# Patient Record
Sex: Female | Born: 1980
Health system: Southern US, Community
[De-identification: ages and names within clinical notes are randomized; demographics above are authoritative.]

## PROBLEM LIST (undated history)

## (undated) DIAGNOSIS — F319 Bipolar disorder, unspecified: Secondary | ICD-10-CM

## (undated) DIAGNOSIS — R519 Headache, unspecified: Secondary | ICD-10-CM

## (undated) DIAGNOSIS — G244 Idiopathic orofacial dystonia: Secondary | ICD-10-CM

## (undated) DIAGNOSIS — O24419 Gestational diabetes mellitus in pregnancy, unspecified control: Secondary | ICD-10-CM

## (undated) DIAGNOSIS — F419 Anxiety disorder, unspecified: Secondary | ICD-10-CM

## (undated) DIAGNOSIS — I10 Essential (primary) hypertension: Secondary | ICD-10-CM

## (undated) DIAGNOSIS — F32A Depression, unspecified: Secondary | ICD-10-CM

## (undated) DIAGNOSIS — F329 Major depressive disorder, single episode, unspecified: Secondary | ICD-10-CM

## (undated) HISTORY — DX: Anxiety disorder, unspecified: F41.9

## (undated) HISTORY — PX: BREAST SURGERY: SHX581

## (undated) HISTORY — DX: Depression, unspecified: F32.A

## (undated) HISTORY — DX: Bipolar disorder, unspecified: F31.9

## (undated) HISTORY — DX: Gestational diabetes mellitus in pregnancy, unspecified control: O24.419

## (undated) HISTORY — DX: Idiopathic orofacial dystonia: G24.4

## (undated) HISTORY — DX: Major depressive disorder, single episode, unspecified: F32.9

## (undated) HISTORY — PX: REDUCTION MAMMAPLASTY: SUR839

---

## 2013-04-09 DIAGNOSIS — E7849 Other hyperlipidemia: Secondary | ICD-10-CM | POA: Diagnosis present

## 2013-04-09 DIAGNOSIS — I10 Essential (primary) hypertension: Secondary | ICD-10-CM | POA: Diagnosis present

## 2014-12-09 DIAGNOSIS — D573 Sickle-cell trait: Secondary | ICD-10-CM | POA: Insufficient documentation

## 2015-03-02 ENCOUNTER — Other Ambulatory Visit (HOSPITAL_COMMUNITY): Payer: Self-pay | Admitting: Obstetrics and Gynecology

## 2015-03-02 DIAGNOSIS — R772 Abnormality of alphafetoprotein: Secondary | ICD-10-CM

## 2015-03-02 DIAGNOSIS — O09522 Supervision of elderly multigravida, second trimester: Secondary | ICD-10-CM

## 2015-03-02 DIAGNOSIS — Z3A23 23 weeks gestation of pregnancy: Secondary | ICD-10-CM

## 2015-03-02 DIAGNOSIS — Z3689 Encounter for other specified antenatal screening: Secondary | ICD-10-CM

## 2015-03-09 ENCOUNTER — Encounter (HOSPITAL_COMMUNITY): Payer: Self-pay | Admitting: Family Medicine

## 2015-03-16 ENCOUNTER — Ambulatory Visit (HOSPITAL_COMMUNITY): Admission: RE | Admit: 2015-03-16 | Payer: Medicaid Other | Source: Ambulatory Visit

## 2015-03-16 ENCOUNTER — Ambulatory Visit (HOSPITAL_COMMUNITY): Payer: Medicaid Other | Attending: Obstetrics and Gynecology

## 2015-03-22 ENCOUNTER — Encounter: Payer: Medicaid Other | Attending: Family Medicine

## 2015-05-20 DIAGNOSIS — O280 Abnormal hematological finding on antenatal screening of mother: Secondary | ICD-10-CM | POA: Insufficient documentation

## 2015-07-21 ENCOUNTER — Inpatient Hospital Stay (HOSPITAL_COMMUNITY): Admission: RE | Admit: 2015-07-21 | Payer: Self-pay | Source: Ambulatory Visit | Admitting: Obstetrics and Gynecology

## 2017-02-10 ENCOUNTER — Ambulatory Visit (INDEPENDENT_AMBULATORY_CARE_PROVIDER_SITE_OTHER): Payer: 59 | Admitting: Behavioral Health

## 2017-02-10 DIAGNOSIS — F259 Schizoaffective disorder, unspecified: Secondary | ICD-10-CM

## 2017-02-13 ENCOUNTER — Ambulatory Visit: Payer: Self-pay | Admitting: Behavioral Health
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
## 2017-02-20 ENCOUNTER — Ambulatory Visit (INDEPENDENT_AMBULATORY_CARE_PROVIDER_SITE_OTHER): Payer: 59 | Admitting: Licensed Clinical Social Worker

## 2017-02-20 DIAGNOSIS — F259 Schizoaffective disorder, unspecified: Secondary | ICD-10-CM

## 2017-03-06 ENCOUNTER — Ambulatory Visit: Payer: 59 | Admitting: Licensed Clinical Social Worker

## 2017-03-06 NOTE — Progress Notes (Signed)
   THERAPIST PROGRESS NOTE  Session Time: 60min  Participation Level: Active  Behavioral Response: CasualAlertAnxious  Type of Therapy: Individual Therapy  Treatment Goals addressed: Coping  Interventions: CBT, Supportive and Reframing  Summary: Kaylee Hill is a 36 y.o. female who presents with continued symptoms of her diagnosis.   LCSW discussed what psychotherapy is and is not and the importance of the therapeutic relationship to include open and honest communication between client and therapist and building trust.  Reviewed advantages and disadvantages of the therapeutic process and limitations to the therapeutic relationship including LCSW's role in maintaining the safety of the client, others and those in client's care.  Assisted Patient with developing her treatment plan.  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  She believes that her co workers are talking about her performance.   Suicidal/Homicidal: No  Therapist Response:  Clinician applied active listening skills to assist Patient develop her treatment plan.  Plan: Return again in 2 weeks.  Diagnosis: Axis I: Schizoaffective Disorder    Axis II: No diagnosis    Marinda Elkicole M Peacock, LCSW 02/21/2017

## 2017-03-24 ENCOUNTER — Ambulatory Visit: Payer: 59 | Admitting: Licensed Clinical Social Worker

## 2017-04-27 ENCOUNTER — Encounter: Payer: Self-pay | Admitting: Emergency Medicine

## 2017-04-27 ENCOUNTER — Emergency Department
Admission: EM | Admit: 2017-04-27 | Discharge: 2017-04-28 | Disposition: A | Discharge status: Other Acute Inpatient Hospital | Payer: 59 | Attending: Emergency Medicine | Admitting: Emergency Medicine

## 2017-04-27 DIAGNOSIS — F319 Bipolar disorder, unspecified: Secondary | ICD-10-CM

## 2017-04-27 DIAGNOSIS — R45851 Suicidal ideations: Secondary | ICD-10-CM | POA: Diagnosis not present

## 2017-04-27 DIAGNOSIS — F209 Schizophrenia, unspecified: Secondary | ICD-10-CM | POA: Insufficient documentation

## 2017-04-27 DIAGNOSIS — I10 Essential (primary) hypertension: Secondary | ICD-10-CM | POA: Diagnosis not present

## 2017-04-27 DIAGNOSIS — F329 Major depressive disorder, single episode, unspecified: Secondary | ICD-10-CM | POA: Diagnosis present

## 2017-04-27 HISTORY — DX: Essential (primary) hypertension: I10

## 2017-04-27 LAB — COMPREHENSIVE METABOLIC PANEL
ALBUMIN: 3.9 g/dL (ref 3.5–5.0)
ALT: 12 U/L — ABNORMAL LOW (ref 14–54)
ANION GAP: 7 (ref 5–15)
AST: 20 U/L (ref 15–41)
Alkaline Phosphatase: 85 U/L (ref 38–126)
BILIRUBIN TOTAL: 0.4 mg/dL (ref 0.3–1.2)
BUN: 8 mg/dL (ref 6–20)
CO2: 25 mmol/L (ref 22–32)
CREATININE: 0.5 mg/dL (ref 0.44–1.00)
Calcium: 9.5 mg/dL (ref 8.9–10.3)
Chloride: 107 mmol/L (ref 101–111)
GFR calc Af Amer: >60 mL/min (ref >60)
Glucose, Bld: 106 mg/dL — ABNORMAL HIGH (ref 65–99)
Potassium: 3.8 mmol/L (ref 3.5–5.1)
Sodium: 139 mmol/L (ref 135–145)
Total Protein: 7.6 g/dL (ref 6.5–8.1)

## 2017-04-27 LAB — CBC
HEMATOCRIT: 34.8 % — AB (ref 35.0–47.0)
HEMOGLOBIN: 11.5 g/dL — AB (ref 12.0–16.0)
MCH: 22.8 pg — ABNORMAL LOW (ref 26.0–34.0)
MCHC: 33 g/dL (ref 32.0–36.0)
MCV: 69 fL — ABNORMAL LOW (ref 80.0–100.0)
Platelets: 272 10*3/uL (ref 150–440)
RBC: 5.04 MIL/uL (ref 3.80–5.20)
RDW: 17.7 % — AB (ref 11.5–14.5)
WBC: 7.3 10*3/uL (ref 3.6–11.0)

## 2017-04-27 LAB — ACETAMINOPHEN LEVEL

## 2017-04-27 LAB — URINE DRUG SCREEN, QUALITATIVE (ARMC ONLY)
AMPHETAMINES, UR SCREEN: NOT DETECTED
BARBITURATES, UR SCREEN: NOT DETECTED
BENZODIAZEPINE, UR SCRN: NOT DETECTED
COCAINE METABOLITE, UR ~~LOC~~: NOT DETECTED
Cannabinoid 50 Ng, Ur ~~LOC~~: NOT DETECTED
MDMA (Ecstasy)Ur Screen: NOT DETECTED
METHADONE SCREEN, URINE: NOT DETECTED
Opiate, Ur Screen: NOT DETECTED
Phencyclidine (PCP) Ur S: NOT DETECTED
TRICYCLIC, UR SCREEN: NOT DETECTED

## 2017-04-27 LAB — SALICYLATE LEVEL: Salicylate Lvl: <7 mg/dL (ref 2.8–30.0)

## 2017-04-27 LAB — POCT PREGNANCY, URINE: Preg Test, Ur: NEGATIVE

## 2017-04-27 LAB — ETHANOL

## 2017-04-27 NOTE — ED Triage Notes (Signed)
Pt states history of bipolar with psychosis. Pt states she is not taking any medication and has difficulty obtaining follow up with psychiatry. Pt states she woke up feeling like she wanted to cut herself. Pt's husband is concerned pt will harm self and is concerned "she had a suicide attempt". Pt denies current suicidal ideation, but then states she felt like harming herself today.

## 2017-04-27 NOTE — ED Notes (Signed)
Pt belongings include shirt, skirt, a pair of shoes and a brown hair clip. Pt belongings are secured in the BHU lockers by this tech.

## 2017-04-27 NOTE — ED Provider Notes (Signed)
[facility] Emergency Department Provider Note   ____________________________________________   I have reviewed the triage vital signs and the nursing notes.   HISTORY  Chief Complaint Psychiatric Evaluation   History limited by: Not Limited   HPI Kaylee Hill is a 36 y.o. female who presents to the emergency department today because of concern for thoughts of self harm. The patient states that she has a history of sleep paralysis. This morning when she woke up she felt like she was seeing hades putting a collar on her. She felt pain with this vision. After she was able to move the patient went to the kitchen and grabbed a knife. Her intent was to end the pain. The husband had to grab the knife from her. By the time of my exam the patient denies any suicidal thoughts or current thoughts of self harm.   Past Medical History:  Diagnosis Date  . Hypertension     There are no active problems to display for this patient.   History reviewed. No pertinent surgical history.  Prior to Admission medications   Not on File    Allergies Patient has no known allergies.  History reviewed. No pertinent family history.  Social History Social History  Substance Use Topics  . Smoking status: Never Smoker  . Smokeless tobacco: Never Used  . Alcohol use No    Review of Systems Constitutional: No fever/chills Eyes: No visual changes. ENT: No sore throat. Cardiovascular: Denies chest pain. Respiratory: Denies shortness of breath. Gastrointestinal: No abdominal pain.  No nausea, no vomiting.  No diarrhea.   Genitourinary: Negative for dysuria. Musculoskeletal: Negative for back pain. Skin: Negative for rash. Neurological: Negative for headaches, focal weakness or numbness.  ____________________________________________   PHYSICAL EXAM:  VITAL SIGNS: ED Triage Vitals  Enc Vitals Group     BP 04/27/17 2022 (!) 164/111     Pulse Rate 04/27/17 2022 (!)  101     Resp 04/27/17 2022 18     Temp 04/27/17 2022 98.9 F (37.2 C)     Temp src --      SpO2 04/27/17 2022 100 %     Weight 04/27/17 2023 250 lb (113.4 kg)     Height 04/27/17 2023 5\' 1"  (1.549 m)     Head Circumference --      Peak Flow --      Pain Score 04/27/17 2022 0   Constitutional: Alert and oriented. Well appearing and in no distress. Eyes: Conjunctivae are normal.  ENT   Head: Normocephalic and atraumatic.   Nose: No congestion/rhinnorhea.   Mouth/Throat: Mucous membranes are moist.   Neck: No stridor. Hematological/Lymphatic/Immunilogical: No cervical lymphadenopathy. Cardiovascular: Normal rate, regular rhythm.  No murmurs, rubs, or gallops.  Respiratory: Normal respiratory effort without tachypnea nor retractions. Breath sounds are clear and equal bilaterally. No wheezes/rales/rhonchi. Gastrointestinal: Soft and non tender. No rebound. No guarding.  Genitourinary: Deferred Musculoskeletal: Normal range of motion in all extremities. No lower extremity edema. Neurologic:  Normal speech and language. No gross focal neurologic deficits are appreciated.  Skin:  Skin is warm, dry and intact. No rash noted. Psychiatric: Calm. Poor eye contact. Denies SI/HI.  ____________________________________________    LABS (pertinent positives/negatives)  Labs Reviewed  COMPREHENSIVE METABOLIC PANEL - Abnormal; Notable for the following:       Result Value   Glucose, Bld 106 (*)    ALT 12 (*)    All other components within normal limits  ACETAMINOPHEN LEVEL - Abnormal; Notable  for the following:    Acetaminophen (Tylenol), Serum <10 (*)    All other components within normal limits  CBC - Abnormal; Notable for the following:    Hemoglobin 11.5 (*)    HCT 34.8 (*)    MCV 69.0 (*)    MCH 22.8 (*)    RDW 17.7 (*)    All other components within normal limits  ETHANOL  SALICYLATE LEVEL  URINE DRUG SCREEN, QUALITATIVE ([facility] ONLY)  POC URINE PREG, ED  POCT  PREGNANCY, URINE     ____________________________________________   EKG  None  ____________________________________________    RADIOLOGY  None  ____________________________________________   PROCEDURES  Procedures  ____________________________________________   INITIAL IMPRESSION / ASSESSMENT AND PLAN / ED COURSE  Pertinent labs & imaging results that were available during my care of the patient were reviewed by me and considered in my medical decision making (see chart for details).  Patient presented to the emergency department today because of concerns for thoughts of self-harm. By the time I examination patient is calm and denies any thoughts of self-harm. Will have patient be evaluated by psychaitry.   ____________________________________________   FINAL CLINICAL IMPRESSION(S) / ED DIAGNOSES  Abnormal behavior  Note: This dictation was prepared with Dragon dictation. Any transcriptional errors that result from this process are unintentional     Phineas Semen, MD 04/27/17 2344

## 2017-04-28 ENCOUNTER — Inpatient Hospital Stay
Admission: AD | Admit: 2017-04-28 | Discharge: 2017-05-02 | DRG: 885 | Disposition: A | Discharge status: Home / Self Care | Payer: 59 | Source: Intra-hospital | Attending: Psychiatry | Admitting: Psychiatry

## 2017-04-28 ENCOUNTER — Encounter: Payer: Self-pay | Admitting: *Deleted

## 2017-04-28 DIAGNOSIS — I10 Essential (primary) hypertension: Secondary | ICD-10-CM | POA: Diagnosis present

## 2017-04-28 DIAGNOSIS — R45851 Suicidal ideations: Secondary | ICD-10-CM | POA: Diagnosis present

## 2017-04-28 DIAGNOSIS — Z7984 Long term (current) use of oral hypoglycemic drugs: Secondary | ICD-10-CM

## 2017-04-28 DIAGNOSIS — F319 Bipolar disorder, unspecified: Principal | ICD-10-CM | POA: Diagnosis present

## 2017-04-28 DIAGNOSIS — E7849 Other hyperlipidemia: Secondary | ICD-10-CM | POA: Diagnosis present

## 2017-04-28 DIAGNOSIS — R7303 Prediabetes: Secondary | ICD-10-CM | POA: Diagnosis present

## 2017-04-28 DIAGNOSIS — Z79899 Other long term (current) drug therapy: Secondary | ICD-10-CM | POA: Diagnosis not present

## 2017-04-28 DIAGNOSIS — E784 Other hyperlipidemia: Secondary | ICD-10-CM | POA: Diagnosis present

## 2017-04-28 DIAGNOSIS — E119 Type 2 diabetes mellitus without complications: Secondary | ICD-10-CM

## 2017-04-28 DIAGNOSIS — Z818 Family history of other mental and behavioral disorders: Secondary | ICD-10-CM | POA: Diagnosis not present

## 2017-04-28 DIAGNOSIS — F209 Schizophrenia, unspecified: Secondary | ICD-10-CM | POA: Diagnosis not present

## 2017-04-28 MED ORDER — ARIPIPRAZOLE 10 MG PO TABS
10.0000 mg | ORAL_TABLET | Freq: Every day | ORAL | Status: DC
  Administered 2017-04-28: 10 mg via ORAL
  Filled 2017-04-28: qty 1

## 2017-04-28 MED ORDER — CITALOPRAM HYDROBROMIDE 20 MG PO TABS
20.0000 mg | ORAL_TABLET | Freq: Every day | ORAL | Status: DC
  Administered 2017-04-28: 20 mg via ORAL
  Filled 2017-04-28: qty 1

## 2017-04-28 MED ORDER — ALUM & MAG HYDROXIDE-SIMETH 200-200-20 MG/5ML PO SUSP: 30.0000 mL | ORAL | Status: DC | PRN

## 2017-04-28 MED ORDER — LORAZEPAM 1 MG PO TABS
1.0000 mg | ORAL_TABLET | Freq: Once | ORAL | Status: AC
  Administered 2017-04-28: 1 mg via ORAL
  Filled 2017-04-28: qty 1

## 2017-04-28 MED ORDER — ARIPIPRAZOLE 10 MG PO TABS
10.0000 mg | ORAL_TABLET | Freq: Every day | ORAL | Status: DC
  Administered 2017-04-29: 10 mg via ORAL
  Filled 2017-04-28: qty 1

## 2017-04-28 MED ORDER — CITALOPRAM HYDROBROMIDE 20 MG PO TABS
20.0000 mg | ORAL_TABLET | Freq: Every day | ORAL | Status: DC
  Administered 2017-04-29: 20 mg via ORAL
  Filled 2017-04-28: qty 1

## 2017-04-28 MED ORDER — METFORMIN HCL 500 MG PO TABS: 500.0000 mg | ORAL_TABLET | Freq: Every day | ORAL | Status: DC

## 2017-04-28 MED ORDER — METFORMIN HCL 500 MG PO TABS
500.0000 mg | ORAL_TABLET | Freq: Every day | ORAL | Status: DC
  Administered 2017-04-29 – 2017-04-30 (×2): 500 mg via ORAL
  Filled 2017-04-28 (×2): qty 1

## 2017-04-28 MED ORDER — MAGNESIUM HYDROXIDE 400 MG/5ML PO SUSP: 30.0000 mL | Freq: Every day | ORAL | Status: DC | PRN

## 2017-04-28 MED ORDER — ACETAMINOPHEN 325 MG PO TABS: 650.0000 mg | ORAL_TABLET | Freq: Four times a day (QID) | ORAL | Status: DC | PRN

## 2017-04-28 NOTE — [facility] Group Notes (Signed)
[facility] Group Notes:  (Nursing/MHT/Case Management/Adjunct)  Date:  04/28/2017  Time:  9:16 PM  Type of Therapy:  Group Therapy  Participation Level:  Active  Participation Quality:  Appropriate  Affect:  Appropriate  Cognitive:  Alert  Insight:  Good  Engagement in Group:  Engaged  Modes of Intervention:  Support  Summary of Progress/Problems:  Kaylee Hill 04/28/2017, 9:16 PM

## 2017-04-28 NOTE — ED Provider Notes (Signed)
-----------------------------------------   1:54 AM on 04/28/2017 -----------------------------------------   Blood pressure (!) 149/90, pulse 90, temperature 98.9 F (37.2 C), resp. rate 16, height 5\' 1"  (1.549 m), weight 113.4 kg (250 lb), SpO2 100 %.  Patient was evaluated by [facility] psychiatrist Dr. Hermelinda Medicus who recommends inpatient psychiatry admission. Patient will maintain under voluntary status.  Disposition is pending Psychiatry/Behavioral Medicine team recommendations.     Irean Hong, MD 04/28/17 0700

## 2017-04-28 NOTE — BH Assessment (Signed)
Pt pending BMU bed assignment. 

## 2017-04-28 NOTE — ED Notes (Signed)
Pt reports to nursing stating felt like she was going to have a panic attack or break something. Md notified, new order received.
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
## 2017-04-28 NOTE — ED Notes (Signed)
Patient laying in bed with eyes closed. Respirations even and unlabored.

## 2017-04-28 NOTE — Tx Team (Signed)
Initial Treatment Plan 04/28/2017 4:25 PM Kaylee Hill RAQ:[id number]    PATIENT STRESSORS: Marital or family conflict Medication change or noncompliance Occupational concerns   PATIENT STRENGTHS: Average or above average intelligence Communication skills Financial means Supportive family/friends Work skills   PATIENT IDENTIFIED PROBLEMS: Depression  Anxiety  "I feel overwhelmed"  "I'm stressed at work"  History of psychosis  Suicidal risk           DISCHARGE CRITERIA:  Motivation to continue treatment in a less acute level of care Need for constant or close observation no longer present Reduction of life-threatening or endangering symptoms to within safe limits Verbal commitment to aftercare and medication compliance  PRELIMINARY DISCHARGE PLAN: Outpatient therapy Return to previous living arrangement Return to previous work or school arrangements  PATIENT/FAMILY INVOLVEMENT: This treatment plan has been presented to and reviewed with the patient, Kaylee Hill.  The patient and family have been given the opportunity to ask questions and make suggestions.  Cranford Mon, RN 04/28/2017, 4:25 PM

## 2017-04-28 NOTE — Progress Notes (Signed)
Admission note:  Patient is a 36 yo female admitted with suicidal ideation.  Patient states she was taking a nap on the couch and her husband was watching TV on the other side.  She states she woke up in what she described as "sleep paralysis."  Patient states that her husband could not tell that "I was having trouble breathing and moving.  He just kept sitting there."  Patient states she got upset over this and went to the kitchen and retrieved a knife "to cut my wrists."  Patient has a 68 month old daughter at home and works as a Associate Professor at [employer].  She states her job is very stressful and her mood has been unstable.  She denies any drug use and "hardly ever drinks."  Her UDS was negative.  Patient also began to have some dangerous thoughts about acting out at work.  She states she used to take celexa for anxiety and depression "but I haven't had it in a couple of days."  Patient's medical hx includes borderline diabetes (takes metformin) and HTN (no BP medications).  Patient lives in [place] with her husband and daughter.  She states her husband is supportive.  She has had a couple of psyche admissions prior to this one in [place].  She also states that she has had "auditory hallucinations in the past telling me to kill myself."  She states this current time she "heard one voice saying take the pain away."  She states she suffers from "panic attacks."  She had an attack in the ED prior to admission and was given 1 mg ativan.  She came to nurse's station a few minutes ago and states "I don't know what they gave me, but I'm nauseated and dizzy."  Patient was given ginger ale and advised to lay down until dinner.

## 2017-04-28 NOTE — ED Notes (Signed)
Pt. To BHU from ED ambulatory without difficulty, to room  BHU 8. Report from April RN. Pt. Is alert and oriented, warm and dry in no distress. Pt. Denies SI, HI, and AVH. Pt. Calm and cooperative. Pt. Made aware of security cameras and Q15 minute rounds. Pt. Encouraged to let Nursing staff know of any concerns or needs.

## 2017-04-28 NOTE — Consult Note (Signed)
[facility] Consult   Reason for Consult:  Consult for 36 year old woman with a history of mental health problems who came voluntarily to the hospital because of mood instability Referring Physician:  Corky Downs Patient Identification: Kaylee Hill MRN:  [id number] Principal Diagnosis: Bipolar disorder with psychotic features [facility]) Diagnosis:   Patient Active Problem List   Diagnosis Date Noted  . Bipolar disorder with psychotic features ([place]) [F31.9] 04/28/2017    Total Time spent with patient: 1 hour  Subjective:   Kaylee Hill is a 36 y.o. female patient admitted with "last night I almost killed myself".  HPI:  Patient interviewed chart reviewed. 36 year old woman reports that last night she was taking a nap when she had a nightmare. She woke up from it and still did not feel right. Felt like her mind was not thinking clearly. She got up and went to the kitchen and got a steak knife and went to the bathroom to cut her wrists. Fortunately her husband interrupted her along the way. Patient reports that recently her mood has been "a roller coaster". Been bad and even worse for the last month. Mood feels depressed and anxious most of the time. Night sleep is generally okay although she has been having more sleepiness during the day and will frequently suffer sleep paralysis. She has been having some suicidal thoughts. Patient feels overwhelmed by stress including taking care of her young daughter and even more so from her work. She says she has had paranoid thoughts about work thinking people there were against her or talking about her. She has begun to have some dangerous thoughts about acting out at work. She admits that she occasionally has auditory hallucinations. She is not currently on any psychiatric medicine and has only seen a therapist once recently. Denies that she's been drinking or using drugs.  Medical history: Says that she is supposed to take a modest dose of metformin  for "prediabetes" otherwise seems to be in good medical health.  Social history: Patient is married lives with her husband and a daughter who is about 87 months old. 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  Substance abuse history: Patient says she drinks only occasionally. Thinks that in college and may have been a problem but denies it being a problem currently and denies other drug abuse.  Past Psychiatric History: Patient has had at least 2 previous psychiatric hospitalizations in [place]. Has been diagnosed variously as depression and now bipolar disorder with paranoia. Possible schizoaffective disorder. Paranoia and hallucinations have been documented in the past. She has had serious suicidal thoughts in the past. No history of violence to others. Most recent medicines were Celexa and Abilify and possibly bupropion but recently she has only been taking the Celexa and hasn't been on that very regularly.  Risk to Self: Suicidal Ideation: No Suicidal Intent: No Is patient at risk for suicide?: No Suicidal Plan?: No What has been your use of drugs/alcohol within the last 12 months?: none How many times?: 5 Other Self Harm Risks: none Triggers for Past Attempts: Unpredictable Intentional Self Injurious Behavior: None Risk to Others: Homicidal Ideation: No Thoughts of Harm to Others: No Current Homicidal Intent: No Current Homicidal Plan: No Access to Homicidal Means: No Identified Victim: NA History of harm to others?: No Assessment of Violence: None Noted Violent Behavior Description: none Does patient have access to weapons?: No Criminal Charges Pending?: No Does patient have a court date: No Prior Inpatient Therapy:  Prior Inpatient Therapy: Yes Prior Therapy Dates: 2012 Prior Therapy Facilty/Provider(s): hospital in [place], [place] Reason for Treatment: SI Prior Outpatient Therapy: Prior Outpatient Therapy: Yes Prior Therapy Dates: July  2018 Prior Therapy Facilty/Provider(s): [place] Reason for Treatment: hallucinations Does patient have an ACCT team?: No Does patient have Intensive In-House Services?  : No Does patient have [facility] services? : No Does patient have P4CC services?: No  Past Medical History:  Past Medical History:  Diagnosis Date  . Hypertension    History reviewed. No pertinent surgical history. Family History: History reviewed. No pertinent family history. Family Psychiatric  History: She had a brother who attempted suicide unsuccessfully. Her mother also is described as having depression problems Social History:  History  Alcohol Use No     History  Drug Use No    Social History   Social History  . Marital status: Married    Spouse name: N/A  . Number of children: N/A  . Years of education: N/A   Social History Main Topics  . Smoking status: Never Smoker  . Smokeless tobacco: Never Used  . Alcohol use No  . Drug use: No  . Sexual activity: Not Asked   Other Topics Concern  . None   Social History Narrative  . None   Additional Social History:    Allergies:  No Known Allergies  Labs:  Results for orders placed or performed during the hospital encounter of 04/27/17 (from the past 48 hour(s))  Comprehensive metabolic panel     Status: Abnormal   Collection Time: 04/27/17  8:38 PM  Result Value Ref Range   Sodium 139 135 - 145 mmol/L   Potassium 3.8 3.5 - 5.1 mmol/L   Chloride 107 101 - 111 mmol/L   CO2 25 22 - 32 mmol/L   Glucose, Bld 106 (H) 65 - 99 mg/dL   BUN 8 6 - 20 mg/dL   Creatinine, Ser 0.50 0.44 - 1.00 mg/dL   Calcium 9.5 8.9 - 10.3 mg/dL   Total Protein 7.6 6.5 - 8.1 g/dL   Albumin 3.9 3.5 - 5.0 g/dL   AST 20 15 - 41 U/L   ALT 12 (L) 14 - 54 U/L   Alkaline Phosphatase 85 38 - 126 U/L   Total Bilirubin 0.4 0.3 - 1.2 mg/dL   GFR calc non Af Amer >60 >60 mL/min   GFR calc Af Amer >60 >60 mL/min    Comment: (NOTE) The eGFR has  been calculated using the CKD EPI equation. This calculation has not been validated in all clinical situations. eGFR's persistently <60 mL/min signify possible Chronic Kidney Disease.    Anion gap 7 5 - 15  Ethanol     Status: None   Collection Time: 04/27/17  8:38 PM  Result Value Ref Range   Alcohol, Ethyl (B) <5 <5 mg/dL    Comment:        LOWEST DETECTABLE LIMIT FOR SERUM ALCOHOL IS 5 mg/dL FOR MEDICAL PURPOSES ONLY   Salicylate level     Status: None   Collection Time: 04/27/17  8:38 PM  Result Value Ref Range   Salicylate Lvl <4.5 2.8 - 30.0 mg/dL  Acetaminophen level     Status: Abnormal   Collection Time: 04/27/17  8:38 PM  Result Value Ref Range   Acetaminophen (Tylenol), Serum <10 (L) 10 - 30 ug/mL    Comment:        THERAPEUTIC CONCENTRATIONS VARY SIGNIFICANTLY. A RANGE OF 10-30 ug/mL MAY  BE AN EFFECTIVE CONCENTRATION FOR MANY PATIENTS. HOWEVER, SOME ARE BEST TREATED AT CONCENTRATIONS OUTSIDE THIS RANGE. ACETAMINOPHEN CONCENTRATIONS >150 ug/mL AT 4 HOURS AFTER INGESTION AND >50 ug/mL AT 12 HOURS AFTER INGESTION ARE OFTEN ASSOCIATED WITH TOXIC REACTIONS.   cbc     Status: Abnormal   Collection Time: 04/27/17  8:38 PM  Result Value Ref Range   WBC 7.3 3.6 - 11.0 K/uL   RBC 5.04 3.80 - 5.20 MIL/uL   Hemoglobin 11.5 (L) 12.0 - 16.0 g/dL   HCT 34.8 (L) 35.0 - 47.0 %   MCV 69.0 (L) 80.0 - 100.0 fL   MCH 22.8 (L) 26.0 - 34.0 pg   MCHC 33.0 32.0 - 36.0 g/dL   RDW 17.7 (H) 11.5 - 14.5 %   Platelets 272 150 - 440 K/uL  Urine Drug Screen, Qualitative     Status: None   Collection Time: 04/27/17  8:38 PM  Result Value Ref Range   Tricyclic, Ur Screen NONE DETECTED NONE DETECTED   Amphetamines, Ur Screen NONE DETECTED NONE DETECTED   MDMA (Ecstasy)Ur Screen NONE DETECTED NONE DETECTED   Cocaine Metabolite,Ur [place] NONE DETECTED NONE DETECTED   Opiate, Ur Screen NONE DETECTED NONE DETECTED   Phencyclidine (PCP) Ur S NONE DETECTED NONE DETECTED   Cannabinoid 50 Ng,  Ur [place] NONE DETECTED NONE DETECTED   Barbiturates, Ur Screen NONE DETECTED NONE DETECTED   Benzodiazepine, Ur Scrn NONE DETECTED NONE DETECTED   Methadone Scn, Ur NONE DETECTED NONE DETECTED    Comment: (NOTE) 161  Tricyclics, urine               Cutoff 1000 ng/mL 200  Amphetamines, urine             Cutoff 1000 ng/mL 300  MDMA (Ecstasy), urine           Cutoff 500 ng/mL 400  Cocaine Metabolite, urine       Cutoff 300 ng/mL 500  Opiate, urine                   Cutoff 300 ng/mL 600  Phencyclidine (PCP), urine      Cutoff 25 ng/mL 700  Cannabinoid, urine              Cutoff 50 ng/mL 800  Barbiturates, urine             Cutoff 200 ng/mL 900  Benzodiazepine, urine           Cutoff 200 ng/mL 1000 Methadone, urine                Cutoff 300 ng/mL 1100 1200 The urine drug screen provides only a preliminary, unconfirmed 1300 analytical test result and should not be used for non-medical 1400 purposes. Clinical consideration and professional judgment should 1500 be applied to any positive drug screen result due to possible 1600 interfering substances. A more specific alternate chemical method 1700 must be used in order to obtain a confirmed analytical result.  1800 Gas chromato graphy / mass spectrometry (GC/MS) is the preferred 1900 confirmatory method.   Pregnancy, urine POC     Status: None   Collection Time: 04/27/17  8:51 PM  Result Value Ref Range   Preg Test, Ur NEGATIVE NEGATIVE    Comment:        THE SENSITIVITY OF THIS METHODOLOGY IS >24 mIU/mL     Current Facility-Administered Medications  Medication Dose Route Frequency Provider Last Rate Last Dose  . ARIPiprazole (ABILIFY) tablet 10  mg  10 mg Oral Daily Tanaka Gillen T, MD      . citalopram (CELEXA) tablet 20 mg  20 mg Oral Daily Trinia Georgi T, MD      . Derrill Memo ON 04/29/2017] metFORMIN (GLUCOPHAGE) tablet 500 mg  500 mg Oral Q breakfast Syanna Remmert, Madie Ren[place], MD       Current Outpatient Prescriptions  Medication Sig Dispense  Refill  . citalopram (CELEXA) 40 MG tablet Take 20 mg by mouth daily.       Musculoskeletal: Strength & Muscle Tone: within normal limits Gait & Station: normal Patient leans: N/A  Psychiatric Specialty Exam: Physical Exam  Nursing note and vitals reviewed. Constitutional: She appears well-developed and well-nourished.  HENT:  Head: Normocephalic and atraumatic.  Eyes: Pupils are equal, round, and reactive to light. Conjunctivae are normal.  Neck: Normal range of motion.  Cardiovascular: Regular rhythm and normal heart sounds.   Respiratory: Effort normal. No respiratory distress.  GI: Soft.  Musculoskeletal: Normal range of motion.  Neurological: She is alert.  Skin: Skin is warm and dry.  Psychiatric: Judgment normal. Her affect is blunt. Her speech is delayed. She is slowed and withdrawn. Thought content is paranoid. Cognition and memory are normal. She exhibits a depressed mood. She expresses suicidal ideation. She expresses suicidal plans.    ROS  Blood pressure 134/82, pulse 66, temperature 98.6 F (37 C), temperature source Oral, resp. rate 18, height _0  (1.549 m), weight 113.4 kg (250 lb), SpO2 100 %.Body mass index is 47.24 kg/m.  General Appearance: Casual  Eye Contact:  Minimal  Speech:  Slow  Volume:  Decreased  Mood:  Anxious and Depressed  Affect:  Congruent  Thought Process:  Goal Directed  Orientation:  Full (Time, Place, and Person)  Thought Content:  Paranoid Ideation and Rumination  Suicidal Thoughts:  Yes.  with intent/plan  Homicidal Thoughts:  No  Memory:  Immediate;   Good Recent;   Fair Remote;   Fair  Judgement:  Fair  Insight:  Good  Psychomotor Activity:  Decreased  Concentration:  Concentration: Fair  Recall:  [employer] of Knowledge:  Fair  Language:  Fair  Akathisia:  No  Handed:  Right  AIMS (if indicated):     Assets:  Communication Skills Desire for Improvement Financial Resources/Insurance Housing Physical  Health Resilience  ADL's:  Intact  Cognition:  WNL  Sleep:        Treatment Plan Summary: Daily contact with patient to assess and evaluate symptoms and progress in treatment, Medication management and Plan See note below note below  Disposition: Daily contact with patient to assess and evaluate symptoms and progress in treatment, Medication management and Plan This 36 year old woman who has symptoms of depression with intermittent psychosis and recent suicidal ideation. Has had 2 previous hospitalizations with documented psychotic problems but is not currently on appropriate medicine. Patient was not functioning well at work or at home. I recommend inpatient hospitalization for stabilization and the patient agrees. Restart Celexa and Abilify. 12-lead EKG will be done. 15 minute checks in place. Labs. Case reviewed with TTS and emergency room doctor  Alethia Berthold, MD 04/28/2017 12:23 PM

## 2017-04-28 NOTE — ED Notes (Signed)

## 2017-04-28 NOTE — ED Notes (Signed)
Report to margaret, rn.  

## 2017-04-28 NOTE — ED Notes (Signed)
Pt presents with sad, flat affect. Denies SI, HI, AVH. In room, did eat breakfast. Minimal interaction with staff or peers. Isolative to self, guarded. Meal and fluid given to patient. Will continue to assess and monitor with q 15 min checks.

## 2017-04-28 NOTE — ED Notes (Signed)
SOC in progress.  

## 2017-04-28 NOTE — ED Notes (Signed)
TTS assessing patient. 

## 2017-04-28 NOTE — ED Notes (Addendum)
Patient laying in bed with eyes closed. Respirations even and unlabored.

## 2017-04-28 NOTE — ED Notes (Signed)
TTS assessment complete. 

## 2017-04-29 DIAGNOSIS — F319 Bipolar disorder, unspecified: Principal | ICD-10-CM

## 2017-04-29 LAB — LIPID PANEL
CHOL/HDL RATIO: 5.9 ratio
Cholesterol: 182 mg/dL (ref 0–200)
HDL: 31 mg/dL — ABNORMAL LOW (ref >40)
LDL CALC: 112 mg/dL — AB (ref 0–99)
Triglycerides: 195 mg/dL — ABNORMAL HIGH (ref <150)
VLDL: 39 mg/dL (ref 0–40)

## 2017-04-29 LAB — TSH: TSH: 0.899 u[IU]/mL (ref 0.350–4.500)

## 2017-04-29 MED ORDER — ONDANSETRON 4 MG PO TBDP
4.0000 mg | ORAL_TABLET | Freq: Three times a day (TID) | ORAL | Status: DC | PRN
  Administered 2017-04-30: 4 mg via ORAL
  Filled 2017-04-29: qty 1

## 2017-04-29 MED ORDER — ARIPIPRAZOLE 10 MG PO TABS: 10.0000 mg | ORAL_TABLET | Freq: Every day | ORAL | Status: DC

## 2017-04-29 MED ORDER — CITALOPRAM HYDROBROMIDE 20 MG PO TABS
10.0000 mg | ORAL_TABLET | Freq: Every day | ORAL | Status: DC
  Administered 2017-04-30 – 2017-05-01 (×2): 10 mg via ORAL
  Filled 2017-04-29 (×2): qty 1

## 2017-04-29 MED ORDER — ONDANSETRON 4 MG PO TBDP: 8.0000 mg | ORAL_TABLET | Freq: Once | ORAL | Status: DC

## 2017-04-29 NOTE — Progress Notes (Signed)
Patient ID: Kaylee Hill, female   DOB: 1981-04-12, 36 y.o.   MRN: [id number] Pleasant on approach, neat and unremarkable in street clothes, denied pain, denied SI/HI/SIB/AVH; described her conditions as postpartum depression with suicidal ideation, "I am good right now and I don't feel like killing myself; they just gave [place] a rate at [employer] and then cut back hours, so it becomes stressful .Marland Kitchen.." Visibile in the day room, interacting well with peers, attended the wrap-up group.

## 2017-04-29 NOTE — Plan of Care (Signed)
Problem: Activity: Goal: Sleeping patterns will improve Outcome: Progressing Patient slept for Estimated Hours of 6.15; Precautionary checks every 15 minutes for safety maintained, room free of safety hazards, patient sustains no injury or falls during this shift.    

## 2017-04-29 NOTE — [facility] Suicide Risk Assessment (Signed)
[facility] Admission Suicide Risk Assessment   Nursing information obtained from:    Demographic factors:    Current Mental Status:    Loss Factors:    Historical Factors:    Risk Reduction Factors:     Total Time spent with patient: 1 hour Principal Problem: Bipolar disorder with psychotic features [facility]) Diagnosis:   Patient Active Problem List   Diagnosis Date Noted  . Bipolar disorder with psychotic features (HCC) [F31.9] 04/28/2017  . Benign essential hypertension [I10] 04/09/2013  . Familial multiple lipoprotein-type hyperlipidemia [E78.4] 04/09/2013   Subjective Data:   Continued Clinical Symptoms:  Alcohol Use Disorder Identification Test Final Score (AUDIT): 1 The "Alcohol Use Disorders Identification Test", Guidelines for Use in Primary Care, Second Edition.  World Science writer [facility]). Score between 0-7:  no or low risk or alcohol related problems. Score between 8-15:  moderate risk of alcohol related problems. Score between 16-19:  high risk of alcohol related problems. Score 20 or above:  warrants further diagnostic evaluation for alcohol dependence and treatment.   CLINICAL FACTORS:   Bipolar Disorder:   Depressive phase Previous Psychiatric Diagnoses and Treatments     Psychiatric Specialty Exam: Physical Exam  Constitutional: She is oriented to person, place, and time. She appears well-developed and well-nourished.  HENT:  Head: Normocephalic and atraumatic.  Eyes: Conjunctivae and EOM are normal.  Neck: Normal range of motion.  Respiratory: Effort normal.  Musculoskeletal: Normal range of motion.  Neurological: She is alert and oriented to person, place, and time.    Review of Systems  Constitutional: Negative.   HENT: Negative.   Eyes: Negative.   Respiratory: Negative.   Cardiovascular: Negative.   Gastrointestinal: Negative.   Genitourinary: Negative.   Musculoskeletal: Negative.   Skin: Negative.   Neurological: Negative.    Endo/Heme/Allergies: Negative.   Psychiatric/Behavioral: Positive for depression. Negative for hallucinations, memory loss, substance abuse and suicidal ideas. The patient is not nervous/anxious and does not have insomnia.     Blood pressure 134/90, pulse 92, temperature 98.8 F (37.1 C), temperature source Oral, resp. rate 18, height 5\' 1"  (1.549 m), weight 113.4 kg (250 lb), SpO2 100 %.Body mass index is 47.24 kg/m.                                                    Sleep:  Number of Hours: 6.15      COGNITIVE FEATURES THAT CONTRIBUTE TO RISK:  None    SUICIDE RISK:   Moderate:  Frequent suicidal ideation with limited intensity, and duration, some specificity in terms of plans, no associated intent, good self-control, limited dysphoria/symptomatology, some risk factors present, and identifiable protective factors, including available and accessible social support.  PLAN OF CARE: admit  I certify that inpatient services furnished can reasonably be expected to improve the patient's condition.   Jimmy Footman, MD 04/29/2017, 2:17 PM

## 2017-04-29 NOTE — [facility] Group Notes (Signed)
[facility] LCSW Group Therapy Note  Date/Time: 04/29/2017, 9:30 AM  Type of Therapy/Topic:  Group Therapy:  Feelings about Diagnosis  Participation Level:  Active   Mood: Appropriate    Description of Group:    This group will allow patients to explore their thoughts and feelings about diagnoses they have received. Patients will be guided to explore their level of understanding and acceptance of these diagnoses. Facilitator will encourage patients to process their thoughts and feelings about the reactions of others to their diagnosis, and will guide patients in identifying ways to discuss their diagnosis with significant others in their lives. This group will be process-oriented, with patients participating in exploration of their own experiences as well as giving and receiving support and challenge from other group members.   Therapeutic Goals: 1. Patient will demonstrate understanding of diagnosis as evidence by identifying two or more symptoms of the disorder:  2. Patient will be able to express two feelings regarding the diagnosis 3. Patient will demonstrate ability to communicate their needs through discussion and/or role plays  Summary of Patient Progress: Patient identified feelings around her diagnosis and what her diagnosis means. She verbalized understanding and accepted her diagnosis. Patient stated that she is appreciative of the help she has received overtime and is hopeful that as treatment progresses, recovery will continue to be manageable.      Therapeutic Modalities:   Cognitive Behavioral Therapy Brief Therapy Feelings Identification   Hampton Abbot, MSW, LCSW-A 04/29/2017, 3:12PM

## 2017-04-29 NOTE — Progress Notes (Signed)
Recreation Therapy Notes  Date: 08.28.18 Time: 3:00 pm Location: Craft Room  Group Topic: Self-expression  Goal Area(s) Addresses:  Patient will be able to identify a color that represents each emotion. Patient will verbalize benefit of using art as a means of self-expression. Patient will verbalize one emotion experienced while participating in activity.  Behavioral Response: Attentive, Interactive  Intervention: The Colors Within Me  Activity: Patients were given a blank face worksheet. Patients were instructed to pick a color for each emotion they were feeling and to show on the worksheet how much of that emotion they were feeling.  Education: LRT educated patients on other forms of self-expression.  Education Outcome: Acknowledges education/In group clarification offered   Clinical Observations/Feedback: Patient picked a color for each emotion she was feeling and showed on the worksheet how much of that emotion she was feeling. Patient contributed to group discussion by stating what emotions she was feeling, how her emotion affect her treatment in the hospital, that it was helpful to see her emotions on paper and why, and what steps she can take to change her emotions to more positive ones.  Jacquelynn Cree, LRT/CTRS 04/29/2017 3:41 PM
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
## 2017-04-29 NOTE — Plan of Care (Signed)
Problem: Activity: Goal: Interest or engagement in activities will improve Outcome: Progressing Patient attended group activities.

## 2017-04-29 NOTE — [facility] Suicide Risk Assessment (Signed)
[facility] INPATIENT:  Family/Significant Other Suicide Prevention Education  Suicide Prevention Education:  Education Completed; husband, Jeanann Ansah ph#: [phone] has been identified by the patient as the family member/significant other with whom the patient will be residing, and identified as the person(s) who will aid the patient in the event of a mental health crisis (suicidal ideations/suicide attempt).  With written consent from the patient, the family member/significant other has been provided the following suicide prevention education, prior to the and/or following the discharge of the patient.  The suicide prevention education provided includes the following:  Suicide risk factors  Suicide prevention and interventions  National Suicide Hotline telephone number  [facility] assessment telephone number  [facility] Emergency Assistance 911  [facility] and/or Residential Mobile Crisis Unit telephone number  Request made of family/significant other to:  Remove weapons (e.g., guns, rifles, knives), all items previously/currently identified as safety concern.    Remove drugs/medications (over-the-counter, prescriptions, illicit drugs), all items previously/currently identified as a safety concern.  The family member/significant other verbalizes understanding of the suicide prevention education information provided.  The family member/significant other agrees to remove the items of safety concern listed above.  Lynden Oxford, MSW, LCSW-A 04/29/2017, 3:12 PM

## 2017-04-29 NOTE — Plan of Care (Signed)
Problem: Activity: Goal: Interest or engagement in leisure activities will improve Outcome: Progressing Verbalized that she feels better. Goal: Imbalance in normal sleep/wake cycle will improve Outcome: Progressing Attended group activities.  Problem: Coping: Goal: Ability to cope will improve Outcome: Progressing Appropriate with staff & peers.  Problem: Safety: Goal: Ability to disclose and discuss suicidal ideas will improve Outcome: Progressing Safety maintained.

## 2017-04-29 NOTE — Progress Notes (Signed)
Recreation Therapy Notes  INPATIENT RECREATION THERAPY ASSESSMENT  Patient Details Name: Kaylee Hill MRN: [id number] DOB: 22-Aug-1981 Today's Date: 04/29/2017  Patient Stressors: Work, Other (Comment) (Pt reported work is incredible stressful; finances; worries about her daughter)  Coping Skills:   Isolate, Exercise, Art/Dance, Talking, Music, Sports, Other (Comment) (Breathing)  Personal Challenges: Anger, Communication, Concentration, Problem-Solving, Self-Esteem/Confidence, Stress Management, Time Management, Work Nutritional therapist (2+):  Individual - Reading, Individual - Other (Comment) (Play with daughter)  Awareness of Community Resources:  No  Community Resources:     Current Use:    If no, Barriers?:    Patient Strengths:  Mom, can admit  when she needs help  Patient Identified Areas of Improvement:  Controlling her anger, stress management, time management  Current Recreation Participation:  Coloring app on her phone, playing on phone  Patient Goal for Hospitalization:  To get back on track  [place] of Residence:  [place] of Residence:  [place]   Current SI (including self-harm):  No  Current HI:  No  Consent to Intern Participation: N/A   Jacquelynn Cree, LRT/CTRS 04/29/2017, 4:11 PM

## 2017-04-29 NOTE — Progress Notes (Signed)
Patient attended afternoon group led by Chaplain. Patient participated and was polite. Patient asked questions about topic being discussed (Miscommunication and Misunderstanding).    04/29/17 1300  Clinical Encounter Type  Visited With Patient;Health care provider  Visit Type Spiritual support  Referral From Patient;Chaplain   

## 2017-04-29 NOTE — H&P (Signed)
Psychiatric Admission Assessment Adult  Patient Identification: Kaylee Hill MRN:  [id number] Date of Evaluation:  04/29/2017 Chief Complaint:  Bipolar Disorder Principal Diagnosis: Bipolar disorder with psychotic features [facility]) Diagnosis:   Patient Active Problem List   Diagnosis Date Noted  . Bipolar disorder with psychotic features (HCC) [F31.9] 04/28/2017  . Benign essential hypertension [I10] 04/09/2013  . Familial multiple lipoprotein-type hyperlipidemia [E78.4] 04/09/2013   History of Present Illness:   Patient is a 36 year old married African-American female with prior history of bipolar disorder. Patient presented voluntarily in the company of her husband to our emergency department on August 26 due to depression and suicidal ideation.  Patient has been without psychiatric treatment for about 2 years. She has a daughter who is 71 months old and since she got pregnant she has not follow-up with psychiatry.    Patient states that during those 2 years she has been paranoid. She feels that people at work are talking about her behind her back, she constantly feels that her job is in jeopardy. She frequently feels that she is not good enough and that somebody is out to get her.   For the last month she has been more depressed. She started hearing voices about 2 days prior to admission. The voices were instructing her to harm herself. The voices sound like her own voice but distorted. Patient says she locked herself in the bathroom with a knife but her husband stopped her.  Patient says that in the past people at work have told her that she speaks too fast, she says that recently prior to admission she was staying up to 2-3 AM in the morning making guacamole, did that for several nights.  Patient denies the use of any drugs or alcohol.  She has been hospitalized before and diagnosed with bipolar disorder. She recalls being treated effectively with a combination of Abilify, Celexa and  Wellbutrin.     Associated Signs/Symptoms: Depression Symptoms:  depressed mood, (Hypo) Manic Symptoms:  denies Anxiety Symptoms:  Excessive Worry, Psychotic Symptoms:  Hallucinations: Auditory PTSD Symptoms: NA Total Time spent with patient: 1 hour  Past Psychiatric History: Patient was hospitalized in [place] for the first time in 2006. At that time she was diagnosed with major depressive disorder with psychotic features. She was hearing voices that were telling her to kill herself. She was then hospitalized a few years later again in [place] at that time she was diagnosed for bipolar disorder with psychotic features.  Patient has had man near suicide attempts before. The first time whereas in high school when she tried to overdose on medications she holds her brother but never took them, she also has held razor blades to cut but was not able to do it.  Is the patient at risk to self? Yes.    Has the patient been a risk to self in the past 6 months? No.  Has the patient been a risk to self within the distant past? No.  Is the patient a risk to others? No.  Has the patient been a risk to others in the past 6 months? No.  Has the patient been a risk to others within the distant past? No.    Alcohol Screening: 1. How often do you have a drink containing alcohol?: Monthly or less 2. How many drinks containing alcohol do you have on a typical day when you are drinking?: 1 or 2 3. How often do you have six or more drinks on one occasion?: Never Preliminary  Score: 0 9. Have you or someone else been injured as a result of your drinking?: No 10. Has a relative or friend or a doctor or another health worker been concerned about your drinking or suggested you cut down?: No Alcohol Use Disorder Identification Test Final Score (AUDIT): 1 Brief Intervention: AUDIT score less than 7 or less-screening does not suggest unhealthy drinking-brief intervention not indicated  Past Medical History:   Past Medical History:  Diagnosis Date  . Hypertension    History reviewed. No pertinent surgical history.  Family History: History reviewed. No pertinent family history.  Family Psychiatric  History: Reports that her father was diagnosed with schizophrenia, she has a brother who abuses marijuana and alcohol. Her brother has attempted suicide. Her mother suffered from seasonal affective disorder.  Tobacco Screening: Have you used any form of tobacco in the last 30 days? (Cigarettes, Smokeless Tobacco, Cigars, and/or Pipes): No   Social History: Patient lives with her husband here in [place]. They have known each other for 13 years have been married for 5. They have a 31-month-old girl together. 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 Denies any legal history History  Alcohol Use No     History  Drug Use No    Additional Social History: Are you sexually active?: Yes What is your sexual orientation?: Heterosexual Has your sexual activity been affected by drugs, alcohol, medication, or emotional stress?: no Does patient have children?: Yes How many children?: 1 How is patient's relationship with their children?: good secure attachment      Allergies:  No Known Allergies   Lab Results:  Results for orders placed or performed during the hospital encounter of 04/28/17 (from the past 48 hour(s))  Lipid panel     Status: Abnormal   Collection Time: 04/29/17  6:33 AM  Result Value Ref Range   Cholesterol 182 0 - 200 mg/dL   Triglycerides 161 (H) <150 mg/dL   HDL 31 (L) >09 mg/dL   Total CHOL/HDL Ratio 5.9 RATIO   VLDL 39 0 - 40 mg/dL   LDL Cholesterol 604 (H) 0 - 99 mg/dL    Comment:        Total Cholesterol/HDL:CHD Risk Coronary Heart Disease Risk Table                     Men   Women  1/2 Average Risk   3.4   3.3  Average Risk       5.0   4.4  2 X Average Risk   9.6   7.1  3 X Average Risk  23.4   11.0        Use the calculated Patient Ratio above and the CHD  Risk Table to determine the patient's CHD Risk.        ATP III CLASSIFICATION (LDL):  <100     mg/dL   Optimal  540-981  mg/dL   Near or Above                    Optimal  130-159  mg/dL   Borderline  191-478  mg/dL   High  >295     mg/dL   Very High   TSH     Status: None   Collection Time: 04/29/17  6:33 AM  Result Value Ref Range   TSH 0.899 0.350 - 4.500 uIU/mL    Comment: Performed by a 3rd Generation assay with a functional sensitivity of <=0.01  uIU/mL.    Blood Alcohol level:  Lab Results  Component Value Date   ETH <5 04/27/2017    Metabolic Disorder Labs:  No results found for: HGBA1C, MPG No results found for: PROLACTIN Lab Results  Component Value Date   CHOL 182 04/29/2017   TRIG 195 (H) 04/29/2017   HDL 31 (L) 04/29/2017   CHOLHDL 5.9 04/29/2017   VLDL 39 04/29/2017   LDLCALC 112 (H) 04/29/2017    Current Medications: Current Facility-Administered Medications  Medication Dose Route Frequency Provider Last Rate Last Dose  . acetaminophen (TYLENOL) tablet 650 mg  650 mg Oral Q6H PRN Clapacs, John T, MD      . alum & mag hydroxide-simeth (MAALOX/MYLANTA) 200-200-20 MG/5ML suspension 30 mL  30 mL Oral Q4H PRN Clapacs, Jackquline Denmark, MD      . Melene Muller ON 04/30/2017] ARIPiprazole (ABILIFY) tablet 10 mg  10 mg Oral QHS Jimmy Footman, MD      . Melene Muller ON 04/30/2017] citalopram (CELEXA) tablet 10 mg  10 mg Oral QHS Jimmy Footman, MD      . magnesium hydroxide (MILK OF MAGNESIA) suspension 30 mL  30 mL Oral Daily PRN Clapacs, John T, MD      . metFORMIN (GLUCOPHAGE) tablet 500 mg  500 mg Oral Q breakfast Clapacs, Jackquline Denmark, MD   500 mg at 04/29/17 0837  . ondansetron (ZOFRAN-ODT) disintegrating tablet 4 mg  4 mg Oral Q8H PRN Jimmy Footman, MD      . ondansetron (ZOFRAN-ODT) disintegrating tablet 8 mg  8 mg Oral Once Jimmy Footman, MD       PTA Medications: Prescriptions Prior to Admission  Medication Sig Dispense Refill Last  Dose  . citalopram (CELEXA) 40 MG tablet Take 20 mg by mouth daily.        Musculoskeletal: Strength & Muscle Tone: within normal limits Gait & Station: normal Patient leans: N/A  Psychiatric Specialty Exam: Physical Exam  Constitutional: She is oriented to person, place, and time. She appears well-developed and well-nourished.  HENT:  Head: Normocephalic and atraumatic.  Eyes: Conjunctivae and EOM are normal.  Neck: Normal range of motion.  Respiratory: Effort normal.  Musculoskeletal: Normal range of motion.  Neurological: She is alert and oriented to person, place, and time.    Review of Systems  Constitutional: Negative.   HENT: Negative.   Eyes: Negative.   Respiratory: Negative.   Cardiovascular: Negative.   Gastrointestinal: Negative.   Genitourinary: Negative.   Musculoskeletal: Negative.   Skin: Negative.   Neurological: Negative.   Endo/Heme/Allergies: Negative.   Psychiatric/Behavioral: Positive for depression. Negative for hallucinations, memory loss, substance abuse and suicidal ideas. The patient is not nervous/anxious and does not have insomnia.     Blood pressure 134/90, pulse 92, temperature 98.8 F (37.1 C), temperature source Oral, resp. rate 18, height 5\' 1"  (1.549 m), weight 113.4 kg (250 lb), SpO2 100 %.Body mass index is 47.24 kg/m.  General Appearance: Well Groomed  Eye Contact:  Good  Speech:  Clear and Coherent  Volume:  Normal  Mood:  Dysphoric  Affect:  Appropriate and Congruent  Thought Process:  Linear and Descriptions of Associations: Intact  Orientation:  Full (Time, Place, and Person)  Thought Content:  Hallucinations: None  Suicidal Thoughts:  No  Homicidal Thoughts:  No  Memory:  Immediate;   Good Recent;   Good Remote;   Good  Judgement:  Fair  Insight:  Fair  Psychomotor Activity:  Normal  Concentration:  Concentration: Fair and Attention  Span: Fair  Recall:  [employer] of Knowledge:  Good  Language:  Good  Akathisia:  No   Handed:    AIMS (if indicated):     Assets:  Communication Skills Social Support  ADL's:  Intact  Cognition:  WNL  Sleep:  Number of Hours: 6.15    Treatment Plan Summary:  Patient is a 36 year old woman who suffers from mood symptoms and also psychosis. Potential diagnoses are bipolar disorder or schizoaffective disorder bipolar type.  Bipolar disorder: Patient has been restarted on citalopram and Abilify. She complains of sedation when she takes his medications in the morning. I will switch him to bedtime. I will also decrease the dose of citalopram to 10 mg as patient is having nausea.  Nausea and vomiting: Patient is started vomiting at the end of the assessment. I will order Zofran 4 mg when necessary  Borderline diabetes patient will be continued on metformin 500 mg a day  Labs I will order a lipid panel, hemoglobin A1c and TSH  EKG wnl qtc   Physician Treatment Plan for Primary Diagnosis: Bipolar disorder with psychotic features (HCC) Long Term Goal(s): Improvement in symptoms so as ready for discharge  Short Term Goals: Ability to identify changes in lifestyle to reduce recurrence of condition will improve, Ability to identify and develop effective coping behaviors will improve and Compliance with prescribed medications will improve  Physician Treatment Plan for Secondary Diagnosis: Principal Problem:   Bipolar disorder with psychotic features (HCC) Active Problems:   Benign essential hypertension   Familial multiple lipoprotein-type hyperlipidemia  Long Term Goal(s): Improvement in symptoms so as ready for discharge  Short Term Goals: Ability to identify triggers associated with substance abuse/mental health issues will improve  I certify that inpatient services furnished can reasonably be expected to improve the patient's condition.    Jimmy Footman, MD 8/28/20182:17 PM

## 2017-04-29 NOTE — Progress Notes (Signed)
Patient is pleasant and cooperative.Patient stated that she feels better today.Verbalized that her nausea subsided.compliant with medications.Attended groups.Denies suicidal or homicidal ideations and AV hallucinations.Appropriate with staff & peers.Appetite and energy level good.Support & encouragement given.

## 2017-04-29 NOTE — Progress Notes (Signed)
Recreation Therapy Notes  LRT was informed patient got sick this morning. LRT will attempt assessment tomorrow.  Jacquelynn Cree, LRT/CTRS 04/29/2017 10:54 AM

## 2017-04-29 NOTE — [facility] Group Notes (Signed)
[facility] Group Notes:  (Nursing/MHT/Case Management/Adjunct)  Date:  04/29/2017  Time:  3:01 PM  Type of Therapy:  Psychoeducational Skills  Participation Level:  Active  Participation Quality:  Appropriate, Attentive and Sharing  Affect:  Appropriate  Cognitive:  Alert and Appropriate  Insight:  Appropriate and Good  Engagement in Group:  Engaged  Modes of Intervention:  Discussion, Education and Support  Summary of Progress/Problems:  [name] Fritz Cauthon 04/29/2017, 3:01 PM

## 2017-04-30 LAB — HEMOGLOBIN A1C
Hgb A1c MFr Bld: 5.8 % — ABNORMAL HIGH (ref 4.8–5.6)
MEAN PLASMA GLUCOSE: 120 mg/dL

## 2017-04-30 MED ORDER — METFORMIN HCL ER 500 MG PO TB24
500.0000 mg | ORAL_TABLET | Freq: Every day | ORAL | Status: DC
  Administered 2017-05-01 – 2017-05-02 (×2): 500 mg via ORAL
  Filled 2017-04-30 (×2): qty 1

## 2017-04-30 MED ORDER — METOPROLOL SUCCINATE ER 25 MG PO TB24
12.5000 mg | ORAL_TABLET | Freq: Every day | ORAL | Status: DC
  Administered 2017-04-30 – 2017-05-02 (×3): 12.5 mg via ORAL
  Filled 2017-04-30 (×3): qty 1

## 2017-04-30 MED ORDER — ARIPIPRAZOLE 5 MG PO TABS
15.0000 mg | ORAL_TABLET | Freq: Every day | ORAL | Status: DC
  Administered 2017-04-30 – 2017-05-01 (×2): 15 mg via ORAL
  Filled 2017-04-30 (×2): qty 1

## 2017-04-30 NOTE — [facility] Group Notes (Signed)
[facility] Group Notes:  (Nursing/MHT/Case Management/Adjunct)  Date:  04/30/2017  Time:  5:43 PM  Type of Therapy:  Psychoeducational Skills  Participation Level:  Active  Participation Quality:  Appropriate  Affect:  Appropriate  Cognitive:  Appropriate  Insight:  Appropriate  Engagement in Group:  Engaged  Modes of Intervention:  Socialization  Summary of Progress/Problems:  Lynelle SmokeCara Travis [name] 04/30/2017, 5:43 PM

## 2017-04-30 NOTE — Tx Team (Signed)
Interdisciplinary Treatment and Diagnostic Plan Update  04/30/2017 Time of Session: 11:00 AM Kaylee Hill MRN: [id number]  Principal Diagnosis: Bipolar disorder with psychotic features [facility])  Secondary Diagnoses: Principal Problem:   Bipolar disorder with psychotic features (HCC) Active Problems:   Benign essential hypertension   Familial multiple lipoprotein-type hyperlipidemia   Current Medications:  Current Facility-Administered Medications  Medication Dose Route Frequency Provider Last Rate Last Dose  . acetaminophen (TYLENOL) tablet 650 mg  650 mg Oral Q6H PRN Clapacs, John T, MD      . alum & mag hydroxide-simeth (MAALOX/MYLANTA) 200-200-20 MG/5ML suspension 30 mL  30 mL Oral Q4H PRN Clapacs, John T, MD      . ARIPiprazole (ABILIFY) tablet 15 mg  15 mg Oral QHS Jimmy Footman, MD      . citalopram (CELEXA) tablet 10 mg  10 mg Oral QHS Jimmy Footman, MD      . magnesium hydroxide (MILK OF MAGNESIA) suspension 30 mL  30 mL Oral Daily PRN Clapacs, Jackquline Denmark, MD      . Melene Muller ON 05/01/2017] metFORMIN (GLUCOPHAGE-XR) 24 hr tablet 500 mg  500 mg Oral Q breakfast Jimmy Footman, MD      . metoprolol succinate (TOPROL-XL) 24 hr tablet 12.5 mg  12.5 mg Oral Daily Jimmy Footman, MD      . ondansetron (ZOFRAN-ODT) disintegrating tablet 4 mg  4 mg Oral Q8H PRN Jimmy Footman, MD   4 mg at 04/30/17 0835  . ondansetron (ZOFRAN-ODT) disintegrating tablet 8 mg  8 mg Oral Once Jimmy Footman, MD       PTA Medications: Prescriptions Prior to Admission  Medication Sig Dispense Refill Last Dose  . citalopram (CELEXA) 40 MG tablet Take 20 mg by mouth daily.        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   Physician Treatment Plan for Primary Diagnosis: Bipolar disorder with psychotic features (HCC) Long Term Goal(s): Improvement in symptoms so as ready for discharge Improvement in symptoms so as ready for discharge   Short Term Goals: Ability to identify changes in lifestyle to reduce recurrence of condition will improve Ability to identify and develop effective coping behaviors will improve Compliance with prescribed medications will improve Ability to identify triggers associated with substance abuse/mental health issues will improve  Medication Management: Evaluate patient's response, side effects, and tolerance of medication regimen.  Therapeutic Interventions: 1 to 1 sessions, Unit Group sessions and Medication administration.  Evaluation of Outcomes: Progressing  Physician Treatment Plan for Secondary Diagnosis: Principal Problem:   Bipolar disorder with psychotic features (HCC) Active Problems:   Benign essential hypertension   Familial multiple lipoprotein-type hyperlipidemia  Long Term Goal(s): Improvement in symptoms so as ready for discharge Improvement in symptoms so as ready for discharge   Short Term Goals: Ability to identify changes in lifestyle to reduce recurrence of condition will improve Ability to identify and develop effective coping behaviors will improve Compliance with prescribed medications will improve Ability to identify triggers associated with substance abuse/mental health issues will improve     Medication Management: Evaluate patient's response, side effects, and tolerance of medication regimen.  Therapeutic Interventions: 1 to 1 sessions, Unit Group sessions and Medication administration.  Evaluation of Outcomes: Progressing   RN Treatment Plan for Primary  Diagnosis: Bipolar disorder with psychotic features (HCC) Long  Term Goal(s): Knowledge of disease and therapeutic regimen to maintain health will improve  Short Term Goals: Ability to demonstrate self-control, Ability to verbalize feelings will improve and Ability to disclose and discuss suicidal ideas  Medication Management: RN will administer medications as ordered by provider, will assess and evaluate patient's response and provide education to patient for prescribed medication. RN will report any adverse and/or side effects to prescribing provider.  Therapeutic Interventions: 1 on 1 counseling sessions, Psychoeducation, Medication administration, Evaluate responses to treatment, Monitor vital signs and CBGs as ordered, Perform/monitor CIWA, COWS, AIMS and Fall Risk screenings as ordered, Perform wound care treatments as ordered.  Evaluation of Outcomes: Progressing   LCSW Treatment Plan for Primary Diagnosis: Bipolar disorder with psychotic features [facility]) Long Term Goal(s): Safe transition to appropriate next level of care at discharge, Engage patient in therapeutic group addressing interpersonal concerns.  Short Term Goals: Engage patient in aftercare planning with referrals and resources, Increase social support and Increase emotional regulation  Therapeutic Interventions: Assess for all discharge needs, 1 to 1 time with Social worker, Explore available resources and support systems, Assess for adequacy in community support network, Educate family and significant other(s) on suicide prevention, Complete Psychosocial Assessment, Interpersonal group therapy.  Evaluation of Outcomes: Progressing   Progress in Treatment: Attending groups: Yes. Participating in groups: Yes. Taking medication as prescribed: Yes. Toleration medication: Yes. Family/Significant other contact made: Yes, individual(s) contacted:  CSW spoke with patient's husband. Patient understands diagnosis: Yes. Discussing patient identified problems/goals with staff: Yes. Medical  problems stabilized or resolved: Yes. Denies suicidal/homicidal ideation: Yes. Issues/concerns per patient self-inventory: No.  New problem(s) identified: No, Describe:  None.  New Short Term/Long Term Goal(s): Patient stated that her goal is to feel better overall.  Discharge Plan or Barriers: Patient will discharge home and follow-up with follow-up with outpatient providers.  Reason for Continuation of Hospitalization: Delusions  Suicidal ideation  Estimated Length of Stay: 2-3 days   Attendees: Patient: Kaylee Hill  04/30/2017 11:17 AM  Physician: Dr. Radene Journey, MD  04/30/2017 11:17 AM  Nursing: Hulan Amato, RN  04/30/2017 11:17 AM  RN Care Manager: 04/30/2017 11:17 AM  Social Worker: Hampton Abbot, MSW, LCSW-A 04/30/2017 11:17 AM  Recreational Therapist: Princella Ion, LRT, CTRS  04/30/2017 11:17 AM  Other:  04/30/2017 11:17 AM  Other:  04/30/2017 11:17 AM  Other: 04/30/2017 11:17 AM    Scribe for Treatment Team: Lynden Oxford, LCSWA 04/30/2017 11:17 AM

## 2017-04-30 NOTE — Plan of Care (Signed)
Problem: [facility] Participation in Recreation Therapeutic Interventions Goal: STG-Patient will identify at least five coping skills for ** STG: Coping Skills - Within 4 treatment sessions, patient will verbalize at least 5 coping skills for anger in each of 2 treatment sessions to increase anger management skills.  Outcome: Progressing Treatment Session 1; Completed 1 out of 2: At approximately 2:05 pm, LRT met with patient in community room. Patient verbalized 5 coping skills for anger. Patient verbalized what triggered her to get angry, how her body responds to anger, and how she can remind herself to use her healthy coping skills. LRT provided suggestions as well.  Leonette Monarch, LRT/CTRS 08.29.18 3:54 pm Goal: STG-Other Recreation Therapy Goal (Specify) STG: Stress Management - Within 4 treatment sessions, patient will verbalize understanding of the stress management techniques in each of 2 treatment sessions to increase stress management skills.  Outcome: Progressing Treatment Session 1; Completed 1 out of 2: At approximately 2:05 pm, LRT met with patient in community room. LRT educated and provided patient with handouts on stress management techniques. Patient verbalized understanding. LRT encouraged patient to read over and practice the stress management techniques.  Leonette Monarch, LRT/CTRS 08.29.18 3:56 pm

## 2017-04-30 NOTE — Progress Notes (Signed)
Recreation Therapy Notes  Date: 08.29.18 Time: 1:00 pm Location: Craft Room  Group Topic: Self-esteem  Goal Area(s) Addresses:  Patient will identify positive traits about self. Patient will verbalize benefit of having a healthy self-esteem.  Behavioral Response: Attentive, Interactive  Intervention: I Am   Activity: Patients were given worksheet with the letter I on it and were instructed to write as many positive traits about themselves inside the letter.  Education: LRT educated patients on ways to increase their self-esteem.  Education Outcome: Acknowledges education/In group clarification offered  Clinical Observations/Feedback: Patient wrote positive traits about self. Patient contributed to group discussion by stating it was easy and difficult to think of positive traits, what makes it difficult to think of positive traits, how her self-esteem affects her, and how she can improve her self-esteem.  Jacquelynn CreeGreene,Jaiyah Beining M, LRT/CTRS 04/30/2017 1:49 PM

## 2017-04-30 NOTE — Plan of Care (Signed)
Problem: Education: Goal: Mental status will improve Outcome: Progressing Patient reports feeling better, is ready to go home, but understands she needs more time and therapy

## 2017-04-30 NOTE — Progress Notes (Addendum)
Patient alert and oriented x 4, no distress noted, affect is brighter, thoughts are organized and coherent, no bizarre behavior noted, denies SI/HI/AVH, rated depression a 4/10. Patient appears less anxious, interacting appropriately with peers and staff. Patient is visible on the milieu, attended evening group. Will continue to closely monitor

## 2017-04-30 NOTE — [facility] Group Notes (Signed)
[facility] Group Notes:  (Nursing/MHT/Case Management/Adjunct)  Date:  04/30/2017  Time:  9:52 PM  Type of Therapy:  Group Therapy  Participation Level:  Active  Participation Quality:  Appropriate  Affect:  Appropriate  Cognitive:  Alert  Insight:  Good  Engagement in Group:  Engaged  Modes of Intervention:  Support  Summary of Progress/Problems:  Kaylee Hill 04/30/2017, 9:52 PM

## 2017-04-30 NOTE — [facility] Group Notes (Signed)
[facility] Group Notes:  (Nursing/MHT/Case Management/Adjunct)  Date:  04/30/2017  Time:  4:48 AM  Type of Therapy:  Group Therapy  Participation Level:  Active  Participation Quality:  Appropriate  Affect:  Appropriate  Cognitive:  Appropriate  Insight:  Appropriate  Engagement in Group:  Engaged  Modes of Intervention:  Discussion  Summary of Progress/Problems:  Burt EkJanice Marie Eligah Anello 04/30/2017, 4:48 AM

## 2017-04-30 NOTE — Progress Notes (Signed)
[facility] MD Progress Note  04/30/2017 9:14 AM Kaylee Hill  MRN:  [id number] Subjective:  Patient is a 36 year old married African-American female with prior history of bipolar disorder. Patient presented voluntarily in the company of her husband to our emergency department on August 26 due to depression and suicidal ideation.  Patient has been without psychiatric treatment for about 2 years. She has a daughter who is 34 months old and since she got pregnant she has not follow-up with psychiatry.    Patient states that during those 2 years she has been paranoid. She feels that people at work are talking about her behind her back, she constantly feels that her job is in jeopardy. She frequently feels that she is not good enough and that somebody is out to get her.   For the last month she has been more depressed. She started hearing voices about 2 days prior to admission. The voices were instructing her to harm herself. The voices sound like her own voice but distorted. Patient says she locked herself in the bathroom with a knife but her husband stopped her.  Patient says that in the past people at work have told her that she speaks too fast, she says that recently prior to admission she was staying up to 2-3 AM in the morning making guacamole, did that for several nights.  8/29 patient feels more able to focus on things. She denies having suicidal thoughts, homicidal thoughts or hallucinations. Continues to have episodes of nausea, had to take Zofran this morning. Yesterday she was vomiting.  Denies any other side effects or physical complaints   Per nursing: Patient alert and oriented x 4, no distress noted, affect is brighter, thoughts are organized and coherent, no bizarre behavior noted, denies SI/HI/AVH. Patient appears less anxious, interacting appropriately with peers and staff. Patient is visible on the milieu, attended evening group. Will continue to closely monitor  Principal Problem:  Bipolar disorder with psychotic features [facility]) Diagnosis:   Patient Active Problem List   Diagnosis Date Noted  . Bipolar disorder with psychotic features (HCC) [F31.9] 04/28/2017  . Benign essential hypertension [I10] 04/09/2013  . Familial multiple lipoprotein-type hyperlipidemia [E78.4] 04/09/2013   Total Time spent with patient: 30 minutes  Past Psychiatric History:  Patient was hospitalized in [place] for the first time in 2006. At that time she was diagnosed with major depressive disorder with psychotic features. She was hearing voices that were telling her to kill herself. She was then hospitalized a few years later again in [place] at that time she was diagnosed for bipolar disorder with psychotic features.  Patient has had man near suicide attempts before. The first time whereas in high school when she tried to overdose on medications she holds her brother but never took them, she also has held razor blades to cut but was not able to do it.  Past Medical History:  Past Medical History:  Diagnosis Date  . Hypertension    History reviewed. No pertinent surgical history.  Family History: History reviewed. No pertinent family history.   Family Psychiatric  History: Reports that her father was diagnosed with schizophrenia, she has a brother who abuses marijuana and alcohol. Her brother has attempted suicide. Her mother suffered from seasonal affective disorder.  Social History: Patient lives with her husband here in [place]. They have known each other for 13 years have been married for 5. They have a 40-month-old girl together. 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 Denies  any legal history History  Alcohol Use No     History  Drug Use No    Social History   Social History  . Marital status: Married    Spouse name: N/A  . Number of children: N/A  . Years of education: N/A   Social History Main Topics  . Smoking status: Never Smoker  . Smokeless tobacco:  Never Used  . Alcohol use No  . Drug use: No  . Sexual activity: Not Asked   Other Topics Concern  . None   Social History Narrative  . None     Current Medications: Current Facility-Administered Medications  Medication Dose Route Frequency Provider Last Rate Last Dose  . acetaminophen (TYLENOL) tablet 650 mg  650 mg Oral Q6H PRN Clapacs, John T, MD      . alum & mag hydroxide-simeth (MAALOX/MYLANTA) 200-200-20 MG/5ML suspension 30 mL  30 mL Oral Q4H PRN Clapacs, John T, MD      . ARIPiprazole (ABILIFY) tablet 10 mg  10 mg Oral QHS Jimmy Footman, MD      . citalopram (CELEXA) tablet 10 mg  10 mg Oral QHS Jimmy Footman, MD      . magnesium hydroxide (MILK OF MAGNESIA) suspension 30 mL  30 mL Oral Daily PRN Clapacs, John T, MD      . metFORMIN (GLUCOPHAGE) tablet 500 mg  500 mg Oral Q breakfast Clapacs, Jackquline Denmark, MD   500 mg at 04/30/17 0753  . ondansetron (ZOFRAN-ODT) disintegrating tablet 4 mg  4 mg Oral Q8H PRN Jimmy Footman, MD   4 mg at 04/30/17 0835  . ondansetron (ZOFRAN-ODT) disintegrating tablet 8 mg  8 mg Oral Once Jimmy Footman, MD        Lab Results:  Results for orders placed or performed during the hospital encounter of 04/28/17 (from the past 48 hour(s))  Hemoglobin A1c     Status: Abnormal   Collection Time: 04/29/17  6:33 AM  Result Value Ref Range   Hgb A1c MFr Bld 5.8 (H) 4.8 - 5.6 %    Comment: (NOTE)         Prediabetes: 5.7 - 6.4         Diabetes: >6.4         Glycemic control for adults with diabetes: <7.0 CORRECTED ON 08/29 AT 0130: PREVIOUSLY REPORTED AS 6.1    Mean Plasma Glucose 120 mg/dL    Comment: (NOTE) Performed At: [facility] [address] [place], [place] [id number] Mila Homer MD WU:[id number] CORRECTED ON 08/29 AT 0130: PREVIOUSLY REPORTED AS 128.37   Lipid panel     Status: Abnormal   Collection Time: 04/29/17  6:33 AM  Result Value Ref Range   Cholesterol 182 0 - 200  mg/dL   Triglycerides 956 (H) <150 mg/dL   HDL 31 (L) >21 mg/dL   Total CHOL/HDL Ratio 5.9 RATIO   VLDL 39 0 - 40 mg/dL   LDL Cholesterol 308 (H) 0 - 99 mg/dL    Comment:        Total Cholesterol/HDL:CHD Risk Coronary Heart Disease Risk Table                     Men   Women  1/2 Average Risk   3.4   3.3  Average Risk       5.0   4.4  2 X Average Risk   9.6   7.1  3 X Average Risk  23.4   11.0  Use the calculated Patient Ratio above and the CHD Risk Table to determine the patient's CHD Risk.        ATP III CLASSIFICATION (LDL):  <100     mg/dL   Optimal  [id number]  mg/dL   Near or Above                    Optimal  130-159  mg/dL   Borderline  [id number]  mg/dL   High  >811>190     mg/dL   Very High   TSH     Status: None   Collection Time: 04/29/17  6:33 AM  Result Value Ref Range   TSH 0.899 0.350 - 4.500 uIU/mL    Comment: Performed by a 3rd Generation assay with a functional sensitivity of <=0.01 uIU/mL.    Blood Alcohol level:  Lab Results  Component Value Date   ETH <5 04/27/2017    Metabolic Disorder Labs: Lab Results  Component Value Date   HGBA1C 5.8 (H) 04/29/2017   MPG 120 04/29/2017   No results found for: PROLACTIN Lab Results  Component Value Date   CHOL 182 04/29/2017   TRIG 195 (H) 04/29/2017   HDL 31 (L) 04/29/2017   CHOLHDL 5.9 04/29/2017   VLDL 39 04/29/2017   LDLCALC 112 (H) 04/29/2017    Physical Findings: AIMS: Facial and Oral Movements Muscles of Facial Expression: None, normal Lips and Perioral Area: None, normal Jaw: None, normal Tongue: None, normal,Extremity Movements Upper (arms, wrists, hands, fingers): None, normal Lower (legs, knees, ankles, toes): None, normal, Trunk Movements Neck, shoulders, hips: None, normal, Overall Severity Severity of abnormal movements (highest score from questions above): None, normal Incapacitation due to abnormal movements: None, normal Patient's awareness of abnormal movements (rate only  patient's report): No Awareness, Dental Status Current problems with teeth and/or dentures?: No Does patient usually wear dentures?: No  CIWA:    COWS:     Musculoskeletal: Strength & Muscle Tone: within normal limits Gait & Station: normal Patient leans: N/A  Psychiatric Specialty Exam: Physical Exam  Constitutional: She is oriented to person, place, and time. She appears well-developed and well-nourished.  HENT:  Head: Normocephalic and atraumatic.  Eyes: Conjunctivae and EOM are normal.  Neck: Normal range of motion.  Respiratory: Effort normal.  Musculoskeletal: Normal range of motion.  Neurological: She is alert and oriented to person, place, and time.    Review of Systems  Constitutional: Negative.   HENT: Negative.   Eyes: Negative.   Respiratory: Negative.   Cardiovascular: Negative.   Gastrointestinal: Negative.   Genitourinary: Negative.   Musculoskeletal: Negative.   Skin: Negative.   Neurological: Negative.   Endo/Heme/Allergies: Negative.   Psychiatric/Behavioral: Positive for depression. Negative for hallucinations, memory loss, substance abuse and suicidal ideas. The patient is not nervous/anxious and does not have insomnia.     Blood pressure (!) 134/100, pulse 97, temperature 99 F (37.2 C), temperature source Oral, resp. rate 18, height 5\' 1"  (1.549 m), weight 113.4 kg (250 lb), SpO2 100 %.Body mass index is 47.24 kg/m.  General Appearance: Well Groomed  Eye Contact:  Good  Speech:  Clear and Coherent  Volume:  Normal  Mood:  Dysphoric  Affect:  Appropriate and Congruent  Thought Process:  Linear and Descriptions of Associations: Intact  Orientation:  Full (Time, Place, and Person)  Thought Content:  Hallucinations: None  Suicidal Thoughts:  No  Homicidal Thoughts:  No  Memory:  Immediate;   Good Recent;   Good  Remote;   Good  Judgement:  Fair  Insight:  Fair  Psychomotor Activity:  Normal  Concentration:  Concentration: Good and Attention  Span: Good  Recall:  Good  Fund of Knowledge:  Good  Language:  Good  Akathisia:  No  Handed:    AIMS (if indicated):     Assets:  Communication Skills Physical Health  ADL's:  Intact  Cognition:  WNL  Sleep:  Number of Hours: 7.5     Treatment Plan Summary: Daily contact with patient to assess and evaluate symptoms and progress in treatment and Medication management   Patient is a 36 year old woman who suffers from mood symptoms and also psychosis. Potential diagnoses are bipolar disorder or schizoaffective disorder bipolar type.  Bipolar disorder: Continue Celexa 10 mg by mouth daily at bedtime, Abilify will be increased to 15 mg by mouth daily at bedtime. Patient prefers to take both of these medications at night as she finds them to be very sedating.  Nausea and vomiting: Continue with orders for Zofran when necessary  Borderline diabetes: patient will be continued on metformin 500 mg a day--- will change to metformin XR due to nausea  Hypertension: Blood pressure has been elevated. I will restart her on metoprolol 12.5 mg by mouth daily  Labs: TSH wnl, HbA1c 5.8.  Lipid panel mildly increased TG and LDL   EKG wnl qtc  Jimmy Footman, MD 04/30/2017, 9:14 AM

## 2017-04-30 NOTE — [facility] Group Notes (Signed)
[facility] LCSW Group Therapy   04/30/2017  9:30 am   Type of Therapy: Group Therapy   Participation Level: Active   Participation Quality: Attentive, Sharing and Supportive   Affect: Appropriate   Cognitive: Alert and Oriented   Insight: Developing/Improving and Engaged   Engagement in Therapy: Developing/Improving and Engaged   Modes of Intervention: Clarification, Confrontation, Discussion, Education, Exploration, Limit-setting, Orientation, Problem-solving, Rapport Building, Dance movement psychotherapist, Socialization and Support   Summary of Progress/Problems: The topic for group today was emotional regulation. This group focused on both positive and negative emotion identification and allowed  group members to process ways to identify feelings, regulate negative emotions, and find healthy ways to manage internal/external emotions. Group members were asked to reflect on a time when their reaction to an emotion led to a negative outcome and explored how alternative responses using emotion regulation would have benefited them. Group members were also asked to discuss a time when emotion regulation was utilized when a negative emotion was experienced. Patient defined emotion regulation and ways to regulate negative emotions. Patient identified negative emotions such as feelings sadness. She identified coping mechanisms and ways to regulate negative emotions.   Hampton Abbot, MSW, LCSWA 04/30/2017, 10:35AM

## 2017-04-30 NOTE — Progress Notes (Signed)
Patient pleasant and cooperative. Med and group compliant. Appropriate with staff and peers. Pt reports doing much better, but realizes she needs to stay longer for treatment. Patient denies SI, HI, AVH.  Encouragement and support offered. Medications given as prescribed. Safety checks maintained. Pt receptive and remains safe on unit with q 15 min checks.

## 2017-05-01 DIAGNOSIS — E119 Type 2 diabetes mellitus without complications: Secondary | ICD-10-CM

## 2017-05-01 MED ORDER — METOPROLOL SUCCINATE ER 25 MG PO TB24: 12.5000 mg | ORAL_TABLET | Freq: Every day | ORAL | 1 refills | Status: DC

## 2017-05-01 MED ORDER — CITALOPRAM HYDROBROMIDE 10 MG PO TABS: 10.0000 mg | ORAL_TABLET | Freq: Every day | ORAL | 1 refills | Status: DC

## 2017-05-01 MED ORDER — ARIPIPRAZOLE 15 MG PO TABS: 15.0000 mg | ORAL_TABLET | Freq: Every day | ORAL | 1 refills | Status: DC

## 2017-05-01 MED ORDER — METFORMIN HCL ER 500 MG PO TB24: 500.0000 mg | ORAL_TABLET | Freq: Every day | ORAL | 1 refills | Status: DC

## 2017-05-01 NOTE — Progress Notes (Signed)
Recreation Therapy Notes  Date: 08.30.18 Time: 1:00 pm Location: Craft Room  Group Topic: Leisure Education  Goal Area(s) Addresses:  Patient will identify activities for each letter of the alphabet. Patient will verbalize ability to integrate positive leisure into life post d/c. Patient will verbalize ability to use leisure as a Associate Professorcoping skill.  Behavioral Response: Attentive, Interactive  Intervention: Leisure Education  Activity: Patients were given a Leisure Information systems managerAlphabet worksheet and were instructed to write healthy leisure activities for each letter of the alphabet.  Education: LRT educated patients on what they need to participate in leisure.   Education Outcome: Acknowledges education/In group clarification offered  Clinical Observations/Feedback: Patient wrote healthy leisure activities. Patient contributed to group discussion by stating healthy leisure activities, how she can integrate leisure into her schedule, and what makes leisure a good coping skill.  Jacquelynn CreeGreene,Raymonde Hamblin M, LRT/CTRS 05/01/2017 1:46 PM

## 2017-05-01 NOTE — Plan of Care (Signed)
Problem: Safety: Goal: Ability to remain free from injury will improve Outcome: Progressing Pt remains safe while in hospital injury free.    

## 2017-05-01 NOTE — Plan of Care (Signed)
Problem: Coping: Goal: Ability to verbalize feelings will improve Outcome: Progressing Patient verbalized feelings to staff.    

## 2017-05-01 NOTE — Progress Notes (Signed)
[facility] MD Progress Note  05/01/2017 9:14 AM Kaylee Hill  MRN:  [id number] Subjective:  Patient is a 36 year old married African-American female with prior history of bipolar disorder. Patient presented voluntarily in the company of her husband to our emergency department on August 26 due to depression and suicidal ideation.  Patient has been without psychiatric treatment for about 2 years. She has a daughter who is 22 months old and since she got pregnant she has not follow-up with psychiatry.    Patient states that during those 2 years she has been paranoid. She feels that people at work are talking about her behind her back, she constantly feels that her job is in jeopardy. She frequently feels that she is not good enough and that somebody is out to get her.   For the last month she has been more depressed. She started hearing voices about 2 days prior to admission. The voices were instructing her to harm herself. The voices sound like her own voice but distorted. Patient says she locked herself in the bathroom with a knife but her husband stopped her.  Patient says that in the past people at work have told her that she speaks too fast, she says that recently prior to admission she was staying up to 2-3 AM in the morning making guacamole, did that for several nights.  8/29 patient feels more able to focus on things. She denies having suicidal thoughts, homicidal thoughts or hallucinations. Continues to have episodes of nausea, had to take Zofran this morning. Yesterday she was vomiting.  Denies any other side effects or physical complaints  8/30 patient reports feeling a little depressed today, she received news that she was rejected by a job that she recently applied to. Other than that she reports feeling well. No longer having suicidal thoughts. Denies hallucinations. There is no evidence of mania or hypomania. She has been is sleeping well and has been tolerating medications well. She denies  any side effects.   Per nursing: Patient pleasant and cooperative. Med and group compliant. Appropriate with staff and peers. Pt reports doing much better, but realizes she needs to stay longer for treatment. Patient denies SI, HI, AVH.  Encouragement and support offered. Medications given as prescribed. Safety checks maintained. Pt receptive and remains safe on unit with q 15 min checks.  Principal Problem: Bipolar disorder with psychotic features [facility]) Diagnosis:   Patient Active Problem List   Diagnosis Date Noted  . Bipolar disorder with psychotic features (HCC) [F31.9] 04/28/2017  . Benign essential hypertension [I10] 04/09/2013  . Familial multiple lipoprotein-type hyperlipidemia [E78.4] 04/09/2013   Total Time spent with patient: 30 minutes  Past Psychiatric History:  Patient was hospitalized in [place] for the first time in 2006. At that time she was diagnosed with major depressive disorder with psychotic features. She was hearing voices that were telling her to kill herself. She was then hospitalized a few years later again in [place] at that time she was diagnosed for bipolar disorder with psychotic features.  Patient has had man near suicide attempts before. The first time whereas in high school when she tried to overdose on medications she holds her brother but never took them, she also has held razor blades to cut but was not able to do it.  Past Medical History:  Past Medical History:  Diagnosis Date  . Hypertension    History reviewed. No pertinent surgical history.  Family History: History reviewed. No pertinent family history.   Family Psychiatric  History: Reports that her father was diagnosed with schizophrenia, she has a brother who abuses marijuana and alcohol. Her brother has attempted suicide. Her mother suffered from seasonal affective disorder.  Social History: Patient lives with her husband here in [place]. They have known each other for 13 years have  been married for 5. They have a 41-month-old girl together. 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 Denies any legal history History  Alcohol Use No     History  Drug Use No    Social History   Social History  . Marital status: Married    Spouse name: N/A  . Number of children: N/A  . Years of education: N/A   Social History Main Topics  . Smoking status: Never Smoker  . Smokeless tobacco: Never Used  . Alcohol use No  . Drug use: No  . Sexual activity: Not Asked   Other Topics Concern  . None   Social History Narrative  . None     Current Medications: Current Facility-Administered Medications  Medication Dose Route Frequency Provider Last Rate Last Dose  . acetaminophen (TYLENOL) tablet 650 mg  650 mg Oral Q6H PRN Clapacs, John T, MD      . alum & mag hydroxide-simeth (MAALOX/MYLANTA) 200-200-20 MG/5ML suspension 30 mL  30 mL Oral Q4H PRN Clapacs, John T, MD      . ARIPiprazole (ABILIFY) tablet 15 mg  15 mg Oral QHS Jimmy Footman, MD   15 mg at 04/30/17 2142  . citalopram (CELEXA) tablet 10 mg  10 mg Oral QHS Jimmy Footman, MD   10 mg at 04/30/17 2142  . magnesium hydroxide (MILK OF MAGNESIA) suspension 30 mL  30 mL Oral Daily PRN Clapacs, John T, MD      . metFORMIN (GLUCOPHAGE-XR) 24 hr tablet 500 mg  500 mg Oral Q breakfast Jimmy Footman, MD   500 mg at 05/01/17 0825  . metoprolol succinate (TOPROL-XL) 24 hr tablet 12.5 mg  12.5 mg Oral Daily Jimmy Footman, MD   12.5 mg at 05/01/17 0825  . ondansetron (ZOFRAN-ODT) disintegrating tablet 4 mg  4 mg Oral Q8H PRN Jimmy Footman, MD   4 mg at 04/30/17 0835  . ondansetron (ZOFRAN-ODT) disintegrating tablet 8 mg  8 mg Oral Once Jimmy Footman, MD        Lab Results:  No results found for this or any previous visit (from the past 48 hour(s)).  Blood Alcohol level:  Lab Results  Component Value Date   ETH <5 04/27/2017    Metabolic  Disorder Labs: Lab Results  Component Value Date   HGBA1C 5.8 (H) 04/29/2017   MPG 120 04/29/2017   No results found for: PROLACTIN Lab Results  Component Value Date   CHOL 182 04/29/2017   TRIG 195 (H) 04/29/2017   HDL 31 (L) 04/29/2017   CHOLHDL 5.9 04/29/2017   VLDL 39 04/29/2017   LDLCALC 112 (H) 04/29/2017    Physical Findings: AIMS: Facial and Oral Movements Muscles of Facial Expression: None, normal Lips and Perioral Area: None, normal Jaw: None, normal Tongue: None, normal,Extremity Movements Upper (arms, wrists, hands, fingers): None, normal Lower (legs, knees, ankles, toes): None, normal, Trunk Movements Neck, shoulders, hips: None, normal, Overall Severity Severity of abnormal movements (highest score from questions above): None, normal Incapacitation due to abnormal movements: None, normal Patient's awareness of abnormal movements (rate only patient's report): No Awareness, Dental Status Current problems with teeth and/or dentures?: No Does patient usually wear  dentures?: No  CIWA:    COWS:     Musculoskeletal: Strength & Muscle Tone: within normal limits Gait & Station: normal Patient leans: N/A  Psychiatric Specialty Exam: Physical Exam  Constitutional: She is oriented to person, place, and time. She appears well-developed and well-nourished.  HENT:  Head: Normocephalic and atraumatic.  Eyes: Conjunctivae and EOM are normal.  Neck: Normal range of motion.  Respiratory: Effort normal.  Musculoskeletal: Normal range of motion.  Neurological: She is alert and oriented to person, place, and time.    Review of Systems  Constitutional: Negative.   HENT: Negative.   Eyes: Negative.   Respiratory: Negative.   Cardiovascular: Negative.   Gastrointestinal: Negative.   Genitourinary: Negative.   Musculoskeletal: Negative.   Skin: Negative.   Neurological: Negative.   Endo/Heme/Allergies: Negative.   Psychiatric/Behavioral: Positive for depression.  Negative for hallucinations, memory loss, substance abuse and suicidal ideas. The patient is not nervous/anxious and does not have insomnia.     Blood pressure 129/87, pulse 76, temperature 98.6 F (37 C), temperature source Oral, resp. rate 18, height 5\' 1"  (1.549 m), weight 113.4 kg (250 lb), SpO2 100 %.Body mass index is 47.24 kg/m.  General Appearance: Well Groomed  Eye Contact:  Good  Speech:  Clear and Coherent  Volume:  Normal  Mood:  Dysphoric  Affect:  Appropriate and Congruent  Thought Process:  Linear and Descriptions of Associations: Intact  Orientation:  Full (Time, Place, and Person)  Thought Content:  Hallucinations: None  Suicidal Thoughts:  No  Homicidal Thoughts:  No  Memory:  Immediate;   Good Recent;   Good Remote;   Good  Judgement:  Fair  Insight:  Fair  Psychomotor Activity:  Normal  Concentration:  Concentration: Good and Attention Span: Good  Recall:  Good  Fund of Knowledge:  Good  Language:  Good  Akathisia:  No  Handed:    AIMS (if indicated):     Assets:  Communication Skills Physical Health  ADL's:  Intact  Cognition:  WNL  Sleep:  Number of Hours: 6.5     Treatment Plan Summary: Daily contact with patient to assess and evaluate symptoms and progress in treatment and Medication management   Patient is a 36 year old woman who suffers from mood symptoms and also psychosis. Potential diagnoses are bipolar disorder or schizoaffective disorder bipolar type.  Bipolar disorder: Continue Celexa 10 mg by mouth daily at bedtime and Abilify  15 mg by mouth daily at bedtime. Patient prefers to take both of these medications at night as she finds them to be very sedating.  Nausea and vomiting: resolved  Borderline diabetes: patient will be continued on metformin 500 mg a day---  Changed to metformin XR due to nausea  Hypertension: Blood pressure has been elevated. Pt has been started on  metoprolol 12.5 mg by mouth daily--VS stable  Labs: TSH wnl,  HbA1c 5.8.  Lipid panel mildly increased TG and LDL   EKG wnl qtc  Possible d/c tomorrow  Prescriptions will be sent electronically to her pharmacy.    Jimmy FootmanHernandez-Gonzalez,  Renesmae Donahey, MD 05/01/2017, 9:14 AM

## 2017-05-01 NOTE — [facility] Group Notes (Signed)
[facility] LCSW Group Therapy   05/01/2017 9:30 am   Type of Therapy: Group Therapy   Participation Level: Active   Participation Quality: Attentive, Sharing and Supportive   Affect: Appropriate   Cognitive: Alert and Oriented   Insight: Developing/Improving and Engaged   Engagement in Therapy: Developing/Improving and Engaged   Modes of Intervention: Clarification, Confrontation, Discussion, Education, Exploration, Limit-setting, Orientation, Problem-solving, Rapport Building, Dance movement psychotherapisteality Testing, Socialization and Support   Summary of Progress/Problems: The topic for group was balance in life. Today's group focused on defining balance in one's own words, identifying things that can knock one off balance, and exploring healthy ways to maintain balance in life. Group members were asked to provide an example of a time when they felt off balance, describe how they handled that situation, and process healthier ways to regain balance in the future. Group members were asked to share the most important tool for maintaining balance that they learned [facility] and how they plan to apply this method after discharge.  Patient defined balance and what current balance looks like in her life. Patient identified two out-of-balance areas in her life. She identified coping mechanisms that will help her achieve balance.   Hampton AbbotKadijah Emalina Dubreuil, MSW, LCSW-A 05/01/2017, 10:21AM

## 2017-05-01 NOTE — Progress Notes (Signed)
Pt reports feeling depressed today she commits to safety on unit. Pt denies HI and a/v hallucinations. Will continue to monitor for safety.She is medication compliant and visible on unit.

## 2017-05-01 NOTE — Plan of Care (Signed)
Problem: [facility] Participation in Recreation Therapeutic Interventions Goal: STG-Patient will identify at least five coping skills for ** STG: Coping Skills - Within 4 treatment sessions, patient will verbalize at least 5 coping skills for anger in each of 2 treatment sessions to increase anger management skills.  Outcome: Completed/Met Date Met: 05/01/17 Treatment Session 2; Completed 2 out of 2: At approximately 11:20 am, LRT met with patient in craft room. Patient verbalized 5 coping skills for anger. LRT encouraged patient to use her coping skills when she felt herself getting angry to help calm herself down.   M , LRT/CTRS 08.30.18 1:53 pm Goal: STG-Other Recreation Therapy Goal (Specify) STG: Stress Management - Within 4 treatment sessions, patient will verbalize understanding of the stress management techniques in each of 2 treatment sessions to increase stress management skills.  Outcome: Completed/Met Date Met: 05/01/17 Treatment Session 2; Completed 2 out of 2: At approximately 11:20 am, LRT met with patient in craft room. Patient reported she read over and practiced the stress management techniques. Patient verbalized understanding and reported the techniques were helpful. LRT encouraged patient to continue practicing the stress management techniques.   M , LRT/CTRS 08.30.18 1:55 pm   

## 2017-05-02 NOTE — Plan of Care (Signed)
Problem: Safety: Goal: Ability to disclose and discuss suicidal ideas will improve Outcome: Progressing Pt denies SI and HI commits to safety.

## 2017-05-02 NOTE — Discharge Summary (Signed)
Physician Discharge Summary Note  Patient:  Kaylee Hill is an 36 y.o., female MRN:  [id number] DOB:  24-Jun-1981 Patient phone:  [phone] (home)  Patient address:   [address] [place] [address],  Total Time spent with patient: 30 minutes  Date of Admission:  04/28/2017 Date of Discharge: 05/02/17  Reason for Admission:  Psychosis and SI  Principal Problem: Bipolar disorder with psychotic features [facility]) Discharge Diagnoses: Patient Active Problem List   Diagnosis Date Noted  . Borderline diabetes [R73.03] 05/01/2017  . Bipolar disorder with psychotic features (HCC) [F31.9] 04/28/2017  . Benign essential hypertension [I10] 04/09/2013  . Familial multiple lipoprotein-type hyperlipidemia [E78.4] 04/09/2013    History of Present Illness:   Patient is a 36 year old married African-American female with prior history of bipolar disorder. Patient presented voluntarily in the company of her husband to our emergency department on August 26 due to depression and suicidal ideation.  Patient has been without psychiatric treatment for about 2 years. She has a daughter who is 37 months old and since she got pregnant she has not follow-up with psychiatry.    Patient states that during those 2 years she has been paranoid. She feels that people at work are talking about her behind her back, she constantly feels that her job is in jeopardy. She frequently feels that she is not good enough and that somebody is out to get her.   For the last month she has been more depressed. She started hearing voices about 2 days prior to admission. The voices were instructing her to harm herself. The voices sound like her own voice but distorted. Patient says she locked herself in the bathroom with a knife but her husband stopped her.  Patient says that in the past people at work have told her that she speaks too fast, she says that recently prior to admission she was staying up to 2-3 AM in the  morning making guacamole, did that for several nights.  Patient denies the use of any drugs or alcohol.  She has been hospitalized before and diagnosed with bipolar disorder. She recalls being treated effectively with a combination of Abilify, Celexa and Wellbutrin.     Associated Signs/Symptoms: Depression Symptoms:  depressed mood, (Hypo) Manic Symptoms:  denies Anxiety Symptoms:  Excessive Worry, Psychotic Symptoms:  Hallucinations: Auditory PTSD Symptoms: NA Total Time spent with patient: 1 hour  Past Psychiatric History: Patient was hospitalized in [place] for the first time in 2006. At that time she was diagnosed with major depressive disorder with psychotic features. She was hearing voices that were telling her to kill herself. She was then hospitalized a few years later again in [place] at that time she was diagnosed for bipolar disorder with psychotic features.  Patient has had man near suicide attempts before. The first time whereas in high school when she tried to overdose on medications she holds her brother but never took them, she also has held razor blades to cut but was not able to do it.   Past Medical History:  Past Medical History:  Diagnosis Date  . Hypertension    History reviewed. No pertinent surgical history.  Family History: History reviewed. No pertinent family history.  Family Psychiatric  History: Reports that her father was diagnosed with schizophrenia, she has a brother who abuses marijuana and alcohol. Her brother has attempted suicide. Her mother suffered from seasonal affective disorder.  Social History: Patient lives with her husband here in [place]. They have known each  other for 13 years have been married for 5. They have a 79-month-old girl together. 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 Denies any legal history History  Alcohol Use No     History  Drug Use No    Social History   Social History  . Marital  status: Married    Spouse name: N/A  . Number of children: N/A  . Years of education: N/A   Social History Main Topics  . Smoking status: Never Smoker  . Smokeless tobacco: Never Used  . Alcohol use No  . Drug use: No  . Sexual activity: Not Asked   Other Topics Concern  . None   Social History Narrative  . None    Hospital Course:    Patient is a 36 year old woman who suffers from mood symptoms and also psychosis. Potential diagnoses are bipolar disorder or schizoaffective disorder bipolar type.  Bipolar disorder: Continue Celexa 10 mg by mouth daily at bedtime and Abilify  15 mg by mouth daily at bedtime. Patient prefers to take both of these medications at night as she finds them to be very sedating.  Nausea and vomiting: resolved  Borderline diabetes: patient will be continued on metformin 500 mg a day---  Changed to metformin XR due to nausea  Hypertension: Blood pressure has been elevated. Pt has been started on  metoprolol 12.5 mg by mouth daily--VS stable  Labs: TSH wnl, HbA1c 5.8.  Lipid panel mildly increased TG and LDL   EKG wnl qtc   Prescriptions have been sent electronically to her pharmacy.  This hospitalization was uneventful. The patient reports doing much better, no longer having depression, hopelessness, helplessness, suicidality or hallucinations. She is tolerating medications well. She denies any side effects or physical complaints. She denies homicidality problems with mood, sleep, appetite, energy or concentration.  Patient has been attending groups and has been participating appropriately. She has been pleasant, calm and cooperative, compliant with all of her medications.  There is no evidence of psychosis, mania or hypomania.  During examination the patient was calm, pleasant. Her mood is euthymic, her affect was bright and reactive. There is no evidence of delusional thinking or paranoia, her thought process is linear and goal-directed.  There is no evidence of mania or hypomania.  Patient has denied having any access to guns.  Physical Findings: AIMS: Facial and Oral Movements Muscles of Facial Expression: None, normal Lips and Perioral Area: None, normal Jaw: None, normal Tongue: None, normal,Extremity Movements Upper (arms, wrists, hands, fingers): None, normal Lower (legs, knees, ankles, toes): None, normal, Trunk Movements Neck, shoulders, hips: None, normal, Overall Severity Severity of abnormal movements (highest score from questions above): None, normal Incapacitation due to abnormal movements: None, normal Patient's awareness of abnormal movements (rate only patient's report): No Awareness, Dental Status Current problems with teeth and/or dentures?: No Does patient usually wear dentures?: No  CIWA:    COWS:     Musculoskeletal: Strength & Muscle Tone: within normal limits Gait & Station: normal Patient leans: N/A  Psychiatric Specialty Exam: Physical Exam  Nursing note and vitals reviewed. Constitutional: She is oriented to person, place, and time. She appears well-developed and well-nourished.  HENT:  Head: Normocephalic and atraumatic.  Eyes: Conjunctivae and EOM are normal.  Neck: Normal range of motion.  Respiratory: Effort normal.  Musculoskeletal: Normal range of motion.  Neurological: She is alert and oriented to person, place, and time.  Psychiatric: She has a normal mood and affect. Her  speech is normal and behavior is normal. Thought content normal. Cognition and memory are normal. She expresses impulsivity.    Review of Systems  Constitutional: Negative.   HENT: Negative.   Eyes: Negative.   Respiratory: Negative.   Cardiovascular: Negative.   Gastrointestinal: Negative.   Genitourinary: Negative.   Musculoskeletal: Negative.   Skin: Negative.   Neurological: Negative.   Endo/Heme/Allergies: Negative.   Psychiatric/Behavioral: Negative for depression, hallucinations, memory loss,  substance abuse and suicidal ideas. The patient is not nervous/anxious and does not have insomnia.   All other systems reviewed and are negative.   Blood pressure 135/88, pulse 79, temperature 99 F (37.2 C), temperature source Oral, resp. rate 18, height 5\' 1"  (1.549 m), weight 113.4 kg (250 lb), SpO2 100 %.Body mass index is 47.24 kg/m.  General Appearance: Well Groomed  Eye Contact:  Good  Speech:  Clear and Coherent  Volume:  Normal  Mood:  Euthymic  Affect:  Appropriate and Congruent  Thought Process:  Linear and Descriptions of Associations: Intact  Orientation:  Full (Time, Place, and Person)  Thought Content:  Hallucinations: None  Suicidal Thoughts:  No  Homicidal Thoughts:  No  Memory:  Immediate;   Good Recent;   Good Remote;   Good  Judgement:  Good  Insight:  Good  Psychomotor Activity:  Normal  Concentration:  Concentration: Good and Attention Span: Good  Recall:  Good  Fund of Knowledge:  Good  Language:  Good  Akathisia:  No  Handed:    AIMS (if indicated):     Assets:  Communication Skills  ADL's:  Intact  Cognition:  WNL  Sleep:  Number of Hours: 7.5     Have you used any form of tobacco in the last 30 days? (Cigarettes, Smokeless Tobacco, Cigars, and/or Pipes): No  Has this patient used any form of tobacco in the last 30 days? (Cigarettes, Smokeless Tobacco, Cigars, and/or Pipes) Yes, No  Blood Alcohol level:  Lab Results  Component Value Date   ETH <5 04/27/2017    Metabolic Disorder Labs:  Lab Results  Component Value Date   HGBA1C 5.8 (H) 04/29/2017   MPG 120 04/29/2017   No results found for: PROLACTIN Lab Results  Component Value Date   CHOL 182 04/29/2017   TRIG 195 (H) 04/29/2017   HDL 31 (L) 04/29/2017   CHOLHDL 5.9 04/29/2017   VLDL 39 04/29/2017   LDLCALC 112 (H) 04/29/2017   Results for Marcelyn BruinsSMITH, Bana (MRN [id number]) as of 05/01/2017 14:01  Ref. Range 04/27/2017 20:38 04/27/2017 20:51 04/28/2017 17:46 04/29/2017 06:33   COMPREHENSIVE METABOLIC PANEL Unknown Rpt (A)     Sodium Latest Ref Range: 135 - 145 mmol/L 139     Potassium Latest Ref Range: 3.5 - 5.1 mmol/L 3.8     Chloride Latest Ref Range: 101 - 111 mmol/L 107     CO2 Latest Ref Range: 22 - 32 mmol/L 25     Glucose Latest Ref Range: 65 - 99 mg/dL 409106 (H)     Mean Plasma Glucose Latest Units: mg/dL    811120  BUN Latest Ref Range: 6 - 20 mg/dL 8     Creatinine Latest Ref Range: 0.44 - 1.00 mg/dL 9.140.50     Calcium Latest Ref Range: 8.9 - 10.3 mg/dL 9.5     Anion gap Latest Ref Range: 5 - 15  7     Alkaline Phosphatase Latest Ref Range: 38 - 126 U/L 85     Albumin Latest Ref  Range: 3.5 - 5.0 g/dL 3.9     AST Latest Ref Range: 15 - 41 U/L 20     ALT Latest Ref Range: 14 - 54 U/L 12 (L)     Total Protein Latest Ref Range: 6.5 - 8.1 g/dL 7.6     Total Bilirubin Latest Ref Range: 0.3 - 1.2 mg/dL 0.4     GFR, Est African American Latest Ref Range: >60 mL/min >60     GFR, Est Non African American Latest Ref Range: >60 mL/min >60     Total CHOL/HDL Ratio Latest Units: RATIO    5.9  Cholesterol Latest Ref Range: 0 - 200 mg/dL    409  HDL Cholesterol Latest Ref Range: >40 mg/dL    31 (L)  LDL (calc) Latest Ref Range: 0 - 99 mg/dL    811 (H)  Triglycerides Latest Ref Range: <150 mg/dL    914 (H)  VLDL Latest Ref Range: 0 - 40 mg/dL    39  WBC Latest Ref Range: 3.6 - 11.0 K/uL 7.3     RBC Latest Ref Range: 3.80 - 5.20 MIL/uL 5.04     Hemoglobin Latest Ref Range: 12.0 - 16.0 g/dL 78.2 (L)     HCT Latest Ref Range: 35.0 - 47.0 % 34.8 (L)     MCV Latest Ref Range: 80.0 - 100.0 fL 69.0 (L)     MCH Latest Ref Range: 26.0 - 34.0 pg 22.8 (L)     MCHC Latest Ref Range: 32.0 - 36.0 g/dL 95.6     RDW Latest Ref Range: 11.5 - 14.5 % 17.7 (H)     Platelets Latest Ref Range: 150 - 440 K/uL 272     Acetaminophen (Tylenol), S Latest Ref Range: 10 - 30 ug/mL <10 (L)     Salicylate Lvl Latest Ref Range: 2.8 - 30.0 mg/dL <2.1     Hemoglobin H0Q Latest Ref Range: 4.8 - 5.6  %    5.8 (H)  Preg Test, Ur Latest Ref Range: NEGATIVE   NEGATIVE    TSH Latest Ref Range: 0.350 - 4.500 uIU/mL    0.899  Alcohol, Ethyl (B) Latest Ref Range: <5 mg/dL <5     Amphetamines, Ur Screen Latest Ref Range: NONE DETECTED  NONE DETECTED     Barbiturates, Ur Screen Latest Ref Range: NONE DETECTED  NONE DETECTED     Benzodiazepine, Ur Scrn Latest Ref Range: NONE DETECTED  NONE DETECTED     Cocaine Metabolite,Ur [place] Latest Ref Range: NONE DETECTED  NONE DETECTED     Methadone Scn, Ur Latest Ref Range: NONE DETECTED  NONE DETECTED     MDMA (Ecstasy)Ur Screen Latest Ref Range: NONE DETECTED  NONE DETECTED     Cannabinoid 50 Ng, Ur [place] Latest Ref Range: NONE DETECTED  NONE DETECTED     Opiate, Ur Screen Latest Ref Range: NONE DETECTED  NONE DETECTED     Phencyclidine (PCP) Ur S Latest Ref Range: NONE DETECTED  NONE DETECTED     Tricyclic, Ur Screen Latest Ref Range: NONE DETECTED  NONE DETECTED     EKG 12-LEAD Unknown   Rpt    See Psychiatric Specialty Exam and Suicide Risk Assessment completed by Attending Physician prior to discharge.  Discharge destination:  Home  Is patient on multiple antipsychotic therapies at discharge:  No   Has Patient had three or more failed trials of antipsychotic monotherapy by history:  No  Recommended Plan for Multiple Antipsychotic Therapies: NA   Allergies as  of 05/02/2017   No Known Allergies     Medication List    TAKE these medications     Indication  ARIPiprazole 15 MG tablet Commonly known as:  ABILIFY Take 1 tablet (15 mg total) by mouth at bedtime.  Indication:  bipolar   citalopram 10 MG tablet Commonly known as:  CELEXA Take 1 tablet (10 mg total) by mouth at bedtime. What changed:  medication strength  how much to take  when to take this  Indication:  Bipolar   metFORMIN 500 MG 24 hr tablet Commonly known as:  GLUCOPHAGE-XR Take 1 tablet (500 mg total) by mouth daily with breakfast.  Indication:  Antipsychotic  Therapy-Induced Weight Gain   metoprolol succinate 25 MG 24 hr tablet Commonly known as:  TOPROL-XL Take 0.5 tablets (12.5 mg total) by mouth daily.  Indication:  HTN      Follow-up Information    [facility]. Go on 05/08/2017.   Specialty:  Behavioral Health Why:   Please follow-up with therapist, Joni Reining on Thursday September 6th at 10AM at [facility]. Bring discharge paperwork to this appointment. Contact information: [address] [facility] [place] [address] [phone]       [facility] [place]. Go on 06/10/2017.   Why:  Please follow-up with Dr. Percell Belt on Oct. 9th @ 10:45 for medication management. Please bring discharge paperwork, photo ID, and insurance card. If you have to reschedule or cancel, please contact office beforehand. Contact information: [address] [place] [address] [phone]          >30 minutes. >50 % of the time was spent in coordination of care.  Signed: Kristine Linea, MD 05/02/2017, 9:41 AM

## 2017-05-02 NOTE — [facility] Group Notes (Signed)
[facility] LCSW Group Therapy Note  Date/Time: 05/02/17, 0930  Type of Therapy and Topic:  Group Therapy:  Feelings around Relapse and Recovery  Participation Level:  Active   Mood:pleasant  Description of Group:    Patients in this group will discuss emotions they experience before and after a relapse. They will process how experiencing these feelings, or avoidance of experiencing them, relates to having a relapse. Facilitator will guide patients to explore emotions they have related to recovery. Patients will be encouraged to process which emotions are more powerful. They will be guided to discuss the emotional reaction significant others in their lives may have to patients' relapse or recovery. Patients will be assisted in exploring ways to respond to the emotions of others without this contributing to a relapse.  Therapeutic Goals: 1. Patient will identify two or more emotions that lead to relapse for them:  2. Patient will identify two emotions that result when they relapse:  3. Patient will identify two emotions related to recovery:  4. Patient will demonstrate ability to communicate their needs through discussion and/or role plays.   Summary of Patient Progress: Pt shared that fear is an emotion that is difficult to manage for her.  She participated in the group discussion regarding consequences of using substances in order to cope with difficult emotions.  Good participation.     Therapeutic Modalities:   Cognitive Behavioral Therapy Solution-Focused Therapy Assertiveness Training Relapse Prevention Therapy  Daleen SquibbGreg Dwanna Goshert, LCSW

## 2017-05-02 NOTE — Tx Team (Signed)
Interdisciplinary Treatment and Diagnostic Plan Update  05/02/2017 Time of Session: 11:00 AM Kaylee Hill MRN: [id number]  Principal Diagnosis: Bipolar disorder with psychotic features [facility])  Secondary Diagnoses: Principal Problem:   Bipolar disorder with psychotic features ([place]) Active Problems:   Benign essential hypertension   Familial multiple lipoprotein-type hyperlipidemia   Borderline diabetes   Current Medications:  Current Facility-Administered Medications  Medication Dose Route Frequency Provider Last Rate Last Dose  . acetaminophen (TYLENOL) tablet 650 mg  650 mg Oral Q6H PRN Clapacs, John T, MD      . alum & mag hydroxide-simeth (MAALOX/MYLANTA) 200-200-20 MG/5ML suspension 30 mL  30 mL Oral Q4H PRN Clapacs, John T, MD      . ARIPiprazole (ABILIFY) tablet 15 mg  15 mg Oral QHS Hildred Priest, MD   15 mg at 05/01/17 2136  . citalopram (CELEXA) tablet 10 mg  10 mg Oral QHS Hildred Priest, MD   10 mg at 05/01/17 2137  . magnesium hydroxide (MILK OF MAGNESIA) suspension 30 mL  30 mL Oral Daily PRN Clapacs, John T, MD      . metFORMIN (GLUCOPHAGE-XR) 24 hr tablet 500 mg  500 mg Oral Q breakfast Hildred Priest, MD   500 mg at 05/02/17 0834  . metoprolol succinate (TOPROL-XL) 24 hr tablet 12.5 mg  12.5 mg Oral Daily Hildred Priest, MD   12.5 mg at 05/02/17 0834  . ondansetron (ZOFRAN-ODT) disintegrating tablet 4 mg  4 mg Oral Q8H PRN Hildred Priest, MD   4 mg at 04/30/17 0835  . ondansetron (ZOFRAN-ODT) disintegrating tablet 8 mg  8 mg Oral Once Hildred Priest, MD       PTA Medications: Prescriptions Prior to Admission  Medication Sig Dispense Refill Last Dose  . citalopram (CELEXA) 40 MG tablet Take 20 mg by mouth daily.        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   Physician Treatment Plan for Primary Diagnosis: Bipolar disorder with psychotic features ([place]) Long Term Goal(s): Improvement in symptoms so as ready for discharge Improvement in symptoms so as ready for discharge   Short Term Goals: Ability to identify changes in lifestyle to reduce recurrence of condition will improve Ability to identify and develop effective coping behaviors will improve Compliance with prescribed medications will improve Ability to identify triggers associated with substance abuse/mental health issues will improve  Medication Management: Evaluate patient's response, side effects, and tolerance of medication regimen.  Therapeutic Interventions: 1 to 1 sessions, Unit Group sessions and Medication administration.  Evaluation of Outcomes: Adequate for Discharge  Physician Treatment Plan for Secondary Diagnosis: Principal Problem:   Bipolar disorder with psychotic features ([place]) Active Problems:   Benign essential hypertension   Familial multiple lipoprotein-type hyperlipidemia   Borderline diabetes  Long Term Goal(s): Improvement in symptoms so as ready for discharge Improvement in symptoms so as ready for discharge   Short Term Goals: Ability to identify changes in lifestyle to reduce recurrence of condition will improve Ability to identify and develop effective coping behaviors will improve Compliance with prescribed medications will improve Ability to identify triggers associated with substance abuse/mental health issues will improve     Medication Management: Evaluate patient's response, side effects, and tolerance of medication regimen.  Therapeutic Interventions: 1 to 1  sessions, Unit Group sessions and Medication administration.  Evaluation  of Outcomes: Adequate for Discharge   RN Treatment Plan for Primary Diagnosis: Bipolar disorder with psychotic features ([place]) Long Term Goal(s): Knowledge of disease and therapeutic regimen to maintain health will improve  Short Term Goals: Ability to demonstrate self-control, Ability to verbalize feelings will improve and Ability to disclose and discuss suicidal ideas  Medication Management: RN will administer medications as ordered by provider, will assess and evaluate patient's response and provide education to patient for prescribed medication. RN will report any adverse and/or side effects to prescribing provider.  Therapeutic Interventions: 1 on 1 counseling sessions, Psychoeducation, Medication administration, Evaluate responses to treatment, Monitor vital signs and CBGs as ordered, Perform/monitor CIWA, COWS, AIMS and Fall Risk screenings as ordered, Perform wound care treatments as ordered.  Evaluation of Outcomes: Adequate for Discharge   LCSW Treatment Plan for Primary Diagnosis: Bipolar disorder with psychotic features [facility]) Long Term Goal(s): Safe transition to appropriate next level of care at discharge, Engage patient in therapeutic group addressing interpersonal concerns.  Short Term Goals: Engage patient in aftercare planning with referrals and resources, Increase social support and Increase emotional regulation  Therapeutic Interventions: Assess for all discharge needs, 1 to 1 time with Social worker, Explore available resources and support systems, Assess for adequacy in community support network, Educate family and significant other(s) on suicide prevention, Complete Psychosocial Assessment, Interpersonal group therapy.  Evaluation of Outcomes: Adequate for Discharge   Progress in Treatment: Attending groups: Yes. Participating in groups: Yes. Taking medication as prescribed: Yes. Toleration medication: Yes. Family/Significant other contact made: Yes, individual(s)  contacted:  CSW spoke with patient's husband. Patient understands diagnosis: Yes. Discussing patient identified problems/goals with staff: Yes. Medical problems stabilized or resolved: Yes. Denies suicidal/homicidal ideation: Yes. Issues/concerns per patient self-inventory: No.  New problem(s) identified: No, Describe:  None.  New Short Term/Long Term Goal(s): Patient stated that her goal is to feel better overall.  Discharge Plan or Barriers: Patient will discharge home and follow-up with follow-up with outpatient providers.  Reason for Continuation of Hospitalization: discharge today  Estimated Length of Stay: discharge today.  Attendees: Patient: 05/02/2017   Physician: Dr. Bary Leriche, MD 05/02/2017   Nursing: Cleda Mccreedy, RN 05/02/2017   RN Care Manager: 05/02/2017   Social Worker: Lurline Idol, LCSW 05/02/2017   Recreational Therapist: Everitt Amber, LRT/CTRS  05/02/2017   Other:  05/02/2017   Other:  05/02/2017   Other: 05/02/2017        Scribe for Treatment Team: Joanne Chars, [place] 05/02/2017 2:44 PM

## 2017-05-02 NOTE — Progress Notes (Signed)
  [facility]  Will you be returning to the same living situation after discharge:  Yes,  with significant other At discharge, do you have transportation home?: Yes,  family Do you have the ability to pay for your medications: Yes,  [employer]  Release of information consent forms completed and in the chart;  Patient's signature needed at discharge.  Patient to Follow up at: Follow-up Information    [facility]. Go on 05/08/2017.   Specialty:  Behavioral Health Why:   Please follow-up with therapist, Joni Reiningicole on Thursday September 6th at 10AM at [facility]. Bring discharge paperwork to this appointment. Contact information: [address] [facility] [place] [id number] [phone]       [facility]. Go on 06/10/2017.   Why:  Please follow-up with Dr. Percell BeltMillet on Oct. 9th @ 10:45 for medication management. Please bring discharge paperwork, photo ID, and insurance card. If you have to reschedule or cancel, please contact office beforehand. Contact information: [address] [place] [address] [phone]           Next level of care provider has access to [facility] Link:yes  Safety Planning and Suicide Prevention discussed: Yes,  with husband  Have you used any form of tobacco in the last 30 days? (Cigarettes, Smokeless Tobacco, Cigars, and/or Pipes): No  Has patient been referred to the Quitline?: N/A patient is not a smoker  Patient has been referred for addiction treatment: Yes  Lorri FrederickWierda, Aslin Farinas Jon, LCSW 05/02/2017, 2:02 PM

## 2017-05-02 NOTE — Progress Notes (Signed)
Patient alert and oriented x 4, no distress noted, affect is brighter, thoughts are organized and coherent, no bizarre behavior noted, denies SI/HI/AVH, rated depression a 2/10. Patient appears less anxious, interacting appropriately with peers and staff. Patient is visible on the milieu, attended evening group. Will continue to closely monitor

## 2017-05-02 NOTE — Progress Notes (Signed)
Pt denies SI, HI, a/v hallucinations. Pt commits to safety for discharge. Follow up appointments, discharge medications, and treatment reviewed. Pt verbalized understanding of discharge instructions. All belongings returned to pt upon discharge. Pt picked up by husband walked out of main hospital safely.

## 2017-05-02 NOTE — BHH Suicide Risk Assessment (Signed)
[facility]   Principal Problem: Bipolar disorder with psychotic features [facility](HCC) Discharge Diagnoses:  Patient Active Problem List   Diagnosis Date Noted  . Borderline diabetes [R73.03] 05/01/2017  . Bipolar disorder with psychotic features (HCC) [F31.9] 04/28/2017  . Benign essential hypertension [I10] 04/09/2013  . Familial multiple lipoprotein-type hyperlipidemia [E78.4] 04/09/2013      Psychiatric Specialty Exam: ROS  Blood pressure 135/88, pulse 79, temperature 99 F (37.2 C), temperature source Oral, resp. rate 18, height 5\' 1"  (1.549 m), weight 113.4 kg (250 lb), SpO2 100 %.Body mass index is 47.24 kg/m.                                                       Mental Status Per Nursing Assessment::   On Admission:     Demographic Factors:  AAF, married, employeed  Loss Factors: none  Historical Factors: Family history of mental illness or substance abuse and Impulsivity  Risk Reduction Factors:   Responsible for children under 38 years of age, Sense of responsibility to family, Religious beliefs about death, Employed, Living with another person, especially a relative, Positive social support, Positive therapeutic relationship and Positive coping skills or problem solving skills  Continued Clinical Symptoms:  Previous Psychiatric Diagnoses and Treatments  Cognitive Features That Contribute To Risk:  None    Suicide Risk:  Minimal: No identifiable suicidal ideation.  Patients presenting with no risk factors but with morbid ruminations; may be classified as minimal risk based on the severity of the depressive symptoms  Follow-up Information    [facility]. Go on 05/08/2017.   Specialty:  Behavioral Health Why:   Please follow-up with therapist, Joni Reiningicole on Thursday September 6th at 10AM at [facility]. Bring discharge paperwork to this appointment. Contact  information: [address] [facility] [place] [id number] [phone]       [facility]. Go on 06/10/2017.   Why:  Please follow-up with Dr. Percell BeltMillet on Oct. 9th @ 10:45 for medication management. Please bring discharge paperwork, photo ID, and insurance card. If you have to reschedule or cancel, please contact office beforehand. Contact information: [address] [place] [address] [phone]           Kristine LineaJolanta Malaak Stach, MD 05/02/2017, 9:41 AM

## 2017-05-02 NOTE — Progress Notes (Signed)
Recreation Therapy Notes  INPATIENT RECREATION TR PLAN  Patient Details Name: Kaylee Hill MRN: [id number] DOB: 1980-10-29 Today's Date: 05/02/2017  Rec Therapy Plan Is patient appropriate for Therapeutic Recreation?: Yes Treatment times per week: At least once a week TR Treatment/Interventions: 1:1 session, Group participation (Comment) (Appropriate participation in daily recreational therapy tx)  Discharge Criteria Pt will be discharged from therapy if:: Treatment goals are met, Discharged Treatment plan/goals/alternatives discussed and agreed upon by:: Patient/family  Discharge Summary Short term goals set: See Care Plan Short term goals met: Complete Progress toward goals comments: One-to-one attended Which groups?: Self-esteem, Leisure education, Other (Comment) (Self-expression) One-to-one attended: Anger management, stress management Reason goals not met: N/A Therapeutic equipment acquired: None Reason patient discharged from therapy: Discharge from hospital Pt/family agrees with progress & goals achieved: Yes Date patient discharged from therapy: 05/02/17   Leonette Monarch, LRT/CTRS 05/02/2017, 4:07 PM

## 2017-05-08 ENCOUNTER — Ambulatory Visit (INDEPENDENT_AMBULATORY_CARE_PROVIDER_SITE_OTHER): Payer: 59 | Admitting: Licensed Clinical Social Worker

## 2017-05-08 DIAGNOSIS — F259 Schizoaffective disorder, unspecified: Secondary | ICD-10-CM | POA: Diagnosis not present

## 2017-05-08 NOTE — Progress Notes (Signed)
   THERAPIST PROGRESS NOTE  Session Time: 60min  Participation Level: Active  Behavioral Response: CasualAlertAnxious  Type of Therapy: Individual Therapy  Treatment Goals addressed: Coping and Diagnosis: Schizophrenia  Interventions: Solution Focused, Supportive and Reframing  Summary: Kaylee Hill is a 36 y.o. female who presents with symptoms of her diagnosis.  Patient reports that she was recently discharged from the behavior health unit due to SI.  Patient reports that she has been overwhelmed the past few weeks.  SHe denies taking medication.  Reports that she gets frustrated while at work. Denies usage of coping skills. She reports that her husband is supportive.  Modeled breathing technique to assist with anxiety. Reports that she eats breakfast prior to work and allows her self time to transition into work. Reports that she will begin taking medication regularly.  Suicidal/Homicidal: No  Therapist Response: LCSW provided Patient with ongoing emotional support and encouragement.  Normalized her feelings.  Commended Patient on her progress and reinforced the importance of client staying focused on her own strengths and resources and resiliency. Processed various strategies for dealing with stressors.    Plan: Return again in 2 weeks.  Diagnosis: Axis I: Schizoaffective Disorder    Axis II: No diagnosis    Marinda Elkicole M Negar Sieler, LCSW 05/08/2017

## 2017-05-09 NOTE — Social Work (Signed)
Patient requested return to work letter documenting hospitalization; CSW prepared letter, left patient VM  Informing patient letter was ready and requesting any additional information needed.  Awaiting return call.    Santa GeneraAnne Aloys Hupfer, LCSW Lead Clinical Social Worker Phone:  [phone]

## 2017-05-22 ENCOUNTER — Ambulatory Visit: Payer: 59 | Admitting: Licensed Clinical Social Worker

## 2017-06-02 ENCOUNTER — Ambulatory Visit (INDEPENDENT_AMBULATORY_CARE_PROVIDER_SITE_OTHER): Payer: 59 | Admitting: Licensed Clinical Social Worker

## 2017-06-02 DIAGNOSIS — F259 Schizoaffective disorder, unspecified: Secondary | ICD-10-CM | POA: Diagnosis not present

## 2017-06-02 NOTE — Progress Notes (Signed)
   THERAPIST PROGRESS NOTE  Session Time:  Participation Level: Active  Behavioral Response: CasualAlertAnxious  Type of Therapy: Individual Therapy  Treatment Goals addressed: Coping and Diagnosis: Schizophrenia  Interventions: Solution Focused, Supportive and Reframing  Summary: Kaylee Hill is a 36 y.o. female who presents with symptoms of her diagnosis.  Patient reports a "good" mood.  She was able to list her current stressors such as: work and lack of self care.  Patient reports that she gets overwhelmed at work and is unable to calm herself.  Discussion of coping strategies.  Listed coping skills that she has tried.  Patient was able to shadow breathing techniques and stretches that are helpful with tension and relaxation.  Suicidal/Homicidal: No  Therapist Response: Assessed ptt current functioning per pt report.  Processed w/pt how stress impact having on mood.  Explored w/pt other barriers to wellness and encouraged pt to make changes for self  Plan: Return again in 2 weeks.  Diagnosis: Axis I: Schizoaffective Disorder    Axis II: No diagnosis    Marinda Elk, LCSW 06/02/2017

## 2017-06-23 ENCOUNTER — Ambulatory Visit (INDEPENDENT_AMBULATORY_CARE_PROVIDER_SITE_OTHER): Payer: 59 | Admitting: Licensed Clinical Social Worker

## 2017-06-23 DIAGNOSIS — F259 Schizoaffective disorder, unspecified: Secondary | ICD-10-CM

## 2017-06-24 NOTE — Progress Notes (Signed)
   THERAPIST PROGRESS NOTE  Session Time: 68mn  Participation Level: Active  Behavioral Response: CasualAlertAnxious  Type of Therapy: Individual Therapy  Treatment Goals addressed: Coping and Diagnosis: Schizophrenia  Interventions: Solution Focused, Supportive and Reframing  Summary: AJolana Runklesis a 35y.o. female who presents with symptoms of her diagnosis. Patient was present and open for the session. Patient reports that she is uncomfortable with her work situation.  Patient reported his mood as "okay."  Patient reported she would be happier if she received additional training at work.  She reports that she is one of the slowest "counters" at work and at times people ridicule her.  Patient reports that she is aware about MH and factors that affect the course.  Patient reports that she is afraid that her daughter will go to her dad for everything. Patient reported that she is continues to not have negative thoughts and learn parenting skills.  Patient was able to list coping skills that are useful to her.  Patient has made progress evident by her continued compliance with treatment recommendations.  Therapist Response: Therapist met with Patient in a therapy session to assess current mood and to build rapport.  Therapist engaged Patient in discussion about life and what is going well.  Therapist performed a brief mood check assessing happiness, sadness, excitement, anger, disgust, and fear. Therapist provided active listening for Patient as she explained details of her week including current stressors and worries.  Therapist facilitated a discussion on Coping with Stress. Therapist explained how stress is a common problem with everyone but learning how to cope with it can determine outcome.  Therapist assisted Patient with learning her signs of stress.  Therapist assigned homework assignment on Identifying Life Events, Daily Hassles and Strategies for Preventing Stress. Assessed progress  toward goal  Plan: Return again in 2 weeks.  Diagnosis: Axis I: Schizoaffective Disorder    Axis II: No diagnosis    Kaylee South LCSW 06/24/2017

## 2017-07-10 ENCOUNTER — Ambulatory Visit: Payer: 59 | Admitting: Licensed Clinical Social Worker

## 2017-07-29 ENCOUNTER — Ambulatory Visit: Payer: 59 | Admitting: Licensed Clinical Social Worker

## 2017-09-25 ENCOUNTER — Ambulatory Visit
Admission: EM | Admit: 2017-09-25 | Discharge: 2017-09-25 | Disposition: A | Discharge status: Home / Self Care | Payer: 59 | Attending: Family Medicine | Admitting: Family Medicine

## 2017-09-25 ENCOUNTER — Ambulatory Visit (INDEPENDENT_AMBULATORY_CARE_PROVIDER_SITE_OTHER): Payer: 59

## 2017-09-25 ENCOUNTER — Other Ambulatory Visit: Payer: Self-pay

## 2017-09-25 ENCOUNTER — Encounter: Payer: Self-pay | Admitting: Emergency Medicine

## 2017-09-25 DIAGNOSIS — M25561 Pain in right knee: Secondary | ICD-10-CM

## 2017-09-25 MED ORDER — MELOXICAM 15 MG PO TABS: 15.0000 mg | ORAL_TABLET | Freq: Every day | ORAL | 0 refills | Status: DC | PRN

## 2017-09-25 NOTE — ED Triage Notes (Signed)
Patient c/o right knee pain for the past 2 weeks.  Patient reports pain in her knee after recent car trip.

## 2017-09-25 NOTE — Discharge Instructions (Signed)
Mobic.  Rest, ice, elevation.  If persists, see Ortho or sports med.  Take care  Dr. Adriana Simasook

## 2017-09-27 NOTE — ED Provider Notes (Signed)
[facility]    CSN: [id number] Arrival date & time: 09/25/17  1924  History   Chief Complaint Chief Complaint  Patient presents with  . Knee Pain    right   HPI  37 year old female presents with right knee pain.  R knee pain  X 2 weeks.  No known fall/trauma/injury.   She did have a long car ride recently.  No calf swelling, SOB.  No reported swelling, although she states her husband felt it was swollen.  No warmth, redness.  Pain is currently 4/10 in severity.  Pain located anteromedially.  Worse after getting up.  Improves with knee brace.  No response to Naproxen.  Past Medical History:  Diagnosis Date  . Hypertension    Patient Active Problem List   Diagnosis Date Noted  . Borderline diabetes 05/01/2017  . Bipolar disorder with psychotic features (HCC) 04/28/2017  . Benign essential hypertension 04/09/2013  . Familial multiple lipoprotein-type hyperlipidemia 04/09/2013    Past Surgical History:  Procedure Laterality Date  . BREAST SURGERY      OB History    No data available       Home Medications    Prior to Admission medications   Medication Sig Start Date End Date Taking? Authorizing Provider  metFORMIN (GLUCOPHAGE-XR) 500 MG 24 hr tablet Take 1 tablet (500 mg total) by mouth daily with breakfast. 05/02/17  Yes Jimmy Footman, MD  metoprolol succinate (TOPROL-XL) 25 MG 24 hr tablet Take 0.5 tablets (12.5 mg total) by mouth daily. 05/02/17  Yes Jimmy Footman, MD  meloxicam (MOBIC) 15 MG tablet Take 1 tablet (15 mg total) by mouth daily as needed. 09/25/17   Tommie Sams, DO    Family History Family History  Problem Relation Age of Onset  . Diabetes Father     Social History Social History   Tobacco Use  . Smoking status: Never Smoker  . Smokeless tobacco: Never Used  Substance Use Topics  . Alcohol use: No  . Drug use: No     Allergies   Patient has no known allergies.   Review of  Systems Review of Systems  Constitutional: Negative.   Musculoskeletal:       Knee pain.  Skin: Negative.    Physical Exam Triage Vital Signs ED Triage Vitals  Enc Vitals Group     BP 09/25/17 1956 (!) 141/84     Pulse Rate 09/25/17 1956 70     Resp 09/25/17 1956 16     Temp 09/25/17 1956 98.4 F (36.9 C)     Temp Source 09/25/17 1956 Oral     SpO2 09/25/17 1956 100 %     Weight 09/25/17 1955 225 lb (102.1 kg)     Height --      Head Circumference --      Peak Flow --      Pain Score 09/25/17 1955 4     Pain Loc --      Pain Edu? --      Excl. in GC? --    Updated Vital Signs BP (!) 141/84 (BP Location: Left Arm)   Pulse 70   Temp 98.4 F (36.9 C) (Oral)   Resp 16   Wt 225 lb (102.1 kg)   LMP 08/21/2017   SpO2 100%   BMI 42.51 kg/m     Physical Exam  Constitutional: She is oriented to person, place, and time. She appears well-developed and well-nourished. No distress.  Cardiovascular: Normal rate and  regular rhythm.  No murmur heard. Pulmonary/Chest: Effort normal and breath sounds normal. She has no wheezes. She has no rales.  Musculoskeletal:  R knee - Medial joint line tenderness to palpation. Ligaments intact. No appreciable effusion. No warmth or erythema. Negative McMurray's.  Neurological: She is alert and oriented to person, place, and time.  Psychiatric: She has a normal mood and affect. Her behavior is normal.  Nursing note and vitals reviewed.  UC Treatments / Results  Labs (all labs ordered are listed, but only abnormal results are displayed) Labs Reviewed - No data to display  EKG  EKG Interpretation None       Radiology Dg Knee Complete 4 Views Right  Result Date: 09/25/2017 CLINICAL DATA:  Knee pain for 2 weeks EXAM: RIGHT KNEE - COMPLETE 4+ VIEW COMPARISON:  None. FINDINGS: No fracture or malalignment. Minimal patellofemoral spurring. Minimal degenerative change of the medial compartment. No significant effusion. IMPRESSION: Minimal  degenerative changes.  No acute osseous abnormality Electronically Signed   By: Jasmine PangKim  Fujinaga M.D.   On: 09/25/2017 20:39    Procedures Procedures (including critical care time)  Medications Ordered in UC Medications - No data to display   Initial Impression / Assessment and Plan / UC Course  I have reviewed the triage vital signs and the nursing notes.  Pertinent labs & imaging results that were available during my care of the patient were reviewed by me and considered in my medical decision making (see chart for details).     37 year old female presents with acute right knee pain. Xray with minimal degenerative changes. ? Patellofemoral syndrome. Treating with Mobic, RICE. If no improvement will need to see Ortho or SM.  Final Clinical Impressions(s) / UC Diagnoses   Final diagnoses:  Acute pain of right knee    ED Discharge Orders        Ordered    meloxicam (MOBIC) 15 MG tablet  Daily PRN     09/25/17 2043     Controlled Substance Prescriptions [place] Controlled Substance Registry consulted? Not Applicable   Tommie SamsCook, Harriet Sutphen G, DO 09/27/17 [phone]

## 2018-03-13 ENCOUNTER — Ambulatory Visit (HOSPITAL_COMMUNITY)
Admission: EM | Admit: 2018-03-13 | Discharge: 2018-03-13 | Disposition: A | Discharge status: Home / Self Care | Payer: No Typology Code available for payment source | Attending: Family Medicine | Admitting: Family Medicine

## 2018-03-13 ENCOUNTER — Encounter (HOSPITAL_COMMUNITY): Payer: Self-pay | Admitting: Emergency Medicine

## 2018-03-13 DIAGNOSIS — J029 Acute pharyngitis, unspecified: Secondary | ICD-10-CM | POA: Diagnosis present

## 2018-03-13 DIAGNOSIS — Z7984 Long term (current) use of oral hypoglycemic drugs: Secondary | ICD-10-CM | POA: Diagnosis not present

## 2018-03-13 DIAGNOSIS — R7303 Prediabetes: Secondary | ICD-10-CM | POA: Insufficient documentation

## 2018-03-13 DIAGNOSIS — I10 Essential (primary) hypertension: Secondary | ICD-10-CM | POA: Diagnosis not present

## 2018-03-13 DIAGNOSIS — J020 Streptococcal pharyngitis: Secondary | ICD-10-CM | POA: Diagnosis not present

## 2018-03-13 DIAGNOSIS — Z79899 Other long term (current) drug therapy: Secondary | ICD-10-CM | POA: Diagnosis not present

## 2018-03-13 DIAGNOSIS — F319 Bipolar disorder, unspecified: Secondary | ICD-10-CM | POA: Insufficient documentation

## 2018-03-13 DIAGNOSIS — J0100 Acute maxillary sinusitis, unspecified: Secondary | ICD-10-CM | POA: Diagnosis not present

## 2018-03-13 LAB — POCT RAPID STREP A: Streptococcus, Group A Screen (Direct): NEGATIVE

## 2018-03-13 MED ORDER — AMOXICILLIN 875 MG PO TABS: 875.0000 mg | ORAL_TABLET | Freq: Two times a day (BID) | ORAL | 0 refills | Status: DC

## 2018-03-13 NOTE — ED Triage Notes (Signed)
Pt c/o sore throat for a few days, c/o hot/cold chills.

## 2018-03-13 NOTE — Discharge Instructions (Signed)
Take the antibiotic for 7 days Push fluids Continue Flonase Use Tylenol or ibuprofen for pain  May use Chloraseptic spray or lozenges for throat pain Expect improvement over the next couple of days

## 2018-03-13 NOTE — ED Provider Notes (Signed)
[facility]    CSN: [id number] Arrival date & time: 03/13/18  1615     History   Chief Complaint Chief Complaint  Patient presents with  . Sore Throat    HPI Kaylee Hill is a 37 y.o. female.   HPI  Cough cold runny nose sore throat for a week.  Getting worse.  Since yesterday she has had chills and fever.  Severe sore throat.  She is had one episode of vomiting.  She thinks is from the postnasal drip.  She is having a lot of thick yellow drainage.  She has pressure and pain in her face.  Headache.  She feels very tired.  No known exposure to illness.  No exposure to strep.  No underlying asthma or lung disease.  Past Medical History:  Diagnosis Date  . Hypertension     Patient Active Problem List   Diagnosis Date Noted  . Borderline diabetes 05/01/2017  . Bipolar disorder with psychotic features (HCC) 04/28/2017  . Benign essential hypertension 04/09/2013  . Familial multiple lipoprotein-type hyperlipidemia 04/09/2013    Past Surgical History:  Procedure Laterality Date  . BREAST SURGERY      OB History   None      Home Medications    Prior to Admission medications   Medication Sig Start Date End Date Taking? Authorizing Provider  ARIPiprazole (ABILIFY PO) Take by mouth.   Yes [provider]  amoxicillin (AMOXIL) 875 MG tablet Take 1 tablet (875 mg total) by mouth 2 (two) times daily. 03/13/18   Eustace Moore, MD  meloxicam (MOBIC) 15 MG tablet Take 1 tablet (15 mg total) by mouth daily as needed. 09/25/17   Tommie Sams, DO  metFORMIN (GLUCOPHAGE-XR) 500 MG 24 hr tablet Take 1 tablet (500 mg total) by mouth daily with breakfast. 05/02/17   Jimmy Footman, MD  metoprolol succinate (TOPROL-XL) 25 MG 24 hr tablet Take 0.5 tablets (12.5 mg total) by mouth daily. 05/02/17   Jimmy Footman, MD    Family History Family History  Problem Relation Age of Onset  . Diabetes Father     Social History Social History     Tobacco Use  . Smoking status: Never Smoker  . Smokeless tobacco: Never Used  Substance Use Topics  . Alcohol use: No  . Drug use: No     Allergies   Patient has no known allergies.   Review of Systems Review of Systems  Constitutional: Positive for chills, fatigue and fever.  HENT: Positive for postnasal drip, rhinorrhea, sinus pressure and sore throat. Negative for ear pain.   Eyes: Negative for pain and visual disturbance.  Respiratory: Positive for cough. Negative for shortness of breath and wheezing.   Cardiovascular: Negative for chest pain and palpitations.  Gastrointestinal: Positive for nausea and vomiting. Negative for abdominal pain.  Genitourinary: Negative for dysuria and hematuria.  Musculoskeletal: Negative for arthralgias and back pain.  Skin: Negative for color change and rash.  Neurological: Positive for headaches. Negative for seizures and syncope.  Psychiatric/Behavioral: Negative for dysphoric mood. The patient is not nervous/anxious.   All other systems reviewed and are negative.    Physical Exam Triage Vital Signs ED Triage Vitals  Enc Vitals Group     BP 03/13/18 1636 (!) 143/98     Pulse Rate 03/13/18 1636 83     Resp 03/13/18 1636 16     Temp 03/13/18 1636 98.3 F (36.8 C)     Temp Source 03/13/18 1636 Oral  SpO2 03/13/18 1636 100 %     Weight --      Height --      Head Circumference --      Peak Flow --      Pain Score 03/13/18 1637 4     Pain Loc --      Pain Edu? --      Excl. in GC? --    No data found.  Updated Vital Signs BP (!) 143/98 (BP Location: Right Arm)   Pulse 83   Temp 98.3 F (36.8 C) (Oral)   Resp 16   SpO2 100%       Physical Exam  Constitutional: She appears well-developed and well-nourished. She appears ill. No distress.  HENT:  Head: Normocephalic and atraumatic.  Right Ear: Hearing and tympanic membrane normal.  Left Ear: Hearing, tympanic membrane and ear canal normal.  Mouth/Throat: Mucous  membranes are normal. Posterior oropharyngeal erythema present. No oropharyngeal exudate or posterior oropharyngeal edema. Tonsils are 2+ on the right. Tonsils are 2+ on the left. No tonsillar exudate.  Tonsils red and slightly swollen.  No exudate  Eyes: Pupils are equal, round, and reactive to light. Conjunctivae are normal.  Neck: Normal range of motion.  Cardiovascular: Normal rate, regular rhythm and normal heart sounds.  Pulmonary/Chest: Effort normal and breath sounds normal. No respiratory distress.  Abdominal: Soft. She exhibits no distension.  Musculoskeletal: Normal range of motion. She exhibits no edema.  Lymphadenopathy:    She has cervical adenopathy.  Neurological: She is alert.  Skin: Skin is warm and dry.  Psychiatric: She has a normal mood and affect. Her behavior is normal.     UC Treatments / Results  Labs (all labs ordered are listed, but only abnormal results are displayed) Labs Reviewed  CULTURE, GROUP A STREP [facility])  POCT RAPID STREP A    EKG None  Radiology No results found.  Procedures Procedures (including critical care time)  Medications Ordered in UC Medications - No data to display  Initial Impression / Assessment and Plan / UC Course  I have reviewed the triage vital signs and the nursing notes.  Pertinent labs & imaging results that were available during my care of the patient were reviewed by me and considered in my medical decision making (see chart for details).     Discussed that after 7 or 8 days with worsening symptoms and increased fever we will treat with an antibiotic.  Probably started out as a virus.  May still be a virus but at this point will cover with an antibiotic.  Strep is negative. Final Clinical Impressions(s) / UC Diagnoses   Final diagnoses:  Acute non-recurrent maxillary sinusitis  Strep throat     Discharge Instructions     Take the antibiotic for 7 days Push fluids Continue Flonase Use Tylenol or  ibuprofen for pain  May use Chloraseptic spray or lozenges for throat pain Expect improvement over the next couple of days   ED Prescriptions    Medication Sig Dispense Auth. Provider   amoxicillin (AMOXIL) 875 MG tablet Take 1 tablet (875 mg total) by mouth 2 (two) times daily. 14 tablet Eustace MooreNelson, Haskell Rihn Sue, MD     Controlled Substance Prescriptions [place] Controlled Substance Registry consulted? Not Applicable   Eustace MooreNelson, Anum Palecek Sue, MD 03/13/18 1729

## 2018-03-16 LAB — POCT RAPID STREP A: STREPTOCOCCUS, GROUP A SCREEN (DIRECT): NEGATIVE

## 2018-03-16 LAB — CULTURE, GROUP A STREP (THRC)

## 2018-05-18 ENCOUNTER — Encounter: Payer: Self-pay | Admitting: *Deleted

## 2018-05-20 ENCOUNTER — Other Ambulatory Visit: Payer: Self-pay

## 2018-05-20 ENCOUNTER — Encounter: Payer: Self-pay | Admitting: Family Medicine

## 2018-05-20 ENCOUNTER — Ambulatory Visit (INDEPENDENT_AMBULATORY_CARE_PROVIDER_SITE_OTHER): Payer: No Typology Code available for payment source | Admitting: Family Medicine

## 2018-05-20 VITALS — BP 140/82 | HR 86 | Temp 98.7°F | Resp 14 | Ht 62.5 in | Wt 220.0 lb

## 2018-05-20 DIAGNOSIS — E119 Type 2 diabetes mellitus without complications: Secondary | ICD-10-CM | POA: Diagnosis not present

## 2018-05-20 DIAGNOSIS — F319 Bipolar disorder, unspecified: Secondary | ICD-10-CM

## 2018-05-20 DIAGNOSIS — I10 Essential (primary) hypertension: Secondary | ICD-10-CM

## 2018-05-20 DIAGNOSIS — M25562 Pain in left knee: Secondary | ICD-10-CM

## 2018-05-20 DIAGNOSIS — D573 Sickle-cell trait: Secondary | ICD-10-CM

## 2018-05-20 DIAGNOSIS — E7849 Other hyperlipidemia: Secondary | ICD-10-CM

## 2018-05-20 DIAGNOSIS — E669 Obesity, unspecified: Secondary | ICD-10-CM

## 2018-05-20 DIAGNOSIS — Z7689 Persons encountering health services in other specified circumstances: Secondary | ICD-10-CM

## 2018-05-20 DIAGNOSIS — Z6839 Body mass index (BMI) 39.0-39.9, adult: Secondary | ICD-10-CM

## 2018-05-20 MED ORDER — METFORMIN HCL ER 500 MG PO TB24: 500.0000 mg | ORAL_TABLET | Freq: Every day | ORAL | 1 refills | Status: DC

## 2018-05-20 MED ORDER — ARIPIPRAZOLE 5 MG PO TABS: ORAL_TABLET | ORAL | 0 refills | Status: DC

## 2018-05-20 MED FILL — METFORMIN HCL ER 500 MG TAB: 500 | 30 days supply | Qty: 30 | Fill #0

## 2018-05-20 MED FILL — ARIPiprazole 5 MG TABS: 5 | 45 days supply | Qty: 45 | Fill #0
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
## 2018-05-21 LAB — CBC WITH DIFFERENTIAL/PLATELET
Basophils Absolute: 42 cells/uL (ref 0–200)
Basophils Relative: 0.5 %
Eosinophils Absolute: 183 cells/uL (ref 15–500)
Eosinophils Relative: 2.2 %
HCT: 34 % — ABNORMAL LOW (ref 35.0–45.0)
Hemoglobin: 10.8 g/dL — ABNORMAL LOW (ref 11.7–15.5)
Lymphs Abs: 3096 cells/uL (ref 850–3900)
MCH: 22.3 pg — ABNORMAL LOW (ref 27.0–33.0)
MCHC: 31.8 g/dL — ABNORMAL LOW (ref 32.0–36.0)
MCV: 70.1 fL — AB (ref 80.0–100.0)
MPV: 10.7 fL (ref 7.5–12.5)
Monocytes Relative: 8.5 %
NEUTROS PCT: 51.5 %
Neutro Abs: 4275 cells/uL (ref 1500–7800)
Platelets: 322 10*3/uL (ref 140–400)
RBC: 4.85 10*6/uL (ref 3.80–5.10)
RDW: 16.9 % — AB (ref 11.0–15.0)
TOTAL LYMPHOCYTE: 37.3 %
WBC: 8.3 10*3/uL (ref 3.8–10.8)
WBCMIX: 706 {cells}/uL (ref 200–950)

## 2018-05-21 LAB — COMPLETE METABOLIC PANEL WITH GFR
AG Ratio: 1.3 (calc) (ref 1.0–2.5)
ALT: 13 U/L (ref 6–29)
AST: 21 U/L (ref 10–30)
Albumin: 4.2 g/dL (ref 3.6–5.1)
Alkaline phosphatase (APISO): 78 U/L (ref 33–115)
BUN: 12 mg/dL (ref 7–25)
CALCIUM: 9.6 mg/dL (ref 8.6–10.2)
CO2: 24 mmol/L (ref 20–32)
CREATININE: 0.64 mg/dL (ref 0.50–1.10)
Chloride: 104 mmol/L (ref 98–110)
GFR, EST NON AFRICAN AMERICAN: 112 mL/min/{1.73_m2} (ref >=60)
GFR, Est African American: 130 mL/min/{1.73_m2} (ref >=60)
Globulin: 3.2 g/dL (calc) (ref 1.9–3.7)
Glucose, Bld: 84 mg/dL (ref 65–99)
POTASSIUM: 4.3 mmol/L (ref 3.5–5.3)
Sodium: 140 mmol/L (ref 135–146)
Total Bilirubin: 0.3 mg/dL (ref 0.2–1.2)
Total Protein: 7.4 g/dL (ref 6.1–8.1)

## 2018-05-21 LAB — HEMOGLOBIN A1C
EAG (MMOL/L): 7.3 (calc)
Hgb A1c MFr Bld: 6.2 % of total Hgb — ABNORMAL HIGH (ref <5.7)
MEAN PLASMA GLUCOSE: 131 (calc)

## 2018-05-29 ENCOUNTER — Encounter: Payer: Self-pay | Admitting: Family Medicine

## 2018-05-29 ENCOUNTER — Ambulatory Visit: Payer: No Typology Code available for payment source | Admitting: Family Medicine

## 2018-05-29 ENCOUNTER — Ambulatory Visit (INDEPENDENT_AMBULATORY_CARE_PROVIDER_SITE_OTHER): Payer: No Typology Code available for payment source | Admitting: Family Medicine

## 2018-05-29 VITALS — BP 138/98 | HR 76 | Temp 98.2°F | Resp 18 | Ht 63.0 in | Wt 220.6 lb

## 2018-05-29 DIAGNOSIS — M25562 Pain in left knee: Secondary | ICD-10-CM

## 2018-05-29 MED ORDER — TRAMADOL HCL 50 MG PO TABS: 50.0000 mg | ORAL_TABLET | Freq: Three times a day (TID) | ORAL | 0 refills | Status: AC | PRN

## 2018-05-29 MED ORDER — LAMOTRIGINE 100 MG PO TBDP: 200.0000 mg | ORAL_TABLET | Freq: Every day | ORAL | 0 refills | Status: DC

## 2018-05-29 NOTE — Patient Instructions (Addendum)
Still get X-ray Pain meds for severe pain Try some different braces - I would do a neoprene sleeve or ace wrap at night, and use your more sturdy one when up and moving around.  Can do naproxen or aleve - BID for pain, I would try and do this around the clock with tylenol to help minimize pain and inflammation   Elastic Bandage and RICE What does an elastic bandage do? Elastic bandages come in different shapes and sizes. They generally provide support to your injury and reduce swelling while you are healing, but they can perform different functions. Your health care provider will help you to decide what is best for your protection, recovery, or rehabilitation following an injury. What are some general tips for using an elastic bandage?  Use the bandage as directed by the maker of the bandage that you are using.  Do not wrap the bandage too tightly. This may cut off the circulation in the arm or leg in the area below the bandage. ? If part of your body beyond the bandage becomes blue, numb, cold, swollen, or is more painful, your bandage is most likely too tight. If this occurs, remove your bandage and reapply it more loosely.  See your health care provider if the bandage seems to be making your problems worse rather than better.  An elastic bandage should be removed and reapplied every 3-4 hours or as directed by your health care provider. What is RICE? The routine care of many injuries includes rest, ice, compression, and elevation (RICE therapy). Rest Rest is required to allow your body to heal. Generally, you can resume your routine activities when you are comfortable and have been given permission by your health care provider. Ice Icing your injury helps to keep the swelling down and it reduces pain. Do not apply ice directly to your skin.  Put ice in a plastic bag.  Place a towel between your skin and the bag.  Leave the ice on for 20 minutes, 2-3 times per day.  Do this for as  long as you are directed by your health care provider. Compression Compression helps to keep swelling down, gives support, and helps with discomfort. Compression may be done with an elastic bandage. Elevation Elevation helps to reduce swelling and it decreases pain. If possible, your injured area should be placed at or above the level of your heart or the center of your chest. When should I seek medical care? You should seek medical care if:  You have persistent pain and swelling.  Your symptoms are getting worse rather than improving.  These symptoms may indicate that further evaluation or further X-rays are needed. Sometimes, X-rays may not show a small broken bone (fracture) until a number of days later. Make a follow-up appointment with your health care provider. Ask when your X-ray results will be ready. Make sure that you get your X-ray results. When should I seek immediate medical care? You should seek immediate medical care if:  You have a sudden onset of severe pain at or below the area of your injury.  You develop redness or increased swelling around your injury.  You have tingling or numbness at or below the area of your injury that does not improve after you remove the elastic bandage.  This information is not intended to replace advice given to you by your health care provider. Make sure you discuss any questions you have with your health care provider. Document Released: 02/08/2002 Document Revised: 07/15/2016  Document Reviewed: 04/04/2014 Elsevier Interactive Patient Education  2018 [employer] Inc[employer]    Cryotherapy WHAT IS CRYOTHERAPY? Cryotherapy, or cold therapy, is a treatment that uses cold temperatures to treat an injury or medical condition. It includes using cold packs or ice packs to reduce pain and swelling. Only use cryotherapy if your doctor says it is okay. HOW DO I USE CRYOTHERAPY?  Place a towel between the cold source and your skin.  Apply the cold source  for no more than 20 minutes at a time.  Check your skin after 5 minutes to make sure there are no signs of a poor response to cold or skin damage. Check for: ? White spots on your skin. Your skin may look blotchy or mottled. ? Skin that looks blue or pale. ? Skin that feels waxy or hard.  Repeat these steps as many times each day as told by your doctor.  HOW CAN I MAKE A COLD PACK? When using a cold pack at home to reduce pain and swelling, you can use:  A silica gel cold pack that has been left in the freezer. You can buy this online or in stores.  A plastic bag of frozen vegetables.  A sealable plastic bag that has been filled with crushed ice.  Always wrap the pack in a dry or damp towel to avoid direct contact with your skin. WHEN SHOULD I CALL MY DOCTOR? Call your doctor if:  You start to have white spots on your skin. This may give your skin a blotchy or mottled look.  Your skin turns blue or pale.  Your skin becomes waxy or hard.  Your swelling gets worse.  This information is not intended to replace advice given to you by your health care provider. Make sure you discuss any questions you have with your health care provider. Document Released: 02/05/2008 Document Revised: 01/25/2016 Document Reviewed: 05/03/2015 Elsevier Interactive Patient Education  2017 [employer].

## 2018-05-29 NOTE — Progress Notes (Signed)
Patient ID: Kaylee Hill, female    DOB: 1981/04/06, 37 y.o.   MRN: [id number]  PCP: Danelle Berry, PA-C  Chief Complaint  Patient presents with  . left knee pain    Subjective:   Kaylee Hill is a 37 y.o. female, presents to clinic with CC of left knee pain. Patient presents with insidious onset of left knee pain without injury. Pain is become severe, feels sharp usually located right over the patella and just above it, recently has been waking her up at night with very severe and sharp pain.  Also exacerbated by bending the knee beyond 90 degrees, or any twisting motion.  Ports a family history of severe arthritis and early joint replacement surgery.  Her knee feels unstable and with some movements she can feel a kind of catching or popping.  No swelling or redness.  Knee Pain   The incident occurred more than 1 week ago. There was no injury mechanism. The pain is present in the left knee. The quality of the pain is described as aching, stabbing and shooting. The pain is severe. The pain has been worsening since onset. Associated symptoms include a loss of motion. Pertinent negatives include no inability to bear weight, loss of sensation, muscle weakness, numbness or tingling. She reports no foreign bodies present. The symptoms are aggravated by movement and weight bearing (twisting, bending). She has tried NSAIDs (brace) for the symptoms. The treatment provided mild relief.      Patient Active Problem List   Diagnosis Date Noted  . Class 2 obesity with body mass index (BMI) of 39.0 to 39.9 in adult 05/20/2018  . Type 2 diabetes mellitus (HCC) 05/01/2017  . Bipolar disorder with psychotic features (HCC) 04/28/2017  . Sickle cell trait (HCC) 12/09/2014  . Benign essential hypertension 04/09/2013  . Familial multiple lipoprotein-type hyperlipidemia 04/09/2013     Prior to Admission medications   Medication Sig Start Date End Date Taking? Authorizing Provider  ARIPiprazole  (ABILIFY) 5 MG tablet Take 1 tab (5 mg) PO daily x 2w, take 2 tabs (10 mg ) PO daily x 2 w 05/20/18  Yes Danelle Berry, PA-C  lamoTRIgine 200 MG TBDP  07/02/17  Yes [provider]  Melatonin 5 MG TABS Take by mouth.   Yes [provider]  metFORMIN (GLUCOPHAGE-XR) 500 MG 24 hr tablet Take 1 tablet (500 mg total) by mouth daily with breakfast. 05/20/18  Yes Danelle Berry, PA-C     No Known Allergies   Family History  Problem Relation Age of Onset  . Schizophrenia Mother   . Bipolar disorder Mother   . Diabetes Father   . Diabetes Maternal Grandmother   . Cancer Maternal Grandmother        breast, stomach     Social History   Socioeconomic History  . Marital status: Married    Spouse name: Not on file  . Number of children: 1  . Years of education: Not on file  . Highest education level: Some college, no degree  Occupational History  . Occupation: Chief Executive Officer: [employer]  Social Needs  . Financial resource strain: Not hard at all  . Food insecurity:    Worry: Never true    Inability: Never true  . Transportation needs:    Medical: No    Non-medical: No  Tobacco Use  . Smoking status: Former Games developer  . Smokeless tobacco: Never Used  Substance and Sexual Activity  . Alcohol use:  No    Comment: occasionally  . Drug use: No  . Sexual activity: Yes    Birth control/protection: Implant  Lifestyle  . Physical activity:    Days per week: 0 days    Minutes per session: 0 min  . Stress: To some extent  Relationships  . Social connections:    Talks on phone: More than three times a week    Gets together: Once a week    Attends religious service: Never    Active member of club or organization: No    Attends meetings of clubs or organizations: Never    Relationship status: Married  . Intimate partner violence:    Fear of current or ex partner: No    Emotionally abused: No    Physically abused: No    Forced sexual activity: No  Other  Topics Concern  . Not on file  Social History Narrative  . Not on file     Review of Systems  Constitutional: Negative.   HENT: Negative.   Eyes: Negative.   Respiratory: Negative.   Cardiovascular: Negative.   Gastrointestinal: Negative.   Endocrine: Negative.   Genitourinary: Negative.   Musculoskeletal: Negative.   Skin: Negative.   Allergic/Immunologic: Negative.   Neurological: Negative.  Negative for tingling and numbness.  Hematological: Negative.   Psychiatric/Behavioral: Negative.   All other systems reviewed and are negative.      Objective:    Vitals:   05/29/18 1603  BP: (!) 138/98  Pulse: 76  Resp: 18  Temp: 98.2 F (36.8 C)  TempSrc: Oral  SpO2: 99%  Weight: 220 lb 9.6 oz (100.1 kg)  Height: 5\' 3"  (1.6 m)      Physical Exam  Constitutional: She appears well-developed.  HENT:  Head: Normocephalic and atraumatic.  Nose: Nose normal.  Eyes: Conjunctivae are normal. Right eye exhibits no discharge. Left eye exhibits no discharge.  Neck: No tracheal deviation present.  Cardiovascular: Normal rate and regular rhythm.  Pulmonary/Chest: Effort normal. No stridor. No respiratory distress.  Musculoskeletal: Normal range of motion.       Left knee: She exhibits effusion (possible small effusion), abnormal meniscus and MCL laxity. She exhibits normal range of motion and no swelling. Tenderness found. Medial joint line, lateral joint line and MCL tenderness noted.  Range of motion of left knee limited cannot flex to 90 degrees without severe pain Meniscal testing positive especially with valgus stress Popliteal fossa tenderness to palpation No erythema or edema Mildly antalgic gait Normal range of motion of left hip and of left ankle, normal sensation and strength to bilateral lower extremities, good peripheral pulses throughout  Neurological: She is alert. She exhibits normal muscle tone. Coordination normal.  Skin: Skin is warm and dry. No rash noted.    Psychiatric: She has a normal mood and affect. Her behavior is normal.  Nursing note and vitals reviewed.         Assessment & Plan:       ICD-10-CM   1. Acute pain of left knee M25.562 DG Knee 4 Views W/Patella Left  Encouraged to do NSAIDs daily as tolerated by her stomach Encouraged to do rice therapy, compression at night with frequent icing Rest and use stabilizing brace during the day when she has to work Pain meds for severe pain at night which waking her from sleep Pain x-ray with sunrise view  Referred to ortho - will secure own appt with her insurance plan Have concerned for some meniscal injury, or  meniscus and MCL, also has pain most concentrated on the patella so unsure if there is any patellar pathology   Danelle Berry, PA-C 05/29/18 4:14 PM

## 2018-06-03 ENCOUNTER — Ambulatory Visit (HOSPITAL_COMMUNITY)
Admission: RE | Admit: 2018-06-03 | Discharge: 2018-06-03 | Disposition: A | Discharge status: Home / Self Care | Payer: No Typology Code available for payment source | Source: Ambulatory Visit | Attending: Family Medicine | Admitting: Family Medicine

## 2018-06-03 ENCOUNTER — Telehealth: Payer: Self-pay | Admitting: Family Medicine

## 2018-06-03 DIAGNOSIS — M25562 Pain in left knee: Secondary | ICD-10-CM | POA: Diagnosis present

## 2018-06-03 DIAGNOSIS — M1712 Unilateral primary osteoarthritis, left knee: Secondary | ICD-10-CM | POA: Diagnosis not present

## 2018-06-05 ENCOUNTER — Other Ambulatory Visit: Payer: No Typology Code available for payment source

## 2018-06-05 ENCOUNTER — Telehealth: Payer: Self-pay | Admitting: Family Medicine

## 2018-06-05 NOTE — Telephone Encounter (Signed)
Pt calling in for xray results

## 2018-06-05 NOTE — Telephone Encounter (Signed)
Please see result note 

## 2018-06-08 NOTE — Telephone Encounter (Signed)
Left message return call

## 2018-06-08 NOTE — Telephone Encounter (Signed)
Pt lvm stating she was returning Shannon's call on Friday.  CB# [phone]

## 2018-06-12 ENCOUNTER — Encounter: Payer: Self-pay | Admitting: *Deleted

## 2018-07-13 MED FILL — metFORMIN HCL ER 500 MG TB2: 500 | 30 days supply | Qty: 30 | Fill #1

## 2018-07-24 MED FILL — ARIPiprazole 15 MG TABS: 15 | 30 days supply | Qty: 30 | Fill #0

## 2018-08-07 ENCOUNTER — Encounter: Payer: Self-pay | Admitting: Family Medicine

## 2018-08-07 ENCOUNTER — Ambulatory Visit (INDEPENDENT_AMBULATORY_CARE_PROVIDER_SITE_OTHER): Payer: No Typology Code available for payment source | Admitting: Family Medicine

## 2018-08-07 ENCOUNTER — Other Ambulatory Visit: Payer: Self-pay

## 2018-08-07 VITALS — BP 136/94 | HR 88 | Temp 98.4°F | Resp 14 | Ht 63.0 in | Wt 222.0 lb

## 2018-08-07 DIAGNOSIS — Z319 Encounter for procreative management, unspecified: Secondary | ICD-10-CM | POA: Diagnosis not present

## 2018-08-07 DIAGNOSIS — D649 Anemia, unspecified: Secondary | ICD-10-CM | POA: Diagnosis not present

## 2018-08-07 DIAGNOSIS — D509 Iron deficiency anemia, unspecified: Secondary | ICD-10-CM | POA: Insufficient documentation

## 2018-08-07 DIAGNOSIS — Z3046 Encounter for surveillance of implantable subdermal contraceptive: Secondary | ICD-10-CM

## 2018-08-07 DIAGNOSIS — F319 Bipolar disorder, unspecified: Secondary | ICD-10-CM

## 2018-08-07 DIAGNOSIS — R030 Elevated blood-pressure reading, without diagnosis of hypertension: Secondary | ICD-10-CM | POA: Diagnosis not present

## 2018-08-07 MED ORDER — LISINOPRIL 10 MG PO TABS: 10.0000 mg | ORAL_TABLET | Freq: Every day | ORAL | 3 refills | Status: DC

## 2018-08-07 MED FILL — LISINOPRIL 10 MG TABS: 10 | 90 days supply | Qty: 90 | Fill #0

## 2018-08-07 NOTE — Patient Instructions (Signed)
130/80 - would start the lisinopril and follow up with me in 1 month  If under do not start and we can keep watching.  Return stool test

## 2018-08-07 NOTE — Progress Notes (Signed)
Patient ID: Kaylee Hill, female    DOB: 09/02/1981, 37 y.o.   MRN: [id number]  PCP: Danelle Berryapia, Durene Dodge, PA-C  Chief Complaint  Patient presents with  . Follow-up    is fasting- needs labs  . Pre-conception Counseling    Subjective:   Kaylee Hill is a 37 y.o. female, presents to clinic with CC of   Abilify 15 mg - psychiatrist Dr. 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    Knees - went to ortho - has bone spurs, trying to loose weight, pain has improved, she notices an increase in pain when weight goes up a few lbs.  nexplanon runs out Dec 15th - she needs to get established with OBGYN to take out and she and her husband are thinking about getting pregnant again, but she is concerned about her age.    BP has been elevated at our visits, she has no HA, CP, SOB, LE edema.  She does not monitor it at all.    Anemia with last labs - hx of anemia noted in chart.  She has hx of sickle cell trait, no other known hx related to anemia.  She does have a history of heavy periods but on Nexplanon she is having some light periods or intermittent spotting.  She denies any dark stool, melena, hematochezia.  She denies any syncopal episodes, lightheadedness, chest pain, rapid heart rate.   Patient Active Problem List   Diagnosis Date Noted  . Class 2 obesity with body mass index (BMI) of 39.0 to 39.9 in adult 05/20/2018  . Type 2 diabetes mellitus (HCC) 05/01/2017  . Bipolar disorder with psychotic features (HCC) 04/28/2017  . Sickle cell trait (HCC) 12/09/2014  . Benign essential hypertension 04/09/2013  . Familial multiple lipoprotein-type hyperlipidemia 04/09/2013    Current Meds  Medication Sig  . ARIPiprazole  (ABILIFY) 15 MG tablet   . lamoTRIgine 100 MG TBDP Take 2 tablets (200 mg total) by mouth at bedtime.  . lamoTRIgine 200 MG TBDP   . Melatonin 5 MG TABS Take by mouth.  . metFORMIN (GLUCOPHAGE-XR) 500 MG 24 hr tablet Take 1 tablet (500 mg total) by mouth daily with breakfast.     Review of Systems  Constitutional: Negative.   HENT: Negative.   Eyes: Negative.   Respiratory: Negative.   Cardiovascular: Negative.   Gastrointestinal: Negative.   Endocrine: Negative.   Genitourinary: Negative.   Musculoskeletal: Negative.   Skin: Negative.   Allergic/Immunologic: Negative.   Neurological: Negative.   Hematological: Negative.   Psychiatric/Behavioral: Negative.   All other systems reviewed and are negative.      Objective:    Vitals:   08/07/18 1609  BP: (!) 136/94  Pulse: 88  Resp: 14  Temp: 98.4 F (36.9 C)  TempSrc: Oral  SpO2: 98%  Weight: 222 lb (100.7 kg)  Height: 5\' 3"  (1.6 m)      Physical Exam  Constitutional: She appears well-developed and well-nourished. No distress.  HENT:  Head: Normocephalic and atraumatic.  Nose: Nose normal.  Mouth/Throat: Oropharynx is clear and moist.  Eyes: Conjunctivae are normal. Right eye exhibits no discharge. Left eye exhibits no discharge.  Neck: Normal range of motion. No tracheal  deviation present.  Cardiovascular: Normal rate, regular rhythm, normal heart sounds and intact distal pulses.  Pulmonary/Chest: Effort normal. No stridor. No respiratory distress.  Musculoskeletal: Normal range of motion.  Neurological: She is alert. She exhibits normal muscle tone. Coordination normal.  Skin: Skin is warm and dry. No rash noted. She is not diaphoretic.  Psychiatric: She has a normal mood and affect. Her behavior is normal.  Nursing note and vitals reviewed.         Assessment & Plan:       ICD-10-CM   1. Nexplanon removal Z30.46 Ambulatory referral to Obstetrics / Gynecology   OB/GYN for removal, condoms at exp date    2. Desire for pregnancy Z31.9 Ambulatory referral to Obstetrics / Gynecology   f/up OBGYN to discuss risk with age  85. Anemia, unspecified type D64.9 Fecal Globin By Immunochemistry    CBC (INCLUDES DIFF/PLT) WITH PATHOLOGIST REVIEW    Vitamin B12    Reticulocytes    Iron, TIBC and Ferritin Panel    Folate    VITAMIN D 25 Hydroxy (Vit-D Deficiency, Fractures)   microcytic anemia with last labs, hx of anemia in the past, work up initiated  4. Elevated BP without diagnosis of hypertension R03.0    monitor, if > 130/80 start lisinopril and f/up in 2-4 weeks - call in, drop off or send mychart msg with bp readings  5. Bipolar disorder with psychotic features (HCC) F31.9    now est with psychiatry - they are managing, improving with abilify 15 mg and lamictal 200 mg   Pt well appearing, mood much better, knee pain improved.  She returned today to mainly get fasting labs done - she had FLP pending and done today.  Anemia work up initiated, pt asx, appears hemodynamically stable, no known or suspected blood loss, more suspicious for iron deficiency.     Danelle Berry, PA-C 08/07/18 4:17 PM

## 2018-08-10 LAB — CBC (INCLUDES DIFF/PLT) WITH PATHOLOGIST REVIEW
Basophils Absolute: 32 cells/uL (ref 0–200)
Basophils Relative: 0.4 %
Eosinophils Absolute: 158 cells/uL (ref 15–500)
Eosinophils Relative: 2 %
HCT: 34.1 % — ABNORMAL LOW (ref 35.0–45.0)
HEMOGLOBIN: 10.8 g/dL — AB (ref 11.7–15.5)
Lymphs Abs: 2670 cells/uL (ref 850–3900)
MCH: 22.5 pg — ABNORMAL LOW (ref 27.0–33.0)
MCHC: 31.7 g/dL — ABNORMAL LOW (ref 32.0–36.0)
MCV: 71 fL — ABNORMAL LOW (ref 80.0–100.0)
MPV: 10.4 fL (ref 7.5–12.5)
Monocytes Relative: 5.6 %
Neutro Abs: 4598 cells/uL (ref 1500–7800)
Neutrophils Relative %: 58.2 %
Platelets: 337 10*3/uL (ref 140–400)
RBC: 4.8 10*6/uL (ref 3.80–5.10)
RDW: 16.2 % — ABNORMAL HIGH (ref 11.0–15.0)
Total Lymphocyte: 33.8 %
WBC mixed population: 442 cells/uL (ref 200–950)
WBC: 7.9 10*3/uL (ref 3.8–10.8)

## 2018-08-10 LAB — RETICULOCYTES
ABS Retic: 43200 cells/uL (ref 20000–8000)
Retic Ct Pct: 0.9 %

## 2018-08-10 LAB — VITAMIN D 25 HYDROXY (VIT D DEFICIENCY, FRACTURES): Vit D, 25-Hydroxy: 13 ng/mL — ABNORMAL LOW (ref 30–100)

## 2018-08-10 LAB — FOLATE: FOLATE: 17.6 ng/mL

## 2018-08-10 LAB — IRON,TIBC AND FERRITIN PANEL
%SAT: 8 % (calc) — ABNORMAL LOW (ref 16–45)
Ferritin: 17 ng/mL (ref 16–154)
Iron: 36 ug/dL — ABNORMAL LOW (ref 40–190)
TIBC: 473 mcg/dL (calc) — ABNORMAL HIGH (ref 250–450)

## 2018-08-10 LAB — VITAMIN B12: Vitamin B-12: 826 pg/mL (ref 200–1100)

## 2018-08-12 ENCOUNTER — Encounter: Payer: Self-pay | Admitting: Family Medicine

## 2018-08-12 DIAGNOSIS — E559 Vitamin D deficiency, unspecified: Secondary | ICD-10-CM | POA: Insufficient documentation

## 2018-08-12 MED ORDER — VITAMIN D (ERGOCALCIFEROL) 1.25 MG (50000 UNIT) PO CAPS: 50000.0000 [IU] | ORAL_CAPSULE | ORAL | 0 refills | Status: DC

## 2018-08-12 MED FILL — VIT D2 1.25 MG (50,000 UNIT: 1.25 MG | 84 days supply | Qty: 12 | Fill #0

## 2018-08-12 NOTE — Addendum Note (Signed)
Addended by: Danelle BerryAPIA, Aldean Pipe on: 08/12/2018 05:16 PM   Modules accepted: Orders

## 2018-08-17 ENCOUNTER — Other Ambulatory Visit: Payer: No Typology Code available for payment source

## 2018-08-17 ENCOUNTER — Encounter: Payer: Self-pay | Admitting: Family Medicine

## 2018-08-18 LAB — FECAL GLOBIN BY IMMUNOCHEMISTRY
FECAL GLOBIN RESULT:: NOT DETECTED
MICRO NUMBER:: 91502217
SPECIMEN QUALITY:: ADEQUATE

## 2018-08-19 ENCOUNTER — Other Ambulatory Visit: Payer: Self-pay | Admitting: Family Medicine

## 2018-08-19 DIAGNOSIS — E119 Type 2 diabetes mellitus without complications: Secondary | ICD-10-CM

## 2018-08-19 MED FILL — metFORMIN HCL ER 500 MG TB2: 500 | 30 days supply | Qty: 30 | Fill #0

## 2018-08-31 ENCOUNTER — Ambulatory Visit: Payer: No Typology Code available for payment source | Admitting: Women's Health

## 2018-08-31 ENCOUNTER — Encounter: Payer: Self-pay | Admitting: Women's Health

## 2018-08-31 VITALS — BP 130/91 | HR 84 | Ht 61.0 in | Wt 225.0 lb

## 2018-08-31 DIAGNOSIS — Z3049 Encounter for surveillance of other contraceptives: Secondary | ICD-10-CM

## 2018-08-31 DIAGNOSIS — I10 Essential (primary) hypertension: Secondary | ICD-10-CM

## 2018-08-31 DIAGNOSIS — R7303 Prediabetes: Secondary | ICD-10-CM

## 2018-08-31 DIAGNOSIS — Z319 Encounter for procreative management, unspecified: Secondary | ICD-10-CM

## 2018-08-31 DIAGNOSIS — Z3046 Encounter for surveillance of implantable subdermal contraceptive: Secondary | ICD-10-CM

## 2018-08-31 MED ORDER — LABETALOL HCL 200 MG PO TABS: 200.0000 mg | ORAL_TABLET | Freq: Two times a day (BID) | ORAL | 3 refills | Status: DC

## 2018-08-31 MED FILL — LABETALOL HCL 200 MG TABS: 200 | 30 days supply | Qty: 60 | Fill #0

## 2018-08-31 NOTE — Patient Instructions (Signed)
Stop lisinopril, switch to Labetalol 200mg  twice daily Continue vitamins daily Start exercising Decrease  carbohydrates (breads, rice, pasta, potatoes, sweets, candies, sodas, etc)

## 2018-08-31 NOTE — Progress Notes (Signed)
   NEXPLANON REMOVAL Patient name: Marcelyn Bruinsundrea Noone MRN [id number]  Date of birth: 10/18/1980 Subjective Findings:   Marcelyn Bruinsundrea Kosmicki is a 37 y.o. [id number] African American female being seen today for removal of a Nexplanon. Her Nexplanon was placed Dec 2016.  She desires removal because it's time and she also wants to get pregnant. Just recently started on lisinopril for HTN, hasn't taken today yet. On metformin 500mg  daily for pre-diabetes, last A1C 6.2 in Sept. Taking daily mulitivitamin. Took 245yr to get pregnant last time. Signed copy of informed consent in chart.   Patient's last menstrual period was 08/26/2018. Pertinent History Reviewed:   Reviewed past medical,surgical, social, obstetrical and family history.  Reviewed problem list, medications and allergies. Objective Findings & Procedure:    Vitals:   08/31/18 1037  BP: (!) 130/91  Pulse: 84  Weight: 225 lb (102.1 kg)  Height: 5\' 1"  (1.549 m)  Body mass index is 42.51 kg/m.  No results found for this or any previous visit (from the past 24 hour(s)).   Time out was performed.  Nexplanon site identified.  Area prepped in usual sterile fashon. One cc of 2% lidocaine was used to anesthetize the area at the distal end of the implant. A small stab incision was made right beside the implant on the distal portion.  The Nexplanon rod was grasped using hemostats and removed without difficulty.  There was less than 3 cc blood loss. There were no complications.  Steri-strips were applied over the small incision and a pressure bandage was applied.  The patient tolerated the procedure well. Assessment & Plan:   1) Nexplanon removal She was instructed to keep the area clean and dry, remove pressure bandage in 24 hours, and keep insertion site covered with the steri-strip for 3-5 days.   Follow-up PRN problems.  2) Desires pregnancy> discussed meds w/ Dr. Despina HiddenEure, stop lisinopril, switch to Labetalol 200mg  BID, Abilify/Lamictal ok. Advised weight loss,  decreasing carbs, increasing activity, pnv daily. F/u 2wks for bp check  No orders of the defined types were placed in this encounter.   Follow-up: Return in about 2 weeks (around 09/14/2018) for F/U.  Cheral MarkerKimberly R Brigett Estell CNM, [facility] CenterWHNP-BC 08/31/2018 11:56 AM

## 2018-09-08 ENCOUNTER — Encounter: Payer: Self-pay | Admitting: Family Medicine

## 2018-09-09 MED FILL — ARIPiprazole 15 MG TABS: 15 | 30 days supply | Qty: 30 | Fill #1

## 2018-09-10 ENCOUNTER — Ambulatory Visit (INDEPENDENT_AMBULATORY_CARE_PROVIDER_SITE_OTHER): Payer: No Typology Code available for payment source | Admitting: Family Medicine

## 2018-09-10 ENCOUNTER — Encounter: Payer: Self-pay | Admitting: Family Medicine

## 2018-09-10 VITALS — BP 120/82 | HR 76 | Temp 98.4°F | Ht 63.0 in | Wt 225.1 lb

## 2018-09-10 DIAGNOSIS — R1032 Left lower quadrant pain: Secondary | ICD-10-CM

## 2018-09-10 DIAGNOSIS — K649 Unspecified hemorrhoids: Secondary | ICD-10-CM

## 2018-09-10 DIAGNOSIS — D509 Iron deficiency anemia, unspecified: Secondary | ICD-10-CM

## 2018-09-10 DIAGNOSIS — K625 Hemorrhage of anus and rectum: Secondary | ICD-10-CM

## 2018-09-10 LAB — URINALYSIS, ROUTINE W REFLEX MICROSCOPIC
Bilirubin Urine: NEGATIVE
Glucose, UA: NEGATIVE
Hgb urine dipstick: NEGATIVE
Ketones, ur: NEGATIVE
Leukocytes, UA: NEGATIVE
Nitrite: NEGATIVE
Protein, ur: NEGATIVE
SPECIFIC GRAVITY, URINE: 1.015 (ref 1.001–1.03)
pH: 7 (ref 5.0–8.0)

## 2018-09-10 LAB — CBC WITH DIFFERENTIAL/PLATELET
Absolute Monocytes: 585 cells/uL (ref 200–950)
Basophils Absolute: 27 cells/uL (ref 0–200)
Basophils Relative: 0.4 %
Eosinophils Absolute: 122 cells/uL (ref 15–500)
Eosinophils Relative: 1.8 %
HCT: 32.8 % — ABNORMAL LOW (ref 35.0–45.0)
Hemoglobin: 10.3 g/dL — ABNORMAL LOW (ref 11.7–15.5)
Lymphs Abs: 2210 cells/uL (ref 850–3900)
MCH: 22.5 pg — ABNORMAL LOW (ref 27.0–33.0)
MCHC: 31.4 g/dL — ABNORMAL LOW (ref 32.0–36.0)
MCV: 71.8 fL — AB (ref 80.0–100.0)
MONOS PCT: 8.6 %
MPV: 10.3 fL (ref 7.5–12.5)
NEUTROS ABS: 3856 {cells}/uL (ref 1500–7800)
Neutrophils Relative %: 56.7 %
Platelets: 334 10*3/uL (ref 140–400)
RBC: 4.57 10*6/uL (ref 3.80–5.10)
RDW: 16.8 % — ABNORMAL HIGH (ref 11.0–15.0)
Total Lymphocyte: 32.5 %
WBC: 6.8 10*3/uL (ref 3.8–10.8)

## 2018-09-10 LAB — HEMOCCULT GUIAC POC 1CARD (OFFICE): Fecal Occult Blood, POC: POSITIVE — AB

## 2018-09-10 LAB — PREGNANCY, URINE: Preg Test, Ur: NEGATIVE

## 2018-09-10 MED ORDER — POLYETHYLENE GLYCOL 3350 17 GM/SCOOP PO POWD: 17.0000 g | Freq: Every day | ORAL | 1 refills | Status: DC

## 2018-09-10 MED ORDER — HYDROCORTISONE ACETATE 25 MG RE SUPP: 25.0000 mg | Freq: Two times a day (BID) | RECTAL | 0 refills | Status: DC | PRN

## 2018-09-10 MED FILL — HYDROCORTISONE ACETATE 25 M: 25 | 6 days supply | Qty: 12 | Fill #0

## 2018-09-10 NOTE — Patient Instructions (Signed)
Decrease dosing of iron supplement to 3x a week or every other day.  Drink ample clear fluids.  Use preparation H ointment to help protect and soothe your external hemorrhoids, use Anusol sparingly this week it should help with the inflamed tissue.    I want you to go get the abdominal x-ray done to assess degree of constipation.   If there is a large amount of stool burden there then again you need to start doing MiraLAX to soften stool to help have less traumatic bowel movements.    I am going to check your blood levels today.  If there is any concerning decrease in your red blood cells or if you have any worsening of blood in bowel movements having bloody diarrhea then we will likely need to get you seen urgently by GI.  For now I suspect that the stool issues were caused by iron supplementation, the stool issues exacerbated hx of hemorrhoids.   Hemorrhoids Hemorrhoids are swollen veins that may develop:  In the butt (rectum). These are called internal hemorrhoids.  Around the opening of the butt (anus). These are called external hemorrhoids. Hemorrhoids can cause pain, itching, or bleeding. Most of the time, they do not cause serious problems. They usually get better with diet changes, lifestyle changes, and other home treatments. What are the causes? This condition may be caused by:  Having trouble pooping (constipation).  Pushing hard (straining) to poop.  Watery poop (diarrhea).  Pregnancy.  Being very overweight (obese).  Sitting for long periods of time.  Heavy lifting or other activity that causes you to strain.  Anal sex.  Riding a bike for a long period of time. What are the signs or symptoms? Symptoms of this condition include:  Pain.  Itching or soreness in the butt.  Bleeding from the butt.  Leaking poop.  Swelling in the area.  One or more lumps around the opening of your butt. How is this diagnosed? A doctor can often diagnose this condition by  looking at the affected area. The doctor may also:  Do an exam that involves feeling the area with a gloved hand (digital rectal exam).  Examine the area inside your butt using a small tube (anoscope).  Order blood tests. This may be done if you have lost a lot of blood.  Have you get a test that involves looking inside the colon using a flexible tube with a camera on the end (sigmoidoscopy or colonoscopy). How is this treated? This condition can usually be treated at home. Your doctor may tell you to change what you eat, make lifestyle changes, or try home treatments. If these do not help, procedures can be done to remove the hemorrhoids or make them smaller. These may involve:  Placing rubber bands at the base of the hemorrhoids to cut off their blood supply.  Injecting medicine into the hemorrhoids to shrink them.  Shining a type of light energy onto the hemorrhoids to cause them to fall off.  Doing surgery to remove the hemorrhoids or cut off their blood supply. Follow these instructions at home: Eating and drinking   Eat foods that have a lot of fiber in them. These include whole grains, beans, nuts, fruits, and vegetables.  Ask your doctor about taking products that have added fiber (fibersupplements).  Reduce the amount of fat in your diet. You can do this by: ? Eating low-fat dairy products. ? Eating less red meat. ? Avoiding processed foods.  Drink enough fluid to  keep your pee (urine) pale yellow. Managing pain and swelling   Take a warm-water bath (sitz bath) for 20 minutes to ease pain. Do this 3-4 times a day. You may do this in a bathtub or using a portable sitz bath that fits over the toilet.  If told, put ice on the painful area. It may be helpful to use ice between your warm baths. ? Put ice in a plastic bag. ? Place a towel between your skin and the bag. ? Leave the ice on for 20 minutes, 2-3 times a day. General instructions  Take over-the-counter and  prescription medicines only as told by your doctor. ? Medicated creams and medicines may be used as told.  Exercise often. Ask your doctor how much and what kind of exercise is best for you.  Go to the bathroom when you have the urge to poop. Do not wait.  Avoid pushing too hard when you poop.  Keep your butt dry and clean. Use wet toilet paper or moist towelettes after pooping.  Do not sit on the toilet for a long time.  Keep all follow-up visits as told by your doctor. This is important. Contact a doctor if you:  Have pain and swelling that do not get better with treatment or medicine.  Have trouble pooping.  Cannot poop.  Have pain or swelling outside the area of the hemorrhoids. Get help right away if you have:  Bleeding that will not stop. Summary  Hemorrhoids are swollen veins in the butt or around the opening of the butt.  They can cause pain, itching, or bleeding.  Eat foods that have a lot of fiber in them. These include whole grains, beans, nuts, fruits, and vegetables.  Take a warm-water bath (sitz bath) for 20 minutes to ease pain. Do this 3-4 times a day. This information is not intended to replace advice given to you by your health care provider. Make sure you discuss any questions you have with your health care provider. Document Released: 05/28/2008 Document Revised: 01/08/2018 Document Reviewed: 01/08/2018 Elsevier Interactive Patient Education  2019 [employer]

## 2018-09-10 NOTE — Progress Notes (Signed)
Patient ID: Kaylee Hill, female    DOB: Jul 01, 1981, 37 y.o.   MRN: [id number]  PCP: Kaylee Berry, PA-C  Chief Complaint  Patient presents with  . Rectal Bleeding    Subjective:   Kaylee Hill is a 38 y.o. female, presents to clinic with CC of BRBPR with suspected hemorrhoids that are inflamed secondary to some constipation (although pt is unsure).  Also complains of 3 days of mild abdominal pain, fullness, feels constipated, having multiple BM's a day, some solid, some looser, with blood dripping into toilet sometimes and blood on toilet paper when she wipes other times.  Pain in stomach is achy, fully, minimally crampy, located to LLQ, suprapubic area and sometimes radiates up the center of her stomach to her epigastrium.  Patient states that she has had some straining to have bowel movements.  She does have a history of hemorrhoids in the past.  She believes the stool is slightly darker than normal but denies melena or gross hematochezia.  She did just recently start supplementing with daily iron for iron deficiency anemia. She denies any fever chills sweats back pain vomiting weight loss urinary symptoms.  She did get her Nexplanon out and is sexually active with her husband, but she does not believe she can be pregnant although she would like to be.    Patient Active Problem List   Diagnosis Date Noted  . Vitamin D deficiency 08/12/2018  . Iron deficiency anemia, unspecified 08/07/2018  . Class 2 obesity with body mass index (BMI) of 39.0 to 39.9 in adult 05/20/2018  . Type 2 diabetes mellitus (HCC) 05/01/2017  . Bipolar disorder with psychotic features (HCC) 04/28/2017  . Sickle cell trait (HCC) 12/09/2014  . Benign essential hypertension 04/09/2013  . Familial multiple lipoprotein-type hyperlipidemia 04/09/2013     Prior to Admission medications   Medication Sig Start Date End Date Taking? Authorizing Provider  ARIPiprazole (ABILIFY) 15 MG tablet Take 15 mg by mouth daily.   07/24/18   [provider]  IRON PO Take 325 mg by mouth daily.    [provider]  labetalol (NORMODYNE) 200 MG tablet Take 1 tablet (200 mg total) by mouth 2 (two) times daily. 08/31/18   Cheral Marker, CNM  lamoTRIgine 100 MG TBDP Take 2 tablets (200 mg total) by mouth at bedtime. 05/29/18   Kaylee Berry, PA-C  lisinopril (PRINIVIL,ZESTRIL) 10 MG tablet Take 1 tablet (10 mg total) by mouth daily. 08/07/18   Kaylee Berry, PA-C  Melatonin 5 MG TABS Take by mouth as needed.     [provider]  metFORMIN (GLUCOPHAGE-XR) 500 MG 24 hr tablet TAKE 1 TABLET BY MOUTH DAILY WITH BREAKFAST. 08/19/18   Kaylee Berry, PA-C  Vitamin D, Ergocalciferol, (DRISDOL) 1.25 MG (50000 UT) CAPS capsule Take 1 capsule (50,000 Units total) by mouth every 7 (seven) days. x12 weeks. 08/12/18   Kaylee Berry, PA-C     No Known Allergies   Family History  Problem Relation Age of Onset  . Bipolar disorder Mother   . Diabetes Father   . Hypertension Father   . Schizophrenia Father   . Asthma Brother   . Diabetes Maternal Grandmother   . Cancer Maternal Grandmother        breast, stomach  . Dementia Maternal Grandfather      Social History   Socioeconomic History  . Marital status: Married    Spouse name: Not on file  . Number of children: 1  . Years of  education: Not on file  . Highest education level: Some college, no degree  Occupational History  . Occupation: Chief Executive Officer: [employer]  Social Needs  . Financial resource strain: Not hard at all  . Food insecurity:    Worry: Never true    Inability: Never true  . Transportation needs:    Medical: No    Non-medical: No  Tobacco Use  . Smoking status: Former Smoker    Types: Cigarettes  . Smokeless tobacco: Never Used  Substance and Sexual Activity  . Alcohol use: No    Comment: occasionally  . Drug use: No  . Sexual activity: Yes    Birth control/protection: Implant  Lifestyle  . Physical  activity:    Days per week: 0 days    Minutes per session: 0 min  . Stress: To some extent  Relationships  . Social connections:    Talks on phone: More than three times a week    Gets together: Once a week    Attends religious service: Never    Active member of club or organization: No    Attends meetings of clubs or organizations: Never    Relationship status: Married  . Intimate partner violence:    Fear of current or ex partner: No    Emotionally abused: No    Physically abused: No    Forced sexual activity: No  Other Topics Concern  . Not on file  Social History Narrative  . Not on file     Review of Systems  Constitutional: Negative.   HENT: Negative.   Eyes: Negative.   Respiratory: Negative.   Cardiovascular: Negative.   Gastrointestinal: Negative.   Endocrine: Negative.   Genitourinary: Negative.   Musculoskeletal: Negative.   Skin: Negative.   Allergic/Immunologic: Negative.   Neurological: Negative.   Hematological: Negative.   Psychiatric/Behavioral: Negative.   All other systems reviewed and are negative.      Objective:    Vitals:   09/10/18 0959  BP: 120/82  Pulse: 76  Temp: 98.4 F (36.9 C)  TempSrc: Oral  SpO2: 97%  Weight: 225 lb 2 oz (102.1 kg)  Height: 5\' 3"  (1.6 m)      Physical Exam Vitals signs and nursing note reviewed.  Constitutional:      General: She is not in acute distress.    Appearance: She is well-developed. She is not ill-appearing, toxic-appearing or diaphoretic.  HENT:     Head: Normocephalic and atraumatic.     Nose: Nose normal.  Eyes:     General:        Right eye: No discharge.        Left eye: No discharge.     Conjunctiva/sclera: Conjunctivae normal.  Neck:     Trachea: No tracheal deviation.  Cardiovascular:     Rate and Rhythm: Normal rate and regular rhythm.  Pulmonary:     Effort: Pulmonary effort is normal. No respiratory distress.     Breath sounds: No stridor.  Abdominal:     General:  Bowel sounds are normal. There is no distension.     Palpations: Abdomen is soft. There is no shifting dullness, fluid wave, hepatomegaly, splenomegaly or pulsatile mass.     Tenderness: There is abdominal tenderness in the left lower quadrant. There is guarding (mild voluntary guarding). There is no right CVA tenderness, left CVA tenderness or rebound. Negative signs include Murphy's sign and Rovsing's sign.     Hernia: No hernia is  present.  Genitourinary:    Rectum: Guaiac result positive. Tenderness and external hemorrhoid present. No mass or internal hemorrhoid. Normal anal tone.     Comments: No fecal impaction palpated in rectal vault.  Brown soft stool Multiple external hemorrhoids and skin tags, mild tenderness with DRE, no internal hemorrhoids palpated Musculoskeletal: Normal range of motion.  Skin:    General: Skin is warm and dry.     Findings: No rash.  Neurological:     Mental Status: She is alert.     Motor: No abnormal muscle tone.     Coordination: Coordination normal.  Psychiatric:        Behavior: Behavior normal.           Assessment & Plan:   Pt with abd pain, bloating, some constipation/straining, but also several BM's a day that are softer, pt complains of BRBPR - drips into bowl, on toilet paper, no melena - but stool appears darker since pt has been taking oral iron supplement daily.    DRE evidence of likely old and new external hemorrhoids I did not feel any internal hemorrhoids and there was no fecal impaction.  I suspect there will be positive Hemoccult and it was positive.  No gross blood.  Patient not having any symptoms of her physical exam findings concerning for worsening anemia but I will check her CBC again today.  I suspect that large amount of her abdominal symptoms have come from the iron supplement and she was encouraged to decrease it to 3 times a week if she can tolerate that and if not to hold it while we work-up abd pain and BRBPR.       ICD-10-CM   1. Left lower quadrant abdominal pain R10.32 polyethylene glycol powder (GLYCOLAX/MIRALAX) powder    DG Abd 1 View    Hemoccult - 1 Card (office)    Urinalysis, Routine w reflex microscopic    Pregnancy, urine   suspect constipation secondary to daily iron supplementation  2. BRBPR (bright red blood per rectum) K62.5 CBC with Differential    POCT occult blood stool    Hemoccult - 1 Card (office)   evidence of hemorrhoids on exam  3. Iron deficiency anemia, unspecified iron deficiency anemia type D50.9 CBC with Differential    Hemoccult - 1 Card (office)   continue iron but only take 3x a week  4. Hemorrhoids, unspecified hemorrhoid type K64.9 polyethylene glycol powder (GLYCOLAX/MIRALAX) powder    hydrocortisone (ANUSOL-HC) 25 MG suppository    POCT occult blood stool    Hemoccult - 1 Card (office)   tx with OTC hemorrhoid ointment and anusol suppository, miralax to soften stool, increase fluids, decrease iron     Urinalysis, Routine w reflex microscopic  Result Value Ref Range   Color, Urine YELLOW YELLOW   APPearance CLEAR CLEAR   Specific Gravity, Urine 1.015 1.001 - 1.03   pH 7.0 5.0 - 8.0   Glucose, UA NEGATIVE NEGATIVE   Bilirubin Urine NEGATIVE NEGATIVE   Ketones, ur NEGATIVE NEGATIVE   Hgb urine dipstick NEGATIVE NEGATIVE   Protein, ur NEGATIVE NEGATIVE   Nitrite NEGATIVE NEGATIVE   Leukocytes, UA NEGATIVE NEGATIVE  Pregnancy, urine  Result Value Ref Range   Preg Test, Ur NEGATIVE NEGATIVE  POCT occult blood stool  Result Value Ref Range   Fecal Occult Blood, POC Positive (A) Negative   Card #1 Date 09/10/2018    Card #2 Fecal Occult Blod, POC     Card #2 Date  Card #3 Fecal Occult Blood, POC     Card #3 Date     Ua unremarkable.  Pt going to get [facility] where [facility] Close follow up to see if sx and bleeding improve with decreased iron, treatment for hemorrhoids and MiraLAX  Kaylee BerryLeisa Christiano Blandon, PA-C 09/10/18 10:12 AM

## 2018-09-15 ENCOUNTER — Ambulatory Visit: Payer: No Typology Code available for payment source | Admitting: Women's Health

## 2018-09-15 ENCOUNTER — Encounter: Payer: Self-pay | Admitting: Family Medicine

## 2018-09-15 ENCOUNTER — Encounter: Payer: Self-pay | Admitting: Women's Health

## 2018-09-15 VITALS — BP 132/93 | HR 79 | Ht 61.0 in | Wt 223.6 lb

## 2018-09-15 DIAGNOSIS — Z0130 Encounter for examination of blood pressure without abnormal findings: Secondary | ICD-10-CM

## 2018-09-15 NOTE — Progress Notes (Signed)
   GYN VISIT Patient name: Kaylee Hill MRN [id number]  Date of birth: 11-21-1980 Chief Complaint:   Blood Pressure Check  History of Present Illness:   Kaylee Hill is a 38 y.o. [id number] African American female being seen today for bp check after switching bp med from lisinopril to labetalol 200mg  BID on 09/10/18 d/t trying to conceive. Taking bp's at home, 120s/70s. Hasn't taken Labetalol this am, had to rush out of house/hasn't had breakfast. Taking women's vitamin daily.      Patient's last menstrual period was 08/26/2018. The current method of family planning is none. Last pap >57yrs ago. Results were:  normal Review of Systems:   Pertinent items are noted in HPI Denies fever/chills, dizziness, headaches, visual disturbances, fatigue, shortness of breath, chest pain, abdominal pain, vomiting, abnormal vaginal discharge/itching/odor/irritation, problems with periods, bowel movements, urination, or intercourse unless otherwise stated above.  Pertinent History Reviewed:  Reviewed past medical,surgical, social, obstetrical and family history.  Reviewed problem list, medications and allergies. Physical Assessment:   Vitals:   09/15/18 1054  BP: (!) 132/93  Pulse: 79  Weight: 223 lb 9.6 oz (101.4 kg)  Height: 5\' 1"  (1.549 m)  Body mass index is 42.25 kg/m.       Physical Examination:   General appearance: alert, well appearing, and in no distress  Mental status: alert, oriented to person, place, and time  Skin: warm & dry   Cardiovascular: normal heart rate noted  Respiratory: normal respiratory effort, no distress  Abdomen: soft, non-tender   Pelvic: examination not indicated  Extremities: no edema   No results found for this or any previous visit (from the past 24 hour(s)).  Assessment & Plan:  1) CHTN, bp check> bp slightly elevated here today, but didn't take Labetalol this am. Home bp's 120s/70s  2) Desire for pregnancy> make sure MVI currently taking has at least  400-879mcg folic acid, if not- switch to PNV  Meds: No orders of the defined types were placed in this encounter.   No orders of the defined types were placed in this encounter.   Return for 1st available pap & physical.- take Labetalol at least 1hr before appt  Cheral Marker CNM, [facility] 09/15/2018 11:16 AM

## 2018-09-15 NOTE — Patient Instructions (Signed)
Take blood pressure medicine at least 1 hour before your appointment

## 2018-09-16 ENCOUNTER — Encounter: Payer: Self-pay | Admitting: Family Medicine

## 2018-09-17 ENCOUNTER — Ambulatory Visit (HOSPITAL_COMMUNITY)
Admission: RE | Admit: 2018-09-17 | Discharge: 2018-09-17 | Disposition: A | Discharge status: Home / Self Care | Payer: No Typology Code available for payment source | Source: Ambulatory Visit | Attending: Family Medicine | Admitting: Family Medicine

## 2018-09-17 DIAGNOSIS — R1032 Left lower quadrant pain: Secondary | ICD-10-CM | POA: Diagnosis not present

## 2018-09-25 ENCOUNTER — Encounter: Payer: Self-pay | Admitting: Family Medicine

## 2018-09-25 ENCOUNTER — Ambulatory Visit (INDEPENDENT_AMBULATORY_CARE_PROVIDER_SITE_OTHER): Payer: No Typology Code available for payment source | Admitting: Family Medicine

## 2018-09-25 VITALS — BP 120/98 | HR 73 | Temp 97.6°F | Resp 15 | Ht 63.0 in | Wt 223.2 lb

## 2018-09-25 DIAGNOSIS — K625 Hemorrhage of anus and rectum: Secondary | ICD-10-CM

## 2018-09-25 DIAGNOSIS — D509 Iron deficiency anemia, unspecified: Secondary | ICD-10-CM

## 2018-09-25 DIAGNOSIS — K649 Unspecified hemorrhoids: Secondary | ICD-10-CM

## 2018-09-25 NOTE — Progress Notes (Signed)
Patient ID: Kaylee Hill, female    DOB: [birth year], 38 y.o.   MRN: [id number]  PCP: Danelle Berryapia, Delma Drone, PA-C  Chief Complaint  Patient presents with  . Rectal Bleeding    Patient in today for a recheck on rectal bleeding. Has not taken iron in 1 week    Subjective:   09/25/18 Kaylee Bruinsundrea Chhim is a 38 y.o. female here for recheck on abdominal pain, hemorrhoids, constipation, and BRBPR.  She had + hemoccult at last visit.  Has held iron for 1 week and is here to recheck.     HPI 09/10/2018: Kaylee Bruinsundrea Cosner is a 38 y.o. female, presents to clinic with CC of BRBPR with suspected hemorrhoids that are inflamed secondary to some constipation (although pt is unsure).  Also complains of 3 days of mild abdominal pain, fullness, feels constipated, having multiple BM's a day, some solid, some looser, with blood dripping into toilet sometimes and blood on toilet paper when she wipes other times.  Pain in stomach is achy, fully, minimally crampy, located to LLQ, suprapubic area and sometimes radiates up the center of her stomach to her epigastrium.  Patient states that she has had some straining to have bowel movements.  She does have a history of hemorrhoids in the past.  She believes the stool is slightly darker than normal but denies melena or gross hematochezia.  She did just recently start supplementing with daily iron for iron deficiency anemia. She denies any fever chills sweats back pain vomiting weight loss urinary symptoms.  She did get her Nexplanon out and is sexually active with her husband, but she does not believe she can be pregnant although she would like to be.    Patient Active Problem List   Diagnosis Date Noted  . Vitamin D deficiency 08/12/2018  . Iron deficiency anemia, unspecified 08/07/2018  . Class 2 obesity with body mass index (BMI) of 39.0 to 39.9 in adult 05/20/2018  . Type 2 diabetes mellitus (HCC) 05/01/2017  . Bipolar disorder with psychotic features (HCC) 04/28/2017  .  Sickle cell trait (HCC) 12/09/2014  . Benign essential hypertension 04/09/2013  . Familial multiple lipoprotein-type hyperlipidemia 04/09/2013     Prior to Admission medications   Medication Sig Start Date End Date Taking? Authorizing Provider  ARIPiprazole (ABILIFY) 15 MG tablet Take 15 mg by mouth daily.  07/24/18   [provider]  IRON PO Take 325 mg by mouth daily.    [provider]  labetalol (NORMODYNE) 200 MG tablet Take 1 tablet (200 mg total) by mouth 2 (two) times daily. 08/31/18   Cheral MarkerBooker, Kimberly R, CNM  lamoTRIgine 100 MG TBDP Take 2 tablets (200 mg total) by mouth at bedtime. 05/29/18   Danelle Berryapia, Aadarsh Cozort, PA-C  lisinopril (PRINIVIL,ZESTRIL) 10 MG tablet Take 1 tablet (10 mg total) by mouth daily. 08/07/18   Danelle Berryapia, Elizar Alpern, PA-C  Melatonin 5 MG TABS Take by mouth as needed.     [provider]  metFORMIN (GLUCOPHAGE-XR) 500 MG 24 hr tablet TAKE 1 TABLET BY MOUTH DAILY WITH BREAKFAST. 08/19/18   Danelle Berryapia, Shayne Diguglielmo, PA-C  Vitamin D, Ergocalciferol, (DRISDOL) 1.25 MG (50000 UT) CAPS capsule Take 1 capsule (50,000 Units total) by mouth every 7 (seven) days. x12 weeks. 08/12/18   Danelle Berryapia, Icess Bertoni, PA-C     No Known Allergies   Family History  Problem Relation Age of Onset  . Bipolar disorder Mother   . Diabetes Father   . Hypertension Father   . Schizophrenia Father   .  Asthma Brother   . Diabetes Maternal Grandmother   . Cancer Maternal Grandmother        breast, pancreatic cancer  . Dementia Maternal Grandfather      Social History   Socioeconomic History  . Marital status: Married    Spouse name: Not on file  . Number of children: 1  . Years of education: Not on file  . Highest education level: Some college, no degree  Occupational History  . 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 Financial resource strain: Not hard at all  . Food insecurity:    Worry: Never true    Inability: Never true  . Transportation needs:     Medical: No    Non-medical: No  Tobacco Use  . Smoking status: Former Smoker    Types: Cigarettes  . Smokeless tobacco: Never Used  Substance and Sexual Activity  . Alcohol use: No    Comment: occasionally  . Drug use: No  . Sexual activity: Yes    Birth control/protection: Implant  Lifestyle  . Physical activity:    Days per week: 0 days    Minutes per session: 0 min  . Stress: To some extent  Relationships  . Social connections:    Talks on phone: More than three times a week    Gets together: Once a week    Attends religious service: Never    Active member of club or organization: No    Attends meetings of clubs or organizations: Never    Relationship status: Married  . Intimate partner violence:    Fear of current or ex partner: No    Emotionally abused: No    Physically abused: No    Forced sexual activity: No  Other Topics Concern  . Not on file  Social History Narrative  . Not on file     Review of Systems  Constitutional: Negative.   HENT: Negative.   Eyes: Negative.   Respiratory: Negative.   Cardiovascular: Negative.   Gastrointestinal: Negative.   Endocrine: Negative.   Genitourinary: Negative.   Musculoskeletal: Negative.   Skin: Negative.   Allergic/Immunologic: Negative.   Neurological: Negative.   Hematological: Negative.   Psychiatric/Behavioral: Negative.   All other systems reviewed and are negative.      Objective:    Vitals:   09/25/18 1608  BP: (!) 120/98  Pulse: 73  Resp: 15  Temp: 97.6 F (36.4 C)  TempSrc: Oral  SpO2: 98%  Weight: 223 lb 4 oz (101.3 kg)  Height: 5\' 3"  (1.6 m)      Physical Exam Vitals signs and nursing note reviewed.  Constitutional:      Appearance: She is well-developed.  HENT:     Head: Normocephalic and atraumatic.     Nose: Nose normal.  Eyes:     General:        Right eye: No discharge.        Left eye: No discharge.     Conjunctiva/sclera: Conjunctivae normal.  Neck:     Trachea: No  tracheal deviation.  Cardiovascular:     Rate and Rhythm: Normal rate and regular rhythm.  Pulmonary:     Effort: Pulmonary effort is normal. No respiratory distress.     Breath sounds: No stridor.  Abdominal:     General: Bowel sounds are normal. There is no distension.     Tenderness: There is no abdominal tenderness. There is no right CVA tenderness, left  CVA tenderness, guarding or rebound.  Musculoskeletal: Normal range of motion.  Skin:    General: Skin is warm and dry.     Findings: No rash.  Neurological:     Mental Status: She is alert.     Motor: No abnormal muscle tone.     Coordination: Coordination normal.  Psychiatric:        Behavior: Behavior normal.           Assessment & Plan:   Pt reports improved bowels, softer stools daily, no pain, hemorrhoids improving, she is only had a few episodes of scant amount of blood streaking on the toilet paper and no more hematochezia or visible blood in toilet with bowel movements.  Plan to recheck Hemoccult with take-home test If that is positive will send to GI If negative patient will start trial of supplement with fusion -sample given  Instructed to call us 2 weeks after starting fusion to let us know how she is tolerating it, and to recheck CBC 1 month after starting iron supplementation again.  Problem List Items Addressed This Visit      Other   Iron deficiency anemia, unspecified   Relevant Orders   Fecal Globin By Immunochemistry   CBC with Differential    Other Visit Diagnoses    BRBPR (bright red blood per rectum)    -  Primary   resolved   Relevant Orders   Fecal Globin By Immunochemistry   Fecal Globin By Immunochemistry   Hemorrhoids, unspecified hemorrhoid type       improving        Danelle Berry, PA-C 09/25/18 4:13 PM

## 2018-09-25 NOTE — Patient Instructions (Signed)
Recheck hemoccult now that hemorrhoid and constipation is better  If hemoccult negative - trial of started Fushion iron supplement - you call us to let us know how you are tolerating it after about 1-2 weeks and then I will order a recheck CBC

## 2018-10-02 MED FILL — metFORMIN HCL ER 500 MG TB2: 500 | 30 days supply | Qty: 30 | Fill #1

## 2018-10-31 MED FILL — LABETALOL HCL 200 MG TABS: 200 | 30 days supply | Qty: 60 | Fill #1 | Status: TO

## 2018-11-05 ENCOUNTER — Encounter: Payer: Self-pay | Admitting: Women's Health

## 2018-11-05 ENCOUNTER — Ambulatory Visit (INDEPENDENT_AMBULATORY_CARE_PROVIDER_SITE_OTHER): Payer: No Typology Code available for payment source | Admitting: Women's Health

## 2018-11-05 ENCOUNTER — Other Ambulatory Visit (HOSPITAL_COMMUNITY)
Admission: RE | Admit: 2018-11-05 | Discharge: 2018-11-05 | Disposition: A | Discharge status: Home / Self Care | Payer: No Typology Code available for payment source | Source: Ambulatory Visit | Attending: Obstetrics & Gynecology | Admitting: Obstetrics & Gynecology

## 2018-11-05 VITALS — BP 133/94 | HR 81 | Ht 61.0 in | Wt 229.0 lb

## 2018-11-05 DIAGNOSIS — I10 Essential (primary) hypertension: Secondary | ICD-10-CM

## 2018-11-05 DIAGNOSIS — Z01419 Encounter for gynecological examination (general) (routine) without abnormal findings: Secondary | ICD-10-CM | POA: Diagnosis present

## 2018-11-05 DIAGNOSIS — Z3202 Encounter for pregnancy test, result negative: Secondary | ICD-10-CM

## 2018-11-05 DIAGNOSIS — Z6841 Body Mass Index (BMI) 40.0 and over, adult: Secondary | ICD-10-CM

## 2018-11-05 DIAGNOSIS — Z113 Encounter for screening for infections with a predominantly sexual mode of transmission: Secondary | ICD-10-CM

## 2018-11-05 DIAGNOSIS — E119 Type 2 diabetes mellitus without complications: Secondary | ICD-10-CM

## 2018-11-05 LAB — POCT URINE PREGNANCY: Preg Test, Ur: NEGATIVE

## 2018-11-05 NOTE — Patient Instructions (Signed)
Start prenatal vitamins at least a month before trying to get pregnant

## 2018-11-05 NOTE — Progress Notes (Signed)
WELL-WOMAN EXAMINATION Patient name: Kaylee Hill MRN [id number]  Date of birth: 09-Apr-1981 Chief Complaint:   Gynecologic Exam  History of Present Illness:   Kaylee Hill is a 38 y.o. G3P1001 African American female being seen today for a routine well-woman exam.  Current complaints: nexplanon removed 08/31/18 d/t wanting to get pregnant, was on lisinopril for [facility] so she was switched to labetalol 200mg  BID, doesn't take consistently b/c felt she needed to take w/ food and meals for her can be at very irregular times. Did take labetalol at 0940 today. Wants to lose weight prior to trying for pregnancy. Has gained 4lbs since Nexplanon removed. Knows her diet is bad, doesn't exercise, wants to talk w/ dietician. Not taking pnv yet. Not having sex  PCP: [facility]      Desires STD labs, all other labs managed by PCP Patient's last menstrual period was 10/22/2018. The current method of family planning is abstinence Last pap >29yrs ago. Results were: normal Last mammogram: never. Results were: n/a. Family h/o breast cancer: Yes MGM Last colonoscopy: never. Results were: n/a. Family h/o colorectal cancer: No Review of Systems:   Pertinent items are noted in HPI Denies any headaches, blurred vision, fatigue, shortness of breath, chest pain, abdominal pain, abnormal vaginal discharge/itching/odor/irritation, problems with periods, bowel movements, urination, or intercourse unless otherwise stated above. Pertinent History Reviewed:  Reviewed past medical,surgical, social and family history.  Reviewed problem list, medications and allergies. Physical Assessment:   Vitals:   11/05/18 1040  BP: (!) 133/94  Pulse: 81  Weight: 229 lb (103.9 kg)  Height: 5\' 1"  (1.549 m)  Body mass index is 43.27 kg/m.        Physical Examination:   General appearance - well appearing, and in no distress  Mental status - alert, oriented to person, place, and time  Psych:  She has a normal mood and  affect  Skin - warm and dry, normal color, no suspicious lesions noted  Chest - effort normal, all lung fields clear to auscultation bilaterally  Heart - normal rate and regular rhythm  Neck:  midline trachea, no thyromegaly or nodules  Breasts - breasts appear normal, no suspicious masses, no skin or nipple changes or  axillary nodes; scars from breast reduction  Abdomen - soft, nontender, nondistended, no masses or organomegaly  Pelvic - VULVA: normal appearing vulva with no masses, tenderness or lesions  VAGINA: normal appearing vagina with normal color and discharge, no lesions  CERVIX: normal appearing cervix without discharge or lesions, no CMT  Thin prep pap is done w/ HR HPV cotesting  UTERUS: uterus is felt to be normal size, shape, consistency and nontender   ADNEXA: No adnexal masses or tenderness noted.  Extremities:  No swelling or varicosities noted  Results for orders placed or performed in visit on 11/05/18 (from the past 24 hour(s))  POCT urine pregnancy   Collection Time: 11/05/18 10:53 AM  Result Value Ref Range   Preg Test, Ur Negative Negative    Assessment & Plan:  1) Well-Woman Exam  2) Obesity, BMI 43> recommended weight loss, increase activity, dietician referral placed  3) CHTN> on labetalol 200mg  BID d/t wanting to try for pregnancy, ok to take w/o food  4) T2DM> on metformin, last A1C ago 6.2   5) Desire for pregnancy> start pnv at least prior to trying for pregnancy, let us know when +HPT  Labs/procedures today: pap, STD screen  Mammogram @40yo  (in June) or sooner if  problems Colonoscopy @45 -50yo or sooner if problems  Orders Placed This Encounter  Procedures  . HIV Antibody (routine testing w rflx)  . RPR  . Hepatitis B surface antigen  . Amb ref to Medical Nutrition Therapy-MNT  . POCT urine pregnancy    Follow-up: Return in about 1 year (around 11/05/2019) for Physical.  Cheral Marker CNM, WHNP-BC 11/05/2018 11:21 AM

## 2018-11-06 LAB — RPR: RPR Ser Ql: NONREACTIVE

## 2018-11-06 LAB — HIV ANTIBODY (ROUTINE TESTING W REFLEX): HIV Screen 4th Generation wRfx: NONREACTIVE

## 2018-11-06 LAB — HEPATITIS B SURFACE ANTIGEN: Hepatitis B Surface Ag: NEGATIVE

## 2018-11-09 LAB — CYTOLOGY - PAP
CHLAMYDIA, DNA PROBE: NEGATIVE
Diagnosis: NEGATIVE
HPV: NOT DETECTED
Neisseria Gonorrhea: NEGATIVE

## 2018-11-10 MED FILL — ARIPiprazole 15 MG TABS: 15 | 30 days supply | Qty: 30 | Fill #2 | Status: TO

## 2018-11-11 ENCOUNTER — Encounter: Payer: No Typology Code available for payment source | Attending: Family Medicine | Admitting: Skilled Nursing Facility1

## 2018-11-11 ENCOUNTER — Encounter: Payer: Self-pay | Admitting: Skilled Nursing Facility1

## 2018-11-11 ENCOUNTER — Encounter: Payer: Self-pay | Admitting: Family Medicine

## 2018-11-11 DIAGNOSIS — Z6839 Body mass index (BMI) 39.0-39.9, adult: Secondary | ICD-10-CM | POA: Insufficient documentation

## 2018-11-11 DIAGNOSIS — E669 Obesity, unspecified: Secondary | ICD-10-CM | POA: Insufficient documentation

## 2018-11-11 DIAGNOSIS — E559 Vitamin D deficiency, unspecified: Secondary | ICD-10-CM

## 2018-11-11 DIAGNOSIS — R0683 Snoring: Secondary | ICD-10-CM

## 2018-11-11 DIAGNOSIS — E7849 Other hyperlipidemia: Secondary | ICD-10-CM

## 2018-11-11 DIAGNOSIS — Z72820 Sleep deprivation: Secondary | ICD-10-CM

## 2018-11-11 DIAGNOSIS — D509 Iron deficiency anemia, unspecified: Secondary | ICD-10-CM

## 2018-11-11 DIAGNOSIS — E119 Type 2 diabetes mellitus without complications: Secondary | ICD-10-CM

## 2018-11-11 DIAGNOSIS — I10 Essential (primary) hypertension: Secondary | ICD-10-CM

## 2018-11-11 MED ORDER — FUSION PLUS PO CAPS: 1.0000 | ORAL_CAPSULE | Freq: Every day | ORAL | 5 refills | Status: DC

## 2018-11-11 MED FILL — FUSION PLUS CAPSULE: 30 days supply | Qty: 30 | Fill #0 | Status: TO

## 2018-11-11 NOTE — Telephone Encounter (Signed)
Pt asking for refills on iron and Vit D rx -   She is due for recheck of labs for anemia and will need Vit D rechecked. She has not done fasting labs that were put in when she first established care.    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    Problem List Items Addressed This Visit      Cardiovascular and Mediastinum   Benign essential hypertension   Relevant Orders   Comprehensive metabolic panel     Endocrine   Type 2 diabetes mellitus (HCC) - Primary   Relevant Orders   Comprehensive metabolic panel   Hemoglobin A1c     Other   Familial multiple lipoprotein-type hyperlipidemia   Relevant Orders   Comprehensive metabolic panel   Lipid panel   Iron deficiency anemia, unspecified   Relevant Medications   Iron-FA-B Cmp-C-Biot-Probiotic (FUSION PLUS) CAPS   Other Relevant Orders   CBC   Vitamin D deficiency   Relevant Orders   Vitamin D 1,25 dihydroxy     Rx for Fusion Plus sent to her pharmacy and paper printed Rx to be mailed to her house as well, she was encouraged to call around to pharmacies to see who has it, and go online looking for discount if insurance doesn't cover it.

## 2018-11-11 NOTE — Addendum Note (Signed)
Addended by: Danelle Berry on: 11/11/2018 02:29 PM   Modules accepted: Orders

## 2018-11-11 NOTE — Patient Instructions (Addendum)
-  Talk to your doctor about getting a sleep study  -Have a snack at 3pm  -Is this hunger or appetite using your should I eat handout  -Have non starchy vegetables with every dinner every day

## 2018-11-11 NOTE — Progress Notes (Signed)
  Assessment:  Primary concerns today: obesity.   Pt states she needs a meal plan stating she has no structure. Pt states she cannot seem to stick to a diet such as weight watchers, noom (hitting a plateau). Pt states her A1C is 6.2 up from 5.8 (1 year ago). Pt states she wakes in the middle of the night often. Pt states her husband is concerned with her snoring, stating she still feels tired when she wakes up. Pt states she does not understand why her steps at work are not enough to lose weight. Pt states she works 2-10:30pm. Pt states she goes to bed around 12:30am waking around 10-11pm.   MEDICATIONS: See List   DIETARY INTAKE:  Usual eating pattern includes 3 meals and multiple snacks per day.  Everyday foods include none stated.  Avoided foods include none stated.    24-hr recall:  B ( 12pm AM): yogurt with fruit on the bottom with protein granola or 3 eggs Snk ( AM): twizzlers L ( 6 PM): frozen meal  Snk ( PM): chips and ramen noodles  D (11:15-11:30 PM): spaghetti or pizza or ribs  Snk ( PM): ice cream Beverages: water, V8 splash   Usual physical activity: ADL's  Estimated energy needs: 1500 calories 170 g carbohydrates 112 g protein 42 g fat  Progress Towards Goal(s):  In progress.   Nutritional Diagnosis:  NB-1.1 Food and nutrition-related knowledge deficit As related to lack of prior education by a nutrition professional.  As evidenced by increased A1C, pt lack of understanding of physical activity needs, and dietary recommendations.    Intervention:  Nutrition Counseling. Dietitian educated pt on difference between ADL and physical activity, educated pt on incorporating 3 meals and snacks in between to help control portion sizes, and educated pt regarding non-starchy vegetable meal ideas to include at lunch/dinner  Goals: -Talk to your doctor about getting a sleep study  -Have a snack at 3pm  -Is this hunger or appetite using your should I eat handout  -Have non  starchy vegetables with every dinner every day   Teaching Method Utilized:  Visual Auditory Hands on  Handouts given during visit include:  Should I Eat  Non-starchy vegetable meal ideas  Barriers to learning/adherence to lifestyle change: Work schedule, family support  Demonstrated degree of understanding via:  Teach Back   Monitoring/Evaluation:  Dietary intake, exercise,  and body weight in 2 week(s).

## 2018-11-21 ENCOUNTER — Other Ambulatory Visit: Payer: Self-pay | Admitting: Family Medicine

## 2018-11-21 MED FILL — metFORMIN HCL ER 500 MG TB2: 500 | 30 days supply | Qty: 30 | Fill #2 | Status: TO

## 2018-11-23 NOTE — Telephone Encounter (Signed)
Last office visit: 05/20/2018 for Biploar and mood disorders Last filled: 05/29/2018   In note from 05/20/2018 you stated that you were giving refills until patient got in with Psych.

## 2018-11-24 NOTE — Telephone Encounter (Signed)
No - she has established with psych, that's what she told me at our second visit and she knows that we do not manage bipolar disorder.   She has to call her psychiatrist to get those meds managed.

## 2018-11-24 NOTE — Telephone Encounter (Signed)
Left message return call

## 2018-11-24 NOTE — Telephone Encounter (Signed)
Spoke with patient and informed her that we are not managing her psych medications. Patient verbalized understanding.

## 2018-11-25 ENCOUNTER — Encounter: Payer: No Typology Code available for payment source | Admitting: Skilled Nursing Facility1

## 2018-11-25 ENCOUNTER — Other Ambulatory Visit: Payer: Self-pay

## 2018-11-25 DIAGNOSIS — E669 Obesity, unspecified: Secondary | ICD-10-CM | POA: Diagnosis not present

## 2018-11-25 DIAGNOSIS — Z6839 Body mass index (BMI) 39.0-39.9, adult: Principal | ICD-10-CM

## 2018-11-25 NOTE — Progress Notes (Signed)
  Assessment:  Primary concerns today: obesity.    Pt states she works 2-10:30pm. Pt states she goes to bed around 12:30am waking around 10-11pm.   Pt states these last few weeks have gone well using carrots an celery with ranch as a snack. Pt states she is trying to work on hunger verses appetite. Pt states she does not eat between 11 and 6 so she does not know if it is hunger or appetite. Pt state she has a consultation for a sleep study in May.   MEDICATIONS: See List   DIETARY INTAKE:  Usual eating pattern includes 3 meals and multiple snacks per day.  Everyday foods include none stated.  Avoided foods include none stated.    24-hr recall:  B ( 12pm AM): 2 eggs with 2 toast Snk ( AM): twizzlers or carrots and celery with ranch L ( 6 PM): frozen meal with popcorn or carrots or celery  Snk ( PM): chips and ramen noodles  D (11:15-11:30 PM): spaghetti or pork and beans or tomato soup with grilled cheese Snk ( PM): ice cream Beverages: 1.5 L water, V8 splash   Usual physical activity: ADL's  Estimated energy needs: 1500 calories 170 g carbohydrates 112 g protein 42 g fat  Progress Towards Goal(s):  In progress.   Nutritional Diagnosis:  NB-1.1 Food and nutrition-related knowledge deficit As related to lack of prior education by a nutrition professional.  As evidenced by increased A1C, pt lack of understanding of physical activity needs, and dietary recommendations.    Intervention:  Nutrition Counseling. Dietitian educated pt on difference between ADL and physical activity, educated pt on incorporating 3 meals and snacks in between to help control portion sizes, and educated pt regarding non-starchy vegetable meal ideas to include at lunch/dinner  Goals: -Have a snack at 3pm such as carrots and cheese  -Great job on drinking water!  -Choose more substantial snacks such as 1/4 cup nuts and 1 cutie -Stay around 100 calories for your snacks -Continue to identify the stress  eating and other ways to work through your stress not stuff it down   Teaching Method Utilized:  Visual Auditory Hands on  Handouts given during visit include:  Snack ideas   Barriers to learning/adherence to lifestyle change: Work schedule, family support  Demonstrated degree of understanding via:  Teach Back   Monitoring/Evaluation:  Dietary intake, exercise,  and body weight in 2 week(s).

## 2018-11-26 MED FILL — SUBVENITE 100 MG TABS: 100 | 30 days supply | Qty: 60 | Fill #0

## 2018-12-08 ENCOUNTER — Telehealth: Payer: Self-pay | Admitting: Neurology

## 2018-12-08 NOTE — Telephone Encounter (Signed)
Due to current COVID 19 pandemic, our office is severely reducing in office visits for at least the next 2 weeks, in order to minimize the risk to our patients and healthcare providers. Our staff will contact you for next steps.  Pt understands that although there may be some limitations with this type of visit, we will take all precautions to reduce any security or privacy concerns.  Pt understands that this will be treated like an in office visit and we will file with pt's insurance, and there may be a patient responsible charge related to this service. Pt's email is [email or web address]. Pt understands that the cis[employer] webex software must be downloaded and operational on the device pt plans to use for the visit. Pt understands that the nurse will be calling to go over pt's chart.

## 2018-12-08 NOTE — Telephone Encounter (Signed)
Due to current COVID 19 pandemic, our office is severely reducing in office visits for at least the next 2 weeks, in order to minimize the risk to our patients and healthcare providers. Our staff will contact you for next steps. I left a message for the pt to call me back and let me know if she wishes to push her appt out or do a telephone visit. Once pt calls back if she wishes to do a telephone visit I will go over the consent form with the pt.  ° °

## 2018-12-09 NOTE — Telephone Encounter (Signed)
I called pt to discuss her appt and update her chart. No answer, left a message asking her to call me back. 

## 2018-12-10 ENCOUNTER — Telehealth: Payer: Self-pay | Admitting: Neurology

## 2018-12-10 NOTE — Addendum Note (Signed)
Addended by: Geronimo Running A on: 12/10/2018 01:24 PM   Modules accepted: Orders

## 2018-12-10 NOTE — Telephone Encounter (Signed)
I called pt to discuss her appt and update her chart. No answer, left a message asking her to call me back.

## 2018-12-10 NOTE — Telephone Encounter (Signed)
I called pt. Pt's meds, allergies, and PMH were updated.  Pt has never had a sleep study but does endorse snoring.  Pt was instructed on how to measure her neck size prior to the appt next week.  Pt's recent weight is 228 lb and she is 5'1.5.  Pt understands that she needs to download the software prior to her appt next week.  Epworth Sleepiness Scale 0= would never doze 1= slight chance of dozing 2= moderate chance of dozing 3= high chance of dozing  Sitting and reading: 2 Watching TV: 2 Sitting inactive in a public place (ex. Theater or meeting): 3 As a passenger in a car for an hour without a break: 3 Lying down to rest in the afternoon: 2 Sitting and talking to someone: 1 Sitting quietly after lunch (no alcohol): 2 In a car, while stopped in traffic: 1 Total: 20  FSS: 56

## 2018-12-10 NOTE — Telephone Encounter (Signed)
Pt is returning your call please call her back thanks dg

## 2018-12-16 ENCOUNTER — Ambulatory Visit (INDEPENDENT_AMBULATORY_CARE_PROVIDER_SITE_OTHER): Payer: No Typology Code available for payment source | Admitting: Neurology

## 2018-12-16 ENCOUNTER — Encounter: Payer: Self-pay | Admitting: Neurology

## 2018-12-16 ENCOUNTER — Other Ambulatory Visit: Payer: Self-pay

## 2018-12-16 DIAGNOSIS — G4719 Other hypersomnia: Secondary | ICD-10-CM

## 2018-12-16 DIAGNOSIS — R0683 Snoring: Secondary | ICD-10-CM | POA: Diagnosis not present

## 2018-12-16 DIAGNOSIS — R0681 Apnea, not elsewhere classified: Secondary | ICD-10-CM

## 2018-12-16 DIAGNOSIS — Z6841 Body Mass Index (BMI) 40.0 and over, adult: Secondary | ICD-10-CM

## 2018-12-16 DIAGNOSIS — G478 Other sleep disorders: Secondary | ICD-10-CM

## 2018-12-16 NOTE — Progress Notes (Signed)
Huston Foley, MD, PhD [facility] [address] [address] [place], [place] [address]   Virtual Visit via Video Note on 12/16/2018:  I connected with Kaylee Hill on 12/16/18 at  9:30 AM EDT by a video enabled telemedicine application and verified that I am speaking with the correct person using two identifiers.   I discussed the limitations of evaluation and management by telemedicine and the availability of in person appointments. The patient expressed understanding and agreed to proceed.  History of Present Illness: Kaylee Hill is a 37 year old right-handed woman with an underlying medical history of hypertension, depression, anxiety, sickle cell trait, hyperlipidemia, iron deficiency, vitamin D deficiency, history of gestational diabetes and orbit obesity with a BMI of over 40, with whom I am conducting a virtual, video based new patient visit via Webex in lieu of a face-to-face visit for evaluation of her sleep disorder, in particular, concern for underlying obstructive sleep apnea. The patient is unaccompanied today and joins via cell phone from home. She is referred by her primary care PA, Henry Russel, and I reviewed her note from 09/25/2018. Patient endorses snoring, witnessed apneas and excessive daytime somnolence. Her Epworth sleepiness score is 20 out of 24, fatigue severity score is 56 out of 63.  Note, she is on potentially sedating medications including Abilify, lamotrigine and a beta blocker, labetalol.she has been on Abilify for the past 5+ years, on Lamictal only for the past few months. 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 Bedtime is around midnight and rise time around 10. Her husband and her mother-in-law have witnessed breathing pauses while she is asleep. She has had infrequent episodes of sleep paralysis, happens maybe once every 3 months, has noticed this for the past few years, maybe  as long as 7 years. Her snoring wakes her up at times and sometimes she has woken up with a sense of gasping for air. She does not have recurrent morning headaches or night to night nocturia. She's not aware of any family history of OSA. She's not had a tonsillectomy. She has 4 dogs in the household, to our her mother-in-law's and 2 are affairs, they sleep on the floor in her bedroom. She lives with her husband, 47-year-old daughter, mother-in-law and sister-in-law. Her daughter sleeps in her own bed in their bedroom. She has a TV on at night for white noise. She does not drink caffeine on a daily basis secondary to feeling jittery from it. She does not utilize any alcohol currently and is a nonsmoker, she smoked one year between 1999 and 2000. Her mood is stable currently on her medication regimen, she is followed by mental health.    Her Past Medical History Is Significant For: Past Medical History:  Diagnosis Date  . Anxiety   . Bipolar 1 disorder (HCC)   . Depression   . Gestational diabetes   . Hypertension     Her Past Surgical History Is Significant For: Past Surgical History:  Procedure Laterality Date  . BREAST SURGERY      Her Family History Is Significant For: Family History  Problem Relation Age of Onset  . Bipolar disorder Mother   . Diabetes Father   . Hypertension Father   . Schizophrenia Father   . Asthma Brother   . Diabetes Maternal Grandmother   . Cancer Maternal Grandmother        breast, pancreatic cancer  . Dementia  Maternal Grandfather     Her Social History Is Significant For: Social History   Socioeconomic History  . Marital status: Married    Spouse name: Not on file  . Number of children: 1  . Years of education: Not on file  . Highest education level: Some college, no degree  Occupational History  . Occupation: Chief Executive Officer: [employer]  Social Needs  . Financial resource strain: Not hard at all  . Food insecurity:    Worry:  Never true    Inability: Never true  . Transportation needs:    Medical: No    Non-medical: No  Tobacco Use  . Smoking status: Former Smoker    Types: Cigarettes  . Smokeless tobacco: Never Used  Substance and Sexual Activity  . Alcohol use: No    Comment: occasionally  . Drug use: No  . Sexual activity: Yes    Birth control/protection: Implant  Lifestyle  . Physical activity:    Days per week: 0 days    Minutes per session: 0 min  . Stress: To some extent  Relationships  . Social connections:    Talks on phone: More than three times a week    Gets together: Once a week    Attends religious service: Never    Active member of club or organization: No    Attends meetings of clubs or organizations: Never    Relationship status: Married  Other Topics Concern  . Not on file  Social History Narrative  . Not on file    Her Allergies Are:  No Known Allergies:   Her Current Medications Are:  Outpatient Encounter Medications as of 12/16/2018  Medication Sig  . ARIPiprazole (ABILIFY) 15 MG tablet Take 15 mg by mouth daily.   . hydrocortisone (ANUSOL-HC) 25 MG suppository Place 1 suppository (25 mg total) rectally 2 (two) times daily as needed for hemorrhoids or anal itching.  . IRON PO Take 325 mg by mouth daily.  . Iron-FA-B Cmp-C-Biot-Probiotic (FUSION PLUS) CAPS Take 1 capsule by mouth at bedtime.  Marland Kitchen labetalol (NORMODYNE) 200 MG tablet Take 1 tablet (200 mg total) by mouth 2 (two) times daily.  Marland Kitchen lamoTRIgine 100 MG TBDP Take 2 tablets (200 mg total) by mouth at bedtime.  . Melatonin 5 MG TABS Take by mouth as needed.   . metFORMIN (GLUCOPHAGE-XR) 500 MG 24 hr tablet TAKE 1 TABLET BY MOUTH DAILY WITH BREAKFAST.  Marland Kitchen polyethylene glycol powder (GLYCOLAX/MIRALAX) powder Take 17 g by mouth daily.   No facility-administered encounter medications on file as of 12/16/2018.   :   Review of Systems:  Out of a complete 14 point review of systems, all are reviewed and negative with  the exception of these symptoms as listed below:  Observations/Objective: Her most recent vital signs on file for my review are from 11/05/2018: Blood pressure 133/94, pulse 81, weight 229 for a BMI of 43.27. Her recent weight by self report is 231 pounds yesterday, neck circumference by self-report is 15-1/2 inches.  On examination, she is in no acute distress, pleasant, conversant, face symmetric, speech clear, without dysarthria, hypophonia or voice tremor noted. Airway examination reveals Mallampati class III, she has a thicker soft palate and larger appearing tongue. Tonsils not fully visualized and uvula not fully visualized. Hearing is grossly intact, extra ocular movements are preserved, she is wearing corrective eyeglasses.glasses. Upper body movements are grossly normal, upper extremity coordination grossly intact. She walks without problems.  Assessment and  Plan: Kaylee Hill is a 38 year old right-handed woman with an underlying medical history of hypertension, depression, anxiety, sickle cell trait, hyperlipidemia, iron deficiency, vitamin D deficiency, history of gestational diabetes and orbit obesity with a BMI of over 40, with whom I am conducting a virtual, video based new patient visit via Webex in lieu of a face-to-face visit for evaluation of an underlying organic sleep disorder, in particular, concern for obstructive sleep apnea. She is advised that sleep paralysis episodes can happen physiologically in some patients and sometimes are correlated with underlying sleep disordered breathing. The patient's medical history and physical exam (albeit limited with current video-based evaluation) are concerning for a diagnosis of obstructive sleep apnea. I discussed with the patient the diagnosis of OSA, its prognosis and treatment options. I explained in particular the risks and ramifications of untreated moderate to severe OSA, especially with respect to developing cardiovascular disease  down the Road, including congestive heart failure, difficult to treat hypertension, cardiac arrhythmias, or stroke. Even type 2 diabetes has, in part, been linked to untreated OSA. Symptoms of untreated OSA may include daytime sleepiness, memory problems, mood irritability and mood disorder such as depression and anxiety, lack of energy, as well as recurrent headaches, especially morning headaches. We talked about the importance of weight control. We talked about the importance of maintaining good sleep hygiene. I recommended the following at this time: home sleep test.  I explained the sleep test procedure to the patient and also outlined possible treatment options of OSA, including the use of a custom-made dental device (which would require a referral to a specialist dentist), upper airway surgical options, (such as UPPP, which would involve a referral to an ENT). I also explained the CPAP vs. AutoPAP treatment option to the patient, who indicated that she would be willing to try CPAP if the need arises. I answered all her questions today and the patient was in agreement. I plan to see the patient back after the sleep study is completed and encouraged her to call with any interim questions, concerns, problems or updates.   Huston FoleySaima Chas Axel, MD, PhD  Follow Up Instructions: 1. HST, sleep lab staff will reach out to patient to arrange for sending the unit to home address or "drive by pickup" and for tutorial, making sure patient is comfortable with the unit and usage.  2. Consider AutoPap therapy, if home sleep test positive for obstructive sleep apnea, patient agreeable. 3. We talked about alternative treatment options and current limitations.  4. Follow-up after starting AutoPap therapy, follow-up to be scheduled according to set-up date. 5. Pursue healthy lifestyle, good sleep hygiene, healthy weight. 6. Call for any interim questions or concerns.  I discussed the assessment and treatment plan with the  patient. The patient was provided an opportunity to ask questions and all were answered. The patient agreed with the plan and demonstrated an understanding of the instructions.   The patient was advised to call back or seek an in-person evaluation if the symptoms worsen or if the condition fails to improve as anticipated.  I provided 30 minutes of non-face-to-face time during this encounter.   Huston FoleySaima Adynn Caseres, MD

## 2018-12-16 NOTE — Patient Instructions (Signed)
Given verbally, during today's virtual video-based encounter, with verbal feedback received.   

## 2018-12-30 ENCOUNTER — Telehealth: Payer: Self-pay | Admitting: Neurology

## 2018-12-30 MED FILL — FUSION PLUS CAPSULE: 30 days supply | Qty: 30 | Fill #0

## 2018-12-30 MED FILL — SUBVENITE 100 MG TABS: 100 | 30 days supply | Qty: 60 | Fill #1

## 2018-12-30 MED FILL — LABETALOL HCL 200 MG TABS: 200 | 30 days supply | Qty: 60 | Fill #0

## 2018-12-30 MED FILL — metFORMIN HCL ER 500 MG TB2: 500 | 30 days supply | Qty: 30 | Fill #0

## 2018-12-30 MED FILL — ARIPIPRAZOLE 15 MG TABS: 15 | 30 days supply | Qty: 30 | Fill #0

## 2018-12-30 NOTE — Telephone Encounter (Signed)
Patient would like a call back about the status on the approval with her insurance about the machine for the sleep study.

## 2019-01-04 ENCOUNTER — Institutional Professional Consult (permissible substitution): Payer: No Typology Code available for payment source | Admitting: Neurology

## 2019-01-06 ENCOUNTER — Ambulatory Visit: Payer: Self-pay | Admitting: Skilled Nursing Facility1

## 2019-01-11 ENCOUNTER — Other Ambulatory Visit: Payer: Self-pay

## 2019-01-11 ENCOUNTER — Ambulatory Visit (INDEPENDENT_AMBULATORY_CARE_PROVIDER_SITE_OTHER): Payer: No Typology Code available for payment source | Admitting: Neurology

## 2019-01-11 DIAGNOSIS — G478 Other sleep disorders: Secondary | ICD-10-CM

## 2019-01-11 DIAGNOSIS — R0683 Snoring: Secondary | ICD-10-CM

## 2019-01-11 DIAGNOSIS — G4733 Obstructive sleep apnea (adult) (pediatric): Secondary | ICD-10-CM | POA: Diagnosis not present

## 2019-01-11 DIAGNOSIS — G4719 Other hypersomnia: Secondary | ICD-10-CM

## 2019-01-11 DIAGNOSIS — R0681 Apnea, not elsewhere classified: Secondary | ICD-10-CM

## 2019-01-14 NOTE — Addendum Note (Signed)
Addended by: Huston Foley on: 01/14/2019 05:20 PM   Modules accepted: Orders

## 2019-01-14 NOTE — Progress Notes (Signed)
Patient referred by her PCP, seen virtually by me on 12/16/18, HST on 01/12/19.    Please call and notify the patient that the recent home sleep test showed obstructive sleep apnea in the severe range. While I recommend treatment for this in the form CPAP, we are not yet bringing patients in for in-lab testing for CPAP titration studies, due to the virus pandemic; therefore, I suggest we start her on a trial of autoPAP at home, which means, that we don't have to bring her in for a sleep study with CPAP, but will let her start using an autoPAP machine at home, through a DME company (of patient's choice, or as per insurance requirement, as per in [employer], if there are such restrictions, depending on insurance carrier). The DME representative will educate the patient on how to use the machine, how to put the mask on, etc. I have placed an order in the chart. Please send referral, talk to patient, send report to referring MD. We will need a FU in sleep clinic for 10 weeks post-PAP set up, please arrange that with me or one of our NPs.  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 For concerns and questions about how to clean the PAP machine and the supplies and how frequently to change the hose, mask and filters, etc., patient can call the DME company, for more information, education and troubleshooting. Especially in the current situation, I recommend, patients be extra mindful about hand hygiene, handling the PAP equipment only with clean hands, wipe the mask daily, keep little one and four-legged companions (and any other pets for that matter) away from the machine and mask at all times.    Thanks,   Huston Foley, MD, PhD [facility]  [facility])

## 2019-01-14 NOTE — Procedures (Signed)
Patient Information     First Name: Kaylee Last Name: Hill ID: [id number]  Birth Date: November 29, 1980 Age: 38 Gender: Female  Referring Provider: Danelle Berry, PA-C BMI: 43.3 (W=229 lb, H=5' 1'')  Neck Circ.:  15 '' Epworth:  20/24   Sleep Study Information    Study Date: Jan 12, 2019 S/H/A Version: [phone] / 4.1.1528 / 64   History: Ms. Kaylee Hill is a 38 year old right-handed woman with an underlying medical history of hypertension, depression, anxiety, sickle cell trait, hyperlipidemia, iron deficiency, vitamin D deficiency, history of gestational diabetes and orbit obesity with a BMI of over 40, who reports snoring, witnessed apneas and excessive daytime sleepiness. Summary & Diagnosis:    OSA Recommendations:     This home sleep test demonstrates severe obstructive sleep apnea with a total AHI of 64.3/hour and O2 nadir of 81%. Treatment with positive airway pressure (PAP) - in the form of CPAP - is recommended. This will require, ideally, a full night CPAP titration study for proper treatment settings, O2 monitoring and mask fitting. Based on the severity of the sleep disordered breathing, an attended titration study is indicated. However, under the current circumstances (i.e. the COVID-19 pandemic), in order to ensure continuity of care and for the safety of the patient and healthcare professionals, she will be advised to proceed with an autoPAP titration/trial at home. A proper overnight, lab-attended PAP titration study with CPAP may be helpful or needed down the road to optimize treatment, when considered safe. Please note, that untreated obstructive sleep apnea may carry additional perioperative morbidity. Patients with significant obstructive sleep apnea should receive perioperative PAP therapy and the surgeons and particularly the anesthesiologist should be informed of the diagnosis and the severity of the sleep disordered breathing. Patient will be reminded regarding compliance with  the PAP machine and to be mindful of cleanliness with the equipment and timely with supply changes (i.e. changing filter, mask, hose, humidifier chamber on an ongoing basis, as recommended, and cleaning parts that touch the face and nose daily, etc). The patient should be cautioned not to drive, work at heights, or operate dangerous or heavy equipment when tired or sleepy. Review and reiteration of good sleep hygiene measures should be pursued with any patient. Other causes of the patient's symptoms, including circadian rhythm disturbances, an underlying mood disorder, medication effect and/or an underlying medical problem cannot be ruled out based on this test. Clinical correlation is recommended. The patient and her referring provider will be notified of the test results. The patient will be seen in follow up in sleep clinic at [facility], either for a face-to-face or virtual visit, whichever feasible and recommended at the time.  I certify that I have reviewed the raw data recording prior to the issuance of this report in accordance with the standards of the American Academy of Sleep Medicine (AASM).  Huston Foley, MD, PhD [facility]) Diplomat, ABPN (Neurology and Sleep)         Sleep Summary  Oxygen Saturation Statistics   Start Study Time: End Study Time: Total Recording Time:   1:11:15 AM   9:40:47 AM        8 hrs, 29 min  Total Sleep Time % REM of Sleep Time:  7 hrs, 43 min 27.7    Mean: 95 Minimum: 81 Maximum: 100  Mean of Desaturations Nadirs (%):   91  Oxygen Desaturation %:  4-9 10-20 >20 Total  Events Number Total   298 76.4  92  0 23.6 0.0  390 100.0  Oxygen Saturation: <90 <=88  <85 <80 <70  Duration (minutes): Sleep % 15.7 3.4 10.9 2.3  1.1 0.0  0.2 0.0 0.0 0.0     Respiratory Indices      Total Events REM NREM All Night  pRDI:  486  pAHI:  486 ODI:  390  pAHIc:  51  % CSR: 10.5 83.4 83.4 77.1 15.0 57.1 57.1 42.0 3.6  64.3 64.3 51.6 6.7       Pulse Rate Statistics during Sleep (BPM)      Mean:  80 Minimum: 40 Maximum: 105    Indices are calculated using technically valid sleep time of  7 hrs, 33 min. pRDI/pAHI are calculated using oxi desaturations ? 3%  Body Position Statistics  Position Supine Prone Right Left Non-Supine  Sleep (min) 380.0 80.5 0.0 3.0 83.5  Sleep % 82.0 17.4 0.0 0.6 18.0  pRDI 60.9 79.3 N/A N/A 79.5  pAHI 60.9 79.3 N/A N/A 79.5  ODI 47.5 70.2 N/A N/A 69.9     Snoring Statistics Snoring Level (dB) >40 >50 >60 >70 >80 >Threshold (45)  Sleep (min) 405.9 46.7 3.0 0.0 0.0 140.4  Sleep % 87.6 10.1 0.6 0.0 0.0 30.3    Mean: 44 dB Sleep Stages Chart                                                                             pAHI=64.2                                                                                               Mild              Moderate                    Severe                                                    5              15                    30

## 2019-01-18 ENCOUNTER — Telehealth: Payer: Self-pay

## 2019-01-18 DIAGNOSIS — G4733 Obstructive sleep apnea (adult) (pediatric): Secondary | ICD-10-CM

## 2019-01-18 NOTE — Telephone Encounter (Signed)
-----   Message from Huston Foley, MD sent at 01/14/2019  5:20 PM EDT ----- Patient referred by her PCP, seen virtually by me on 12/16/18, HST on 01/12/19.    Please call and notify the patient that the recent home sleep test showed obstructive sleep apnea in the severe range. While I recommend treatment for this in the form CPAP, we are not yet bringing patients in for in-lab testing for CPAP titration studies, due to the virus pandemic; therefore, I suggest we start her on a trial of autoPAP at home, which means, that we don't have to bring her in for a sleep study with CPAP, but will let her start using an autoPAP machine at home, through a DME company (of patient's choice, or as per insurance requirement, as per in [employer], if there are such restrictions, depending on insurance carrier). The DME representative will educate the patient on how to use the machine, how to put the mask on, etc. I have placed an order in the chart. Please send referral, talk to patient, send report to referring MD. We will need a FU in sleep clinic for 10 weeks post-PAP set up, please arrange that with me or one of our NPs.  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 For concerns and questions about how to clean the PAP machine and the supplies and how frequently to change the hose, mask and filters, etc., patient can call the DME company, for more information, education and troubleshooting. Especially in the current situation, I recommend, patients be extra mindful about hand hygiene, handling the PAP equipment only with clean hands, wipe the mask daily, keep little one and four-legged companions (and any other pets for that matter) away from the machine and mask at all times.      Thanks,   Huston Foley, MD, PhD [facility]

## 2019-01-18 NOTE — Telephone Encounter (Signed)
I called pt. I advised pt that Dr. Frances Furbish reviewed their sleep study results and found that pt has severe osa. Dr. Frances Furbish recommends that pt start an auto pap at home. I reviewed PAP compliance expectations with the pt. Pt is agreeable to starting an auto-PAP. I advised pt that an order will be sent to a DME, [facility], and [facility] will call the pt within about one week after they file with the pt's insurance. [facility] will show the pt how to use the machine, fit for masks, and troubleshoot the auto-PAP if needed. A follow up appt was made for insurance purposes with Dr. Frances Furbish on 04/05/2019 at 9:30am. Pt verbalized understanding to arrive 15 minutes early and bring their auto-PAP. A letter with all of this information in it will be mailed to the pt as a reminder. I verified with the pt that the address we have on file is correct. Pt verbalized understanding of results. Pt had no questions at this time but was encouraged to call back if questions arise. I have sent the order to [facility] and have received confirmation that they have received the order.

## 2019-01-19 NOTE — Addendum Note (Signed)
Addended by: Geronimo Running A on: 01/19/2019 01:53 PM   Modules accepted: Orders

## 2019-02-20 ENCOUNTER — Ambulatory Visit (INDEPENDENT_AMBULATORY_CARE_PROVIDER_SITE_OTHER)
Admission: RE | Admit: 2019-02-20 | Discharge: 2019-02-20 | Disposition: A | Discharge status: Home / Self Care | Payer: No Typology Code available for payment source | Source: Ambulatory Visit

## 2019-02-20 DIAGNOSIS — M7918 Myalgia, other site: Secondary | ICD-10-CM

## 2019-02-20 DIAGNOSIS — M25511 Pain in right shoulder: Secondary | ICD-10-CM | POA: Diagnosis not present

## 2019-02-20 NOTE — ED Provider Notes (Signed)
Virtual Visit via Video Note:  Kaylee Hill  initiated request for Telemedicine visit with [facility] team. I connected with Kaylee Hill  on 02/21/2019 at 6:55 PM  for a synchronized telemedicine visit using a video enabled HIPPA compliant telemedicine application. I verified that I am speaking with Kaylee Hill  using two identifiers. [place] Hall-Potvin, PA-C  was physically located in a [facility] site and Tamicka Shimon was located at a different location.   The limitations of evaluation and management by telemedicine as well as the availability of in-person appointments were discussed. Patient was informed that she  may incur a bill (including co-pay) for this virtual visit encounter. Kaylee Hill  expressed understanding and gave verbal consent to proceed with virtual visit.     History of Present Illness:Kaylee Hill  is a 38 y.o. female with history of anxiety, bipolar 1 disorder, depression, hypertension, low back pain presents with acute concern of right shoulder pain and left sided low back pain.  Patient states that she slept weird on her right arm 3 to 5 days ago.  States that she woke up with it numb, that this is gone away since.  Patient still endorsing residual shoulder tenderness that is worse with overhead and crossarm movement.  Patient denies dropping objects, upper extremity paresthesias or numbness.  States that the pain is nonradiating.  Patient has applied lidocaine patches with adequate relief of pain patient denies fall, trauma, wound or rash.  Patient states that sometimes when she turns her neck all the way to the left she will feel tightness on the right side of her neck that extends into her shoulder as well. Patient states that she has been having increased left-sided low back pain that extends to her thigh and sometimes down to the front of her knee.  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  Patient denies abnormal  gait, foot drop, fall, lower extremity paresthesias or numbness, saddle area anesthesia, urinary or fecal incontinence.  Patient states her pain is dull/achy.  Has also use lidocaine patches for this area of concern which helps as well.  Patient states that prolonged standing exacerbates her pain.  States that massage, lidocaine patch, ibuprofen helps.  Past Medical History:  Diagnosis Date  . Anxiety   . Bipolar 1 disorder ([place])   . Depression   . Gestational diabetes   . Hypertension     No Known Allergies      Observations/Objective: 38 year old female sitting in no acute distress.  No obvious bruising, rash in the areas of concern.  Assessment and Plan: 38 year old female with history of chronic low back pain presenting for left-sided back pain with radiation down to front of knee and right shoulder pain after sleeping on it 3 days ago.  History is reassuring: Likely musculoskeletal process.  Will continue lidocaine patches and OTC NSAIDs for continued relief.  Encourage patient to stretch, increase water intake.  Follow Up Instructions: Spent time discussing limitations of virtual visits for musculoskeletal complaints.  Patient feels comfortable with current treatment, states that she will follow-up with her PCP if no improvement or present to this clinic if needed for in-person evaluation.  Verbalized understanding of return precautions.   I discussed the assessment and treatment plan with the patient. The patient was provided an opportunity to ask questions and all were answered. The patient agreed with the plan and demonstrated an understanding of the instructions.   The patient  was advised to call back or seek an in-person evaluation if the symptoms worsen or if the condition fails to improve as anticipated.  I provided 25 minutes of non-face-to-face time during this encounter.    [place] Hall-Potvin, PA-C  02/21/2019 6:55 PM        Hall-Potvin, [place], [place] JerseyPA-C 02/21/19  [id number]

## 2019-02-22 ENCOUNTER — Encounter (HOSPITAL_COMMUNITY): Payer: Self-pay | Admitting: Family Medicine

## 2019-02-22 ENCOUNTER — Other Ambulatory Visit: Payer: Self-pay

## 2019-02-22 ENCOUNTER — Emergency Department (HOSPITAL_COMMUNITY)
Admission: EM | Admit: 2019-02-22 | Discharge: 2019-02-23 | Disposition: A | Discharge status: Home / Self Care | Payer: No Typology Code available for payment source | Attending: Emergency Medicine | Admitting: Emergency Medicine

## 2019-02-22 DIAGNOSIS — Z5321 Procedure and treatment not carried out due to patient leaving prior to being seen by health care provider: Secondary | ICD-10-CM | POA: Diagnosis not present

## 2019-02-22 DIAGNOSIS — M79641 Pain in right hand: Secondary | ICD-10-CM | POA: Diagnosis present

## 2019-02-22 NOTE — ED Triage Notes (Signed)
Patient is complaining of right hand, mostly around the thumb, pain that started today. Patient states 2 days ago she did fall on the couch and attempted to brace herself with hands. Patient denies any pain from that fall to her left hand. Also, states her grip ability has decreased since the pain started.

## 2019-02-23 ENCOUNTER — Encounter: Payer: Self-pay | Admitting: Family Medicine

## 2019-02-23 ENCOUNTER — Emergency Department (HOSPITAL_COMMUNITY): Payer: No Typology Code available for payment source

## 2019-02-23 NOTE — ED Notes (Signed)
Registration informed myself that patient left AMA.

## 2019-02-24 ENCOUNTER — Ambulatory Visit: Payer: No Typology Code available for payment source | Admitting: Family Medicine

## 2019-02-24 ENCOUNTER — Ambulatory Visit (INDEPENDENT_AMBULATORY_CARE_PROVIDER_SITE_OTHER): Payer: No Typology Code available for payment source | Admitting: Family Medicine

## 2019-02-24 ENCOUNTER — Encounter: Payer: Self-pay | Admitting: Family Medicine

## 2019-02-24 ENCOUNTER — Other Ambulatory Visit: Payer: Self-pay

## 2019-02-24 VITALS — BP 132/70 | HR 73 | Temp 98.2°F | Resp 18 | Ht 62.0 in | Wt 238.0 lb

## 2019-02-24 DIAGNOSIS — M779 Enthesopathy, unspecified: Secondary | ICD-10-CM | POA: Diagnosis not present

## 2019-02-24 DIAGNOSIS — M778 Other enthesopathies, not elsewhere classified: Secondary | ICD-10-CM

## 2019-02-24 DIAGNOSIS — M25531 Pain in right wrist: Secondary | ICD-10-CM

## 2019-02-24 DIAGNOSIS — F319 Bipolar disorder, unspecified: Secondary | ICD-10-CM | POA: Diagnosis not present

## 2019-02-24 DIAGNOSIS — D573 Sickle-cell trait: Secondary | ICD-10-CM | POA: Diagnosis not present

## 2019-02-24 DIAGNOSIS — E119 Type 2 diabetes mellitus without complications: Secondary | ICD-10-CM

## 2019-02-24 NOTE — Patient Instructions (Addendum)
Follow up in 2 weeks if not getting better   Apply Ice, immobilize and continue to take aleve  Thumb spica brace to help immobilize the thumb  Get Xray done  Wrist Sprain, Adult A wrist sprain is a stretch or tear in the strong, fibrous tissues (ligaments) that connect your wrist bones. There are three types of wrist sprains:  Grade 1. In this type of sprain, the ligament is stretched more than normal.  Grade 2. In this type of sprain, the ligament is partially torn. You may be able to move your wrist, but not very much.  Grade 3. In this type of sprain, the ligament or muscle is completely torn. You may find it difficult or extremely painful to move your wrist even a little. What are the causes? A wrist sprain can be caused by using the wrist too much during sports, exercise, or at work. It can also happen with a fall or during an accident. What increases the risk? This condition is more likely to occur in people:  With a previous wrist or arm injury.  With poor wrist strength and flexibility.  Who play contact sports, such as football or soccer.  Who play sports that may result in a fall, such as skateboarding, biking, skiing, or snowboarding.  Who do not exercise regularly.  Who use exercise equipment that does not fit well. What are the signs or symptoms? Symptoms of this condition include:  Pain in the wrist, arm, or hand.  Swelling or bruised skin near the wrist, hand, or arm. The skin may look yellow or kind of blue.  Stiffness or trouble moving the hand.  Hearing a pop or feeling a tear at the time of the injury.  A warm feeling in the skin around the wrist. How is this diagnosed? This condition is diagnosed with a physical exam. Sometimes an X-ray is taken to make sure a bone did not break. If your health care provider thinks that you tore a ligament, he or she may order an MRI of your wrist. How is this treated? This condition is treated by resting and  applying ice to your wrist. Additional treatment may include:  Medicine for pain and inflammation.  A splint to keep your wrist still (immobilized).  Exercises to strengthen and stretch your wrist.  Surgery. This may be done if the ligament is completely torn. Follow these instructions at home: If you have a splint:   Do not put pressure on any part of the splint until it is fully hardened. This may take several hours.  Wear the splint as told by your health care provider. Remove it only as told by your health care provider.  Loosen the splint if your fingers tingle, become numb, or turn cold and blue.  If your splint is not waterproof: ? Do not let it get wet. ? Cover it with a watertight covering when you take a bath or a shower.  Keep the splint clean. Managing pain, stiffness, and swelling   If directed, put ice on the injured area. ? If you have a removable splint, remove it as told by your health care provider. ? Put ice in a plastic bag. ? Place a towel between your skin and the bag or between the splint and the bag. ? Leave the ice on for 20 minutes, 2-3 times per day.  Move your fingers often to avoid stiffness and to lessen swelling.  Raise (elevate) the injured area above the level of your  heart while you are sitting or lying down. Activity  Rest your wrist. Do not do things that cause pain.  Return to your normal activities as told by your health care provider. Ask your health care provider what activities are safe for you.  Do exercises as told by your health care provider. General instructions  Take over-the-counter and prescription medicines only as told by your health care provider.  Do not use any products that contain nicotine or tobacco, such as cigarettes and e-cigarettes. These can delay healing. If you need help quitting, ask your health care provider.  Ask your health care provider when it is safe to drive if you have a splint.  Keep all  follow-up visits as told by your health care provider. This is important. Contact a health care provider if:  Your pain, bruising, or swelling gets worse.  Your skin becomes red, gets a rash, or has open sores.  Your pain does not get better or it gets worse. Get help right away if:  You have a new or sudden sharp pain in the hand, arm, or wrist.  You have tingling or numbness in your hand.  Your fingers turn white, very red, or cold and blue.  You cannot move your fingers. This information is not intended to replace advice given to you by your health care provider. Make sure you discuss any questions you have with your health care provider. Document Released: 04/22/2014 Document Revised: 03/16/2016 Document Reviewed: 03/07/2016 Elsevier Interactive Patient Education  2019 Elsevier Inc.   RICE Therapy for Routine Care of Injuries Many injuries can be cared for with rest, ice, compression, and elevation (RICE therapy). This includes:  Resting the injured part.  Putting ice on the injury.  Putting pressure (compression) on the injury.  Raising the injured part (elevation). Using RICE therapy can help to lessen pain and swelling. Supplies needed:  Ice.  Plastic bag.  Towel.  Elastic bandage.  Pillow or pillows to raise (elevate) your injured body part. How to care for your injury with RICE therapy Rest Limit your normal activities, and try not to use the injured part of your body. You can go back to your normal activities when your doctor says it is okay to do them and you feel okay. Ask your doctor if you should do exercises to help your injury get better. Ice Put ice on the injured area. Do not put ice on your bare skin.  Put ice in a plastic bag.  Place a towel between your skin and the bag.  Leave the ice on for 20 minutes, 2-3 times a day. Use ice on as many days as told by your doctor.  Compression Compression means putting pressure on the injured area.  This can be done with an elastic bandage. If an elastic bandage has been put on your injury:  Do not wrap the bandage too tight. Wrap the bandage more loosely if part of your body away from the bandage is blue, swollen, cold, painful, or loses feeling (gets numb).  Take off the bandage and put it on again. Do this every 3-4 hours or as told by your doctor.  See your doctor if the bandage seems to make your problems worse.  Elevation Elevation means keeping the injured area raised. If you can, raise the injured area above your heart or the center of your chest. Contact a doctor if:  You keep having pain and swelling.  Your symptoms get worse. Get help right away  if:  You have sudden bad pain at your injury or lower than your injury.  You have redness or more swelling around your injury.  You have tingling or numbness at your injury or lower than your injury, and it does not go away when you take off the bandage. Summary  Many injuries can be cared for using rest, ice, compression, and elevation (RICE therapy).  You can go back to your normal activities when you feel okay and your doctor says it is okay.  Put ice on the injured area as told by your doctor.  Get help if your symptoms get worse or if you keep having pain and swelling. This information is not intended to replace advice given to you by your health care provider. Make sure you discuss any questions you have with your health care provider. Document Released: 02/05/2008 Document Revised: 05/09/2017 Document Reviewed: 05/09/2017 Elsevier Interactive Patient Education  2019 [employer]

## 2019-02-24 NOTE — Progress Notes (Signed)
Patient ID: Kaylee Hill, female    DOB: 1980-11-10, 38 y.o.   MRN: [id number]  PCP: Delsa Grana, PA-C  Chief Complaint  Patient presents with  . Hand Pain    R hand, started 06/22, aleve was taken    Subjective:   Kaylee Hill is a 38 y.o. female, presents to clinic with CC of right wrist and thumb pain x 2 day.  Right hand dominant Pain had gradual onset, constant, improving since it started, currently pain severity 2/10 right now, described as achy, stays over radial wrist and thumb w/o radiation.  There was some associated swelling and felt like it was popping and it was very painful to grip anything, but that has since resolved. She had a mild tumble onto a couch a few days before, fell onto hip and did catch herself with both arms out, although no pain at all then.  She denies any redness, any repetitive hand arm or wrist motions.  No numbness or tingling.    Patient Active Problem List   Diagnosis Date Noted  . Vitamin D deficiency 08/12/2018  . Iron deficiency anemia, unspecified 08/07/2018  . Class 2 obesity with body mass index (BMI) of 39.0 to 39.9 in adult 05/20/2018  . Type 2 diabetes mellitus ([place]) 05/01/2017  . Bipolar disorder with psychotic features ([place]) 04/28/2017  . Sickle cell trait ([place]) 12/09/2014  . Benign essential hypertension 04/09/2013  . Familial multiple lipoprotein-type hyperlipidemia 04/09/2013     Prior to Admission medications   Medication Sig Start Date End Date Taking? Authorizing Provider  ARIPiprazole (ABILIFY) 15 MG tablet Take 15 mg by mouth daily.  07/24/18  Yes [provider]  hydrocortisone (ANUSOL-HC) 25 MG suppository Place 1 suppository (25 mg total) rectally 2 (two) times daily as needed for hemorrhoids or anal itching. 09/10/18  Yes Delsa Grana, PA-C  IRON PO Take 325 mg by mouth daily.   Yes [provider]  Iron-FA-B Cmp-C-Biot-Probiotic (FUSION PLUS) CAPS Take 1 capsule by mouth at bedtime. 11/11/18  Yes  Delsa Grana, PA-C  labetalol (NORMODYNE) 200 MG tablet Take 1 tablet (200 mg total) by mouth 2 (two) times daily. 08/31/18  Yes Roma Schanz, CNM  lamoTRIgine 100 MG TBDP Take 2 tablets (200 mg total) by mouth at bedtime. 05/29/18  Yes Delsa Grana, PA-C  Melatonin 5 MG TABS Take by mouth as needed.    Yes [provider]  metFORMIN (GLUCOPHAGE-XR) 500 MG 24 hr tablet TAKE 1 TABLET BY MOUTH DAILY WITH BREAKFAST. 08/19/18  Yes Delsa Grana, PA-C  polyethylene glycol powder (GLYCOLAX/MIRALAX) powder Take 17 g by mouth daily. 09/10/18  Yes Delsa Grana, PA-C     No Known Allergies   Family History  Problem Relation Age of Onset  . Bipolar disorder Mother   . Diabetes Father   . Hypertension Father   . Schizophrenia Father   . Asthma Brother   . Diabetes Maternal Grandmother   . Cancer Maternal Grandmother        breast, pancreatic cancer  . Dementia Maternal Grandfather      Social History   Socioeconomic History  . Marital status: Married    Spouse name: Not on file  . Number of children: 1  . Years of education: Not on file  . Highest education level: Some college, no degree  Occupational History  . Occupation: Marine scientist: [employer]  . Financial resource strain: Not hard at all  .  Food insecurity    Worry: Never true    Inability: Never true  . Transportation needs    Medical: No    Non-medical: No  Tobacco Use  . Smoking status: Former Smoker    Types: Cigarettes  . Smokeless tobacco: Never Used  Substance and Sexual Activity  . Alcohol use: No  . Drug use: No  . Sexual activity: Yes    Birth control/protection: Implant  Lifestyle  . Physical activity    Days per week: 0 days    Minutes per session: 0 min  . Stress: To some extent  Relationships  . Social connections    Talks on phone: More than three times a week    Gets together: Once a week    Attends religious service: Never    Active member of club or  organization: No    Attends meetings of clubs or organizations: Never    Relationship status: Married  . Intimate partner violence    Fear of current or ex partner: No    Emotionally abused: No    Physically abused: No    Forced sexual activity: No  Other Topics Concern  . Not on file  Social History Narrative  . Not on file     Review of Systems  Constitutional: Negative.   HENT: Negative.   Eyes: Negative.   Respiratory: Negative.   Cardiovascular: Negative.   Gastrointestinal: Negative.   Endocrine: Negative.   Genitourinary: Negative.   Musculoskeletal: Negative.   Skin: Negative.   Allergic/Immunologic: Negative.   Neurological: Negative.   Hematological: Negative.   Psychiatric/Behavioral: Negative.   All other systems reviewed and are negative.      Objective:    Vitals:   02/24/19 0914  BP: 132/70  Pulse: 73  Resp: 18  Temp: 98.2 F (36.8 C)  SpO2: 98%  Weight: 238 lb (108 kg)  Height: 5\' 2"  (1.575 m)      Physical Exam Vitals signs and nursing note reviewed.  Constitutional:      Appearance: She is well-developed.  HENT:     Head: Normocephalic and atraumatic.     Nose: Nose normal.  Eyes:     General:        Right eye: No discharge.        Left eye: No discharge.     Conjunctiva/sclera: Conjunctivae normal.  Neck:     Trachea: No tracheal deviation.  Cardiovascular:     Rate and Rhythm: Normal rate and regular rhythm.  Pulmonary:     Effort: Pulmonary effort is normal. No respiratory distress.     Breath sounds: No stridor.  Musculoskeletal: Normal range of motion.     Right wrist: She exhibits tenderness. She exhibits normal range of motion, no bony tenderness, no swelling, no effusion, no crepitus and no deformity.     Right hand: She exhibits tenderness. She exhibits normal range of motion, no bony tenderness, normal two-point discrimination, normal capillary refill, no laceration and no swelling. Normal sensation noted. Decreased  sensation is not present in the ulnar distribution, is not present in the medial distribution and is not present in the radial distribution. Normal strength noted. She exhibits no finger abduction, no thumb/finger opposition and no wrist extension trouble.     Comments: Very mild ttp to right radial dorsal wrist and over snuff box area  Skin:    General: Skin is warm and dry.     Findings: No rash.  Neurological:  Mental Status: She is alert.     Motor: No abnormal muscle tone.     Coordination: Coordination normal.  Psychiatric:        Behavior: Behavior normal.           Assessment & Plan:      ICD-10-CM   1. Right wrist pain  M25.531 DG Wrist Complete Right  2. Thumb tendonitis  M77.9     Right wrist pain possibly from an injury?  But mechanism was mild and no acute onset. Sx for 2 days are improving.  No deformity or evident swelling, tenderness is very mild and generalized to right radial dorsal wrist, normal ROM, strength, sensation and appearance Will obtain xray just to ensure no scaphoid pathology, I moreso suspect mild sprain vs mild tendonitis, tx with NSAIDs, rest/immobilizations, ice. Work note and prescription for right thumb spica given. F/up in 2 weeks if not improving.   Danelle BerryLeisa Adisen Bennion, PA-C 02/24/19 9:54 AM

## 2019-03-02 ENCOUNTER — Other Ambulatory Visit: Payer: Self-pay | Admitting: Family Medicine

## 2019-03-03 MED FILL — lamoTRIgine 100 MG TABS: 100 | 90 days supply | Qty: 180 | Fill #0

## 2019-03-08 ENCOUNTER — Encounter: Payer: Self-pay | Admitting: Family Medicine

## 2019-03-10 ENCOUNTER — Ambulatory Visit (INDEPENDENT_AMBULATORY_CARE_PROVIDER_SITE_OTHER): Payer: No Typology Code available for payment source | Admitting: Family Medicine

## 2019-03-10 ENCOUNTER — Encounter: Payer: Self-pay | Admitting: Family Medicine

## 2019-03-10 ENCOUNTER — Other Ambulatory Visit: Payer: Self-pay

## 2019-03-10 VITALS — BP 122/72 | HR 70 | Temp 98.1°F | Resp 18 | Ht 62.0 in | Wt 238.8 lb

## 2019-03-10 DIAGNOSIS — M25511 Pain in right shoulder: Secondary | ICD-10-CM

## 2019-03-10 DIAGNOSIS — E119 Type 2 diabetes mellitus without complications: Secondary | ICD-10-CM

## 2019-03-10 DIAGNOSIS — E559 Vitamin D deficiency, unspecified: Secondary | ICD-10-CM

## 2019-03-10 DIAGNOSIS — E7849 Other hyperlipidemia: Secondary | ICD-10-CM

## 2019-03-10 DIAGNOSIS — M2550 Pain in unspecified joint: Secondary | ICD-10-CM

## 2019-03-10 DIAGNOSIS — I10 Essential (primary) hypertension: Secondary | ICD-10-CM

## 2019-03-10 DIAGNOSIS — D509 Iron deficiency anemia, unspecified: Secondary | ICD-10-CM

## 2019-03-10 MED ORDER — HYDROCODONE-ACETAMINOPHEN 5-325 MG PO TABS: 1.0000 | ORAL_TABLET | Freq: Three times a day (TID) | ORAL | 0 refills | Status: AC | PRN

## 2019-03-10 MED ORDER — PREDNISONE 20 MG PO TABS: ORAL_TABLET | ORAL | 0 refills | Status: DC

## 2019-03-10 MED FILL — predniSONE 20 MG TABS: 20 | 6 days supply | Qty: 9 | Fill #0

## 2019-03-10 MED FILL — HYDROCODON-APAP 5-325: 5-325 | 5 days supply | Qty: 15 | Fill #0

## 2019-03-10 NOTE — Progress Notes (Signed)
Patient ID: Kaylee Bruinsundrea Kratzke, female    DOB: 12/25/1980, 38 y.o.   MRN: [id number]  PCP: Danelle Berryapia, Harly Pipkins, PA-C  Chief Complaint  Patient presents with  . Shoulder Pain    R shoulder, started x1 week and getting worst, no injuries, taken aleve, tramadol and tylenol    Subjective:   Kaylee Hill is a 38 y.o. female, presents to clinic with CC of severe right shoulder pain x 1 week with no preceding trauma or injury.  She states that it has bothered her intermittently here and there for about a month or 2 but 1 week ago without any contusion strain heavy lifting fall or injury she had insidious onset of diffuse right shoulder pain with decreased range of motion and pain radiates down her arm to her elbow.  It is extremely tender to the touch, pain is exacerbated with palpation and movement.  She states that when she goes to work she has adrenaline is just able to get done everything she needs to do but afterward she has severe pain.  She has not noticed any swelling or redness to her shoulder.  She has tried over-the-counter NSAIDs without any improvement.  Also took a tramadol which she got from somewhere which she says did not help at all, and no other aggravating or alleviating factors.  Pain fairly constant, severe, waking her up at night described as aching and throbbing and more sharp with movement and sore with palpation  She was seen 2 weeks ago and this was actually a follow-up visit for wrist and hand pain which resolved on its own without doing any bracing or other interventions.  She also several months ago had knee pain which was very similar with inflammation pain without any injury that resolved with just a certain amount of time and a knee brace.  She did see orthopedist for that instance. At her last visit she also described multiple other areas of joints with jumping pain without much injury, no swelling redness or radiographic abnormalities found any of these complaints.  She  unfortunately continues to come with new acute complaints and is often noncompliant with follow-up labs and tests.  She has multiple pending future labs for her various other chronic conditions including iron deficiency, vitamin D deficiency, hyperlipidemia, hypertension and diabetes.  She did not eat this morning prior to her appointment time so is willing to obtain labs today.  Patient Active Problem List   Diagnosis Date Noted  . Vitamin D deficiency 08/12/2018  . Iron deficiency anemia, unspecified 08/07/2018  . Class 2 obesity with body mass index (BMI) of 39.0 to 39.9 in adult 05/20/2018  . Type 2 diabetes mellitus (HCC) 05/01/2017  . Bipolar disorder with psychotic features (HCC) 04/28/2017  . Sickle cell trait (HCC) 12/09/2014  . Benign essential hypertension 04/09/2013  . Familial multiple lipoprotein-type hyperlipidemia 04/09/2013     Prior to Admission medications   Medication Sig Start Date End Date Taking? Authorizing Provider  ARIPiprazole (ABILIFY) 15 MG tablet Take 15 mg by mouth daily.  07/24/18  Yes [provider]  hydrocortisone (ANUSOL-HC) 25 MG suppository Place 1 suppository (25 mg total) rectally 2 (two) times daily as needed for hemorrhoids or anal itching. 09/10/18  Yes Danelle Berryapia, Rachel Rison, PA-C  IRON PO Take 325 mg by mouth daily.   Yes [provider]  Iron-FA-B Cmp-C-Biot-Probiotic (FUSION PLUS) CAPS Take 1 capsule by mouth at bedtime. 11/11/18  Yes Danelle Berryapia, Benedict Kue, PA-C  labetalol (NORMODYNE) 200 MG tablet Take  1 tablet (200 mg total) by mouth 2 (two) times daily. 08/31/18  Yes Cheral MarkerBooker, Kimberly R, CNM  lamoTRIgine (LAMICTAL) 100 MG tablet TAKE 2 TABLETS BY MOUTH AT BEDTIME. 03/03/19  Yes Danelle Berryapia, Lahna Nath, PA-C  Melatonin 5 MG TABS Take by mouth as needed.    Yes [provider]  metFORMIN (GLUCOPHAGE-XR) 500 MG 24 hr tablet TAKE 1 TABLET BY MOUTH DAILY WITH BREAKFAST. 08/19/18  Yes Danelle Berryapia, Bernardette Waldron, PA-C  polyethylene glycol powder (GLYCOLAX/MIRALAX) powder  Take 17 g by mouth daily. 09/10/18  Yes Danelle Berryapia, Hisham Provence, PA-C  HYDROcodone-acetaminophen (NORCO/VICODIN) 5-325 MG tablet Take 1 tablet by mouth 3 (three) times daily as needed for up to 5 days for moderate pain or severe pain. 03/10/19 03/15/19  Danelle Berryapia, Monda Chastain, PA-C  predniSONE (DELTASONE) 20 MG tablet 2 tabs poqday 1-3, 1 tabs poqday 4-6 03/10/19   Danelle Berryapia, Tristian Bouska, PA-C     No Known Allergies   Family History  Problem Relation Age of Onset  . Bipolar disorder Mother   . Diabetes Father   . Hypertension Father   . Schizophrenia Father   . Asthma Brother   . Diabetes Maternal Grandmother   . Cancer Maternal Grandmother        breast, pancreatic cancer  . Dementia Maternal Grandfather      Social History   Socioeconomic History  . Marital status: Married    Spouse name: Not on file  . Number of children: 1  . Years of education: Not on file  . Highest education level: Some college, no degree  Occupational History  . Occupation: Chief Executive Officerpharmacy tech    Employer: [employer]  Social Needs  . Financial resource strain: Not hard at all  . Food insecurity    Worry: Never true    Inability: Never true  . Transportation needs    Medical: No    Non-medical: No  Tobacco Use  . Smoking status: Former Smoker    Types: Cigarettes  . Smokeless tobacco: Never Used  Substance and Sexual Activity  . Alcohol use: No  . Drug use: No  . Sexual activity: Yes    Birth control/protection: Implant  Lifestyle  . Physical activity    Days per week: 0 days    Minutes per session: 0 min  . Stress: To some extent  Relationships  . Social connections    Talks on phone: More than three times a week    Gets together: Once a week    Attends religious service: Never    Active member of club or organization: No    Attends meetings of clubs or organizations: Never    Relationship status: Married  . Intimate partner violence    Fear of current or ex partner: No    Emotionally abused: No    Physically abused:  No    Forced sexual activity: No  Other Topics Concern  . Not on file  Social History Narrative  . Not on file     Review of Systems  Constitutional: Negative.   HENT: Negative.   Eyes: Negative.   Respiratory: Negative.   Cardiovascular: Negative.   Gastrointestinal: Negative.   Endocrine: Negative.   Genitourinary: Negative.   Musculoskeletal: Positive for arthralgias. Negative for gait problem, joint swelling and myalgias.  Skin: Negative.  Negative for color change.  Allergic/Immunologic: Negative.   Neurological: Negative.  Negative for numbness.  Hematological: Negative.  Negative for adenopathy. Does not bruise/bleed easily.  Psychiatric/Behavioral: Negative.   All other systems reviewed and are  negative.      Objective:    Vitals:   03/10/19 0924  BP: 122/72  Pulse: 70  Resp: 18  Temp: 98.1 F (36.7 C)  SpO2: 98%  Weight: 238 lb 12.8 oz (108.3 kg)  Height: 5\' 2"  (1.575 m)      Physical Exam Vitals signs and nursing note reviewed.  Constitutional:      Appearance: She is well-developed.  HENT:     Head: Normocephalic and atraumatic.     Nose: Nose normal.  Eyes:     General:        Right eye: No discharge.        Left eye: No discharge.     Conjunctiva/sclera: Conjunctivae normal.  Neck:     Trachea: No tracheal deviation.  Cardiovascular:     Rate and Rhythm: Normal rate and regular rhythm.     Pulses: Normal pulses.  Pulmonary:     Effort: Pulmonary effort is normal. No respiratory distress.     Breath sounds: No stridor.  Musculoskeletal: Normal range of motion.     Right shoulder: She exhibits tenderness and bony tenderness. She exhibits normal range of motion, no swelling, no effusion, no crepitus, no spasm, normal pulse and normal strength.     Comments: Generalized and out of proportion tenderness to palpation to entire right shoulder including biceps groove, glenohumeral fossa, entire deltoid area distal right clavicle and AC joint.   Normal passive and active range of motion. Negative drop test Normal sensation to light touch to right arm forearm and hand in all nerve distributions, 5 out of 5 grip strength No erythema or edema, no crepitus  Skin:    General: Skin is warm and dry.     Findings: No erythema or rash.  Neurological:     Mental Status: She is alert.     Motor: No abnormal muscle tone.     Coordination: Coordination normal.  Psychiatric:        Behavior: Behavior normal.           Assessment & Plan:      ICD-10-CM   1. Acute pain of right shoulder  M25.511 Ambulatory referral to Orthopedic Surgery  Will refer pt to ortho - and defer to them for Xrays.  Because of the patient's history of suspicious that her shoulder pain may be inflammatory?  And hopefully be very transient.  Will treat with steroids and pain medication.  Patient encouraged to rest and ice her arm.  The which she can set up her own specialist visits she was encouraged to call who she would like to go to and notify us if she has any difficulties setting that up.  predniSONE (DELTASONE) 20 MG tablet    HYDROcodone-acetaminophen (NORCO/VICODIN) 5-325 MG tablet  2. Polyarthralgia  M25.50 Sedimentation Rate    C-reactive protein    ANA  Over the past year since seeing this patient she has various joint pain complaints with tenderness on exam and generally lacking in other findings.  The probably all arthralgias and migratory arthralgias over the past year warrant additional work-up with inflammatory markers and will check ANA as well.  CBC to r/o elevated white count.      Other overdue follow up labs for other chronic conditions not addressed today, delay in f/up and labs due to COVID - see past visits for hx and plan  CBC with Differential/Platelet            3. Type 2 diabetes  mellitus without complication, without long-term current use of insulin (HCC)  E11.9 Hemoglobin A1c    Comprehensive metabolic panel    COMPLETE  METABOLIC PANEL WITH GFR    Hemoglobin A1c  4. Familial multiple lipoprotein-type hyperlipidemia  E78.49 Lipid panel    Comprehensive metabolic panel    Lipid panel  5. Iron deficiency anemia, unspecified iron deficiency anemia type  D50.9 CBC    CBC with Differential/Platelet  6. Benign essential hypertension  I10 Comprehensive metabolic panel    COMPLETE METABOLIC PANEL WITH GFR  7. Vitamin D deficiency  E55.9 Vitamin D 1,25 dihydroxy    VITAMIN D 25 Hydroxy (Vit-D Deficiency, Fractures)       Danelle BerryLeisa Chassidy Layson, PA-C 03/10/19 7:41 PM

## 2019-03-11 LAB — COMPLETE METABOLIC PANEL WITH GFR
AG Ratio: 1.3 (calc) (ref 1.0–2.5)
ALT: 15 U/L (ref 6–29)
AST: 20 U/L (ref 10–30)
Albumin: 4 g/dL (ref 3.6–5.1)
Alkaline phosphatase (APISO): 80 U/L (ref 31–125)
BUN: 7 mg/dL (ref 7–25)
CO2: 28 mmol/L (ref 20–32)
Calcium: 9.4 mg/dL (ref 8.6–10.2)
Chloride: 103 mmol/L (ref 98–110)
Creat: 0.66 mg/dL (ref 0.50–1.10)
GFR, Est African American: 128 mL/min/{1.73_m2} (ref >=60)
GFR, Est Non African American: 110 mL/min/{1.73_m2} (ref >=60)
Globulin: 3 g/dL (calc) (ref 1.9–3.7)
Glucose, Bld: 114 mg/dL — ABNORMAL HIGH (ref 65–99)
Potassium: 4.4 mmol/L (ref 3.5–5.3)
Sodium: 139 mmol/L (ref 135–146)
Total Bilirubin: 0.3 mg/dL (ref 0.2–1.2)
Total Protein: 7 g/dL (ref 6.1–8.1)

## 2019-03-11 LAB — C-REACTIVE PROTEIN: CRP: 12.7 mg/L — ABNORMAL HIGH (ref <8.0)

## 2019-03-11 LAB — LIPID PANEL
Cholesterol: 199 mg/dL (ref <200)
HDL: 43 mg/dL — ABNORMAL LOW (ref >=50)
LDL Cholesterol (Calc): 130 mg/dL (calc) — ABNORMAL HIGH
Non-HDL Cholesterol (Calc): 156 mg/dL (calc) — ABNORMAL HIGH (ref <130)
Total CHOL/HDL Ratio: 4.6 (calc) (ref <5.0)
Triglycerides: 151 mg/dL — ABNORMAL HIGH (ref <150)

## 2019-03-11 LAB — CBC WITH DIFFERENTIAL/PLATELET
Absolute Monocytes: 456 cells/uL (ref 200–950)
Basophils Absolute: 30 cells/uL (ref 0–200)
Basophils Relative: 0.5 %
Eosinophils Absolute: 150 cells/uL (ref 15–500)
Eosinophils Relative: 2.5 %
HCT: 35.4 % (ref 35.0–45.0)
Hemoglobin: 11.3 g/dL — ABNORMAL LOW (ref 11.7–15.5)
Lymphs Abs: 2034 cells/uL (ref 850–3900)
MCH: 24.7 pg — ABNORMAL LOW (ref 27.0–33.0)
MCHC: 31.9 g/dL — ABNORMAL LOW (ref 32.0–36.0)
MCV: 77.5 fL — ABNORMAL LOW (ref 80.0–100.0)
MPV: 10.4 fL (ref 7.5–12.5)
Monocytes Relative: 7.6 %
Neutro Abs: 3330 cells/uL (ref 1500–7800)
Neutrophils Relative %: 55.5 %
Platelets: 276 10*3/uL (ref 140–400)
RBC: 4.57 10*6/uL (ref 3.80–5.10)
RDW: 16.1 % — ABNORMAL HIGH (ref 11.0–15.0)
Total Lymphocyte: 33.9 %
WBC: 6 10*3/uL (ref 3.8–10.8)

## 2019-03-11 LAB — VITAMIN D 25 HYDROXY (VIT D DEFICIENCY, FRACTURES): Vit D, 25-Hydroxy: 21 ng/mL — ABNORMAL LOW (ref 30–100)

## 2019-03-11 LAB — HEMOGLOBIN A1C
Hgb A1c MFr Bld: 6.5 % of total Hgb — ABNORMAL HIGH (ref <5.7)
Mean Plasma Glucose: 140 (calc)
eAG (mmol/L): 7.7 (calc)

## 2019-03-11 LAB — SEDIMENTATION RATE: Sed Rate: 25 mm/h — ABNORMAL HIGH (ref 0–20)

## 2019-03-11 LAB — ANA: Anti Nuclear Antibody (ANA): NEGATIVE

## 2019-03-12 ENCOUNTER — Encounter: Payer: Self-pay | Admitting: Family Medicine

## 2019-03-12 ENCOUNTER — Other Ambulatory Visit: Payer: Self-pay | Admitting: Family Medicine

## 2019-03-12 MED ORDER — METFORMIN HCL 500 MG PO TABS: 500.0000 mg | ORAL_TABLET | Freq: Two times a day (BID) | ORAL | 0 refills | Status: DC

## 2019-03-12 MED FILL — metFORMIN HCL 500 MG TABS: 500 | 90 days supply | Qty: 180 | Fill #0

## 2019-03-12 NOTE — Addendum Note (Signed)
Addended by: Delsa Grana on: 03/12/2019 02:31 PM   Modules accepted: Orders

## 2019-03-22 MED FILL — LABETALOL HCL 200 MG TABS: 200 | 30 days supply | Qty: 60 | Fill #0

## 2019-03-22 MED FILL — FUSION PLUS CAPSULE: 30 days supply | Qty: 30 | Fill #0

## 2019-04-05 ENCOUNTER — Ambulatory Visit: Payer: No Typology Code available for payment source | Admitting: Neurology

## 2019-04-05 ENCOUNTER — Encounter: Payer: Self-pay | Admitting: Neurology

## 2019-04-05 ENCOUNTER — Other Ambulatory Visit: Payer: Self-pay

## 2019-04-05 VITALS — BP 130/86 | HR 69 | Ht 61.5 in | Wt 234.0 lb

## 2019-04-05 DIAGNOSIS — Z9989 Dependence on other enabling machines and devices: Secondary | ICD-10-CM

## 2019-04-05 DIAGNOSIS — G4733 Obstructive sleep apnea (adult) (pediatric): Secondary | ICD-10-CM | POA: Diagnosis not present

## 2019-04-05 DIAGNOSIS — R011 Cardiac murmur, unspecified: Secondary | ICD-10-CM

## 2019-04-05 NOTE — Progress Notes (Signed)
Subjective:    Patient ID: Kaylee Hill is a 38 y.o. female.  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  The patient is unaccompanied today.  I first met her on 12/16/2018 and virtual visit at the request of her primary care PA, at which time she reported snoring, excessive daytime somnolence and witnessed apneas.  She was advised to proceed with a home sleep test.  She had this on 01/12/2019 which indicated severe sleep apnea with an AHI of 64.3/h, O2 nadir of 81%.  She was advised to proceed with treatment at home in the form of AutoPap therapy. Today, 04/05/2019: I reviewed her AutoPap compliance data from 03/01/2019 through 03/30/2019 which is a total of 30 days, during which time she used her AutoPap every night with percent used days greater than 4 hours at 100%, indicating superb compliance with an average usage of 6 hours and 30 minutes, residual AHI at goal at 2.5/h, 95th percentile of pressure at 13.9 cm, range of 7 cm to 14 cm, leak in the acceptable range with the 95th percentile at 16.5 L/min.  She reports that AutoPap is going well.  She had a recent mask replacement. She has noticed improvement in her sleep quality and daytime energy.  Set up date was 02/01/2019.  She reports no medication changes.  She has been on a beta-blocker.  She has had some pain in her right shoulder and has tried topical Voltaren and more recently has tried CBD oil Gummies as needed.  She finds that that was helpful.  She uses this sparingly.  The patient's allergies, current medications, family history, past medical history, past social  history, past surgical history and problem list were reviewed and updated as appropriate.    Previously:  12/16/18: I am conducting a virtual, video based new patient visit via Webex in lieu of a face-to-face visit for evaluation of her sleep disorder, in particular, concern for underlying obstructive sleep apnea. The patient is unaccompanied today and joins via cell phone from home. She is referred by her primary care PA, Verline Lema, and I reviewed her note from 09/25/2018. Patient endorses snoring, witnessed apneas and excessive daytime somnolence. Her Epworth sleepiness score is 20 out of 24, fatigue severity score is 56 out of 63.  Note, she is on potentially sedating medications including Abilify, lamotrigine and a beta blocker, labetalol.she has been on Abilify for the past 5+ years, on Lamictal only for the past few months. 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 Bedtime is around midnight and rise time around 10. Her husband and her mother-in-law have witnessed breathing pauses while she is asleep. She has had infrequent episodes of sleep paralysis, happens maybe once every 3 months, has noticed this for the past few years, maybe as long as 7 years. Her snoring wakes her up at times and sometimes she has woken up with a sense of gasping for air. She does not have recurrent morning headaches or night to night nocturia. She's not aware of any family history of OSA. She's not had a tonsillectomy. She has 4 dogs in  the household, to our her mother-in-law's and 2 are affairs, they sleep on the floor in her bedroom. She lives with her husband, 46-year-old daughter, mother-in-law and sister-in-law. Her daughter sleeps in her own bed in their bedroom. She has a TV on at night for white noise. She does not drink caffeine on a daily basis secondary to feeling jittery from it. She does not utilize any alcohol currently and is a nonsmoker, she smoked one year  between 1999 and 2000. Her mood is stable currently on her medication regimen, she is followed by mental health.     Her Past Medical History Is Significant For: Past Medical History:  Diagnosis Date  . Anxiety   . Bipolar 1 disorder ([place])   . Depression   . Gestational diabetes   . Hypertension     Her Past Surgical History Is Significant For: Past Surgical History:  Procedure Laterality Date  . BREAST SURGERY      Her Family History Is Significant For: Family History  Problem Relation Age of Onset  . Bipolar disorder Mother   . Diabetes Father   . Hypertension Father   . Schizophrenia Father   . Asthma Brother   . Diabetes Maternal Grandmother   . Cancer Maternal Grandmother        breast, pancreatic cancer  . Dementia Maternal Grandfather     Her Social History Is Significant For: Social History   Socioeconomic History  . Marital status: Married    Spouse name: Not on file  . Number of children: 1  . Years of education: Not on file  . Highest education level: Some college, no degree  Occupational History  . Occupation: Marine scientist: [employer]  . Financial resource strain: Not hard at all  . Food insecurity    Worry: Never true    Inability: Never true  . Transportation needs    Medical: No    Non-medical: No  Tobacco Use  . Smoking status: Former Smoker    Types: Cigarettes  . Smokeless tobacco: Never Used  Substance and Sexual Activity  . Alcohol use: No  . Drug use: No  . Sexual activity: Yes    Birth control/protection: Implant  Lifestyle  . Physical activity    Days per week: 0 days    Minutes per session: 0 min  . Stress: To some extent  Relationships  . Social connections    Talks on phone: More than three times a week    Gets together: Once a week    Attends religious service: Never    Active member of club or organization: No    Attends meetings of clubs or organizations: Never    Relationship status:  Married  Other Topics Concern  . Not on file  Social History Narrative  . Not on file    Her Allergies Are:  No Known Allergies:   Her Current Medications Are:  Outpatient Encounter Medications as of 04/05/2019  Medication Sig  . ARIPiprazole (ABILIFY) 15 MG tablet Take 15 mg by mouth daily.   Marland Kitchen b complex vitamins tablet Take 1 tablet by mouth daily.  . ergocalciferol (VITAMIN D2) 1.25 MG (50000 UT) capsule Take 50,000 Units by mouth once a week.  . hydrocortisone (ANUSOL-HC) 25 MG suppository Place 1 suppository (25 mg total) rectally 2 (two) times daily as needed for hemorrhoids or anal itching.  . Iron-FA-B Cmp-C-Biot-Probiotic (FUSION PLUS) CAPS Take 1 capsule by mouth at bedtime.  Marland Kitchen  labetalol (NORMODYNE) 200 MG tablet Take 1 tablet (200 mg total) by mouth 2 (two) times daily.  Marland Kitchen lamoTRIgine (LAMICTAL) 100 MG tablet TAKE 2 TABLETS BY MOUTH AT BEDTIME.  . metFORMIN (GLUCOPHAGE) 500 MG tablet Take 1 tablet (500 mg total) by mouth 2 (two) times daily with a meal.  . Multiple Vitamin (MULTIVITAMIN) tablet Take 1 tablet by mouth daily.  . polyethylene glycol powder (GLYCOLAX/MIRALAX) powder Take 17 g by mouth daily.  . predniSONE (DELTASONE) 20 MG tablet 2 tabs poqday 1-3, 1 tabs poqday 4-6  . VITAMIN D PO Take 5,000 Units by mouth.  . [DISCONTINUED] IRON PO Take 325 mg by mouth daily.  . [DISCONTINUED] Melatonin 5 MG TABS Take by mouth as needed.    No facility-administered encounter medications on file as of 04/05/2019.   :  Review of Systems:  Out of a complete 14 point review of systems, all are reviewed and negative with the exception of these symptoms as listed below: Review of Systems  Neurological:       Pt presents today to discuss her auto pap. Pt has tried a new mask recently and that helps.    Objective:  Neurological Exam  Physical Exam Physical Examination:   Vitals:   04/05/19 0932  BP: 130/86  Pulse: 69   General Examination: The patient is a very pleasant  38 y.o. female in no acute distress. She appears well-developed and well-nourished and well groomed.   HEENT: Normocephalic, atraumatic, pupils are reactive to light. Extraocular tracking is good. Normal smooth pursuit is noted. Corrective eyeglasses in place. Hearing is grossly intact. Face is symmetric with normal facial animation and normal facial sensation. Speech is clear with no dysarthria noted. There is no hypophonia. There is no lip, neck/head, jaw or voice tremor.    Chest: Clear to auscultation without wheezing, rhonchi or crackles noted.  Heart: S1+S2+0, regular and normal with 2/6 systolic murmur noted. No rubs or gallops noted.   Abdomen: Soft, non-tender and non-distended with normal bowel sounds appreciated on auscultation.  Extremities: There is no pitting edema in the distal lower extremities bilaterally. Pedal pulses are intact.  Skin: Warm and dry without trophic changes noted.  Musculoskeletal: exam reveals no obvious joint deformities, tenderness or joint swelling or erythema.   Neurologically:  Mental status: The patient is awake, alert and oriented in all 4 spheres. Her immediate and remote memory, attention, language skills and fund of knowledge are appropriate. There is no evidence of aphasia, agnosia, apraxia or anomia. Speech is clear with normal prosody and enunciation. Thought process is linear. Mood is normal and affect is normal.  Cranial nerves II - XII are as described above under HEENT exam. In addition: shoulder shrug is normal with equal shoulder height noted. Motor exam: Normal bulk, strength and tone is noted. There is no tremor. Romberg is negative. Fine motor skills and coordination: grossly intact.  Cerebellar testing: No dysmetria or intention tremor. There is no truncal or gait ataxia.  Sensory exam: intact to light touch.  Gait, station and balance: She stands easily. No veering to one side is noted. No leaning to one side is noted. Posture is  age-appropriate and stance is narrow based. Gait shows normal stride length and normal pace. No problems turning are noted.               Assessment and Plan:  In summary, Kaylee Hill is a very pleasant 38 y.o.-year old female  with an underlying medical history of  hypertension, depression, anxiety, sickle cell trait, hyperlipidemia, iron deficiency, vitamin D deficiency, history of gestational diabetes and orbit obesity with a BMI of over 49, who Presents for follow-up consultation of her severe obstructive sleep apnea as determined by a home sleep test on 01/12/2019.  She is compliant with AutoPap therapy.  She has noticed improvement in her sleep quality and daytime energy.She is motivated to continue with treatment and has been working on weight loss with the help of a nutritionist.She was noted to have a mild systolic murmur today on examination which he was not aware of.  She is advised to discuss further with her primary care physician, she has not had any symptoms as in palpitations or shortness of breath but has noticed the occasional chest pressure with physical exertion.  She is advised to talk with Dr. Buelah Manis about the possibility of seeing a cardiologist.  She is advised that a lot of times murmurs are benign and flow related. She is advised to continue to be fully compliant with her AutoPap and commended for her treatment adherence thus far.  She is advised to routinely follow-up with 1 of our nurse practitioners in 6 months and hopefully yearly thereafter.  I answered all her questions today and she was in agreement.  I spent 20 minutes in total face-to-face time with the patient, more than 50% of which was spent in counseling and coordination of care, reviewing test results, reviewing medication and discussing or reviewing the diagnosis of OSA, its prognosis and treatment options. Pertinent laboratory and imaging test results that were available during this visit with the patient were reviewed  by me and considered in my medical decision making (see chart for details).

## 2019-04-05 NOTE — Patient Instructions (Signed)
Please continue using your autoPAP regularly. While your insurance requires that you use PAP at least 4 hours each night on 70% of the nights, I recommend, that you not skip any nights and use it throughout the night if you can. Getting used to PAP and staying with the treatment long term does take time and patience and discipline. Untreated obstructive sleep apnea when it is moderate to severe can have an adverse impact on cardiovascular health and raise her risk for heart disease, arrhythmias, hypertension, congestive heart failure, stroke and diabetes. Untreated obstructive sleep apnea causes sleep disruption, nonrestorative sleep, and sleep deprivation. This can have an impact on your day to day functioning and cause daytime sleepiness and impairment of cognitive function, memory loss, mood disturbance, and problems focussing. Using PAP regularly can improve these symptoms.  Keep up the good work! We can see you in 6 months and then yearly.   Please follow-up with your new primary care physician, Dr. Buelah Manis regarding the heart murmur as you have never been told he had a heart murmur, you have a faint systolic heart murmur which may very well be completely benign.  Please discuss with your primary care physician if you need further work-up or should meet with a cardiologist.

## 2019-04-07 NOTE — Progress Notes (Signed)
Office Visit Note  Patient: Kaylee Hill             Date of Birth: [birth year]           MRN: [id number]             PCP: Salley Scarleturham, Kawanta F, MD Referring: Danelle Berryapia, Leisa, PA-C Visit Date: 04/20/2019 Occupation: Pharmacy tech at [employer]  Subjective:  Pain in right shoulder.   History of Present Illness: Kaylee Hill is a 38 y.o. female seen in consultation per request of her PCP.  According to patient her symptoms started about 2-1/2 months ago with right thumb pain and decreased grip with her right thumb.  She states she took some NSAIDs and the symptoms resolved.  About 1-09/3525-month ago she started having right shoulder pain and stiffness she thought it was related to abnormal sleep position.  2 weeks later she tripped and fell on her cat which landed on her both hands.  She states couple of weeks later someone hugged her and she had a sharp pain in her right shoulder.  After that she started having difficulty lifting objects and raising her arm.  She took some NSAIDs over-the-counter and went to see a doctor at the urgent care.  She was given prednisone prescription which she took only for 2 days and discontinued as she is diabetic.  She was referred to Dr. Aundria Rudogers at [facility] who evaluated the patient and did x-ray of her right shoulder joint.  He found some abnormalities and gave her a prescription for Pennsaid.  She applied Pennsaid which helped her symptoms.  She was also given Duexis which she did not take.  She states the pain level improved a lot after Pennsaid.  But now the symptoms are starting again she states the pain in her right shoulder on the scale of 0-5 is about 1 at this time.  None of the other joints are painful.  She states she has chronic discomfort in her knee joints which she relates to her job.  She denies any history of joint swelling.  She also gives history of lower back pain for many years.  She has sometimes pain radiating to her left lower extremity.  Activities of  Daily Living:  Patient reports morning stiffness for 2 minutes.   Patient Denies nocturnal pain.  Difficulty dressing/grooming: Denies Difficulty climbing stairs: Reports Difficulty getting out of chair: Reports Difficulty using hands for taps, buttons, cutlery, and/or writing: Denies  Review of Systems  Constitutional: Positive for fatigue. Negative for night sweats, weight gain and weight loss.  HENT: Negative for mouth sores, trouble swallowing, trouble swallowing, mouth dryness and nose dryness.   Eyes: Negative for pain, redness, visual disturbance and dryness.  Respiratory: Negative for cough, shortness of breath and difficulty breathing.   Cardiovascular: Negative for chest pain, palpitations, hypertension, irregular heartbeat and swelling in legs/feet.  Gastrointestinal: Negative for blood in stool, constipation and diarrhea.  Endocrine: Negative for increased urination.  Genitourinary: Negative for vaginal dryness.  Musculoskeletal: Positive for arthralgias, joint pain, myalgias, morning stiffness and myalgias. Negative for joint swelling, muscle weakness and muscle tenderness.  Skin: Negative for color change, rash, hair loss, skin tightness, ulcers and sensitivity to sunlight.  Allergic/Immunologic: Negative for susceptible to infections.  Neurological: Negative for dizziness, memory loss, night sweats and weakness.  Hematological: Negative for swollen glands.  Psychiatric/Behavioral: Positive for depressed mood and sleep disturbance. The patient is nervous/anxious.     PMFS History:  Patient Active Problem List  Diagnosis Date Noted  . Vitamin D deficiency 08/12/2018  . Iron deficiency anemia, unspecified 08/07/2018  . Class 2 obesity with body mass index (BMI) of 39.0 to 39.9 in adult 05/20/2018  . Type 2 diabetes mellitus ([place]) 05/01/2017  . Bipolar disorder with psychotic features ([place]) 04/28/2017  . Sickle cell trait ([place]) 12/09/2014  . Benign essential hypertension  04/09/2013  . Familial multiple lipoprotein-type hyperlipidemia 04/09/2013    Past Medical History:  Diagnosis Date  . Anxiety   . Bipolar 1 disorder ([place])   . Depression   . Gestational diabetes   . Hypertension     Family History  Problem Relation Age of Onset  . Bipolar disorder Mother   . Diabetes Father   . Hypertension Father   . Schizophrenia Father   . Asthma Brother   . Healthy Daughter   . Diabetes Maternal Grandmother   . Cancer Maternal Grandmother        breast, pancreatic cancer  . Dementia Maternal Grandfather    Past Surgical History:  Procedure Laterality Date  . BREAST SURGERY     Social History   Social History Narrative  . Not on file   Immunization History  Administered Date(s) Administered  . Hepatitis B, adult 07/21/2014  . Influenza Whole 08/02/2013, 05/17/2014  . Influenza,inj,Quad PF,6+ Mos 05/19/2015, 04/25/2017  . Influenza-Unspecified 08/02/2014  . Meningococcal Polysaccharide 07/21/2014  . Tdap 04/09/2013, 04/18/2015     Objective: Vital Signs: BP 128/88 (BP Location: Right Wrist, Patient Position: Sitting, Cuff Size: Normal)   Pulse 76   Resp 14   Ht 5\' 2"  (1.575 m)   Wt 233 lb (105.7 kg)   BMI 42.62 kg/m    Physical Exam Vitals signs and nursing note reviewed.  Constitutional:      Appearance: She is well-developed.  HENT:     Head: Normocephalic and atraumatic.  Eyes:     Conjunctiva/sclera: Conjunctivae normal.  Neck:     Musculoskeletal: Normal range of motion.  Cardiovascular:     Rate and Rhythm: Normal rate and regular rhythm.     Heart sounds: Normal heart sounds.  Pulmonary:     Effort: Pulmonary effort is normal.     Breath sounds: Normal breath sounds.  Abdominal:     General: Bowel sounds are normal.     Palpations: Abdomen is soft.  Lymphadenopathy:     Cervical: No cervical adenopathy.  Skin:    General: Skin is warm and dry.     Capillary Refill: Capillary refill takes less than 2 seconds.   Neurological:     Mental Status: She is alert and oriented to person, place, and time.  Psychiatric:        Behavior: Behavior normal.      Musculoskeletal Exam: C-spine good range of motion.  She has excess of her lumbar lordosis.  She complains of discomfort in the lower lumbar region no point tenderness was noted.  She has some discomfort with right shoulder joint abduction.  Left shoulder joint was in good range of motion.  Elbow joints wrist joints with good range of motion.  She has mild right de Quervain's tenosynovitis.  No MCP PIP or DIP swelling or tenderness was noted.  Hip joints with good range of motion.  Knee joints with good range of motion with no synovitis or swelling.   CDAI Exam: CDAI Score: - Patient Global: -; Provider Global: - Swollen: -; Tender: - Joint Exam   No joint exam has been documented  for this visit   There is currently no information documented on the homunculus. Go to the Rheumatology activity and complete the homunculus joint exam.  Investigation: Findings:  11/05/18: HIV-, Hep B sur ag-, RPR- 03/10/19: Sed rate 25, CRP 12.7, ANA-  Component     Latest Ref Rng & Units 11/05/2018 03/10/2019  HIV Screen 4th Generation wRfx     Non Reactive Non Reactive   RPR     Non Reactive Non Reactive   Hepatitis B Surface Ag     Negative Negative   Sed Rate     0 - 20 mm/h  25 (H)  CRP     <8.0 mg/L  12.7 (H)  Anti Nuclear Antibody (ANA)     NEGATIVE  NEGATIVE   Imaging: Xr Lumbar Spine 2-3 Views  Result Date: 04/20/2019 No SI joint to sclerosis or narrowing was noted.  No significant disc space narrowing was noted.  Some facet joint arthropathy was noted. Impression: Facet joint arthropathy of the lumbar spine.   Recent Labs: Lab Results  Component Value Date   WBC 6.0 03/10/2019   HGB 11.3 (L) 03/10/2019   PLT 276 03/10/2019   NA 139 03/10/2019   K 4.4 03/10/2019   CL 103 03/10/2019   CO2 28 03/10/2019   GLUCOSE 114 (H) 03/10/2019   BUN 7  03/10/2019   CREATININE 0.66 03/10/2019   BILITOT 0.3 03/10/2019   ALKPHOS 85 04/27/2017   AST 20 03/10/2019   ALT 15 03/10/2019   PROT 7.0 03/10/2019   ALBUMIN 3.9 04/27/2017   CALCIUM 9.4 03/10/2019   GFRAA 128 03/10/2019    Speciality Comments: No specialty comments available.  Procedures:  No procedures performed Allergies: Patient has no known allergies.   Assessment / Plan:     Visit Diagnoses: Chronic right shoulder pain -patient has been having discomfort in her right shoulder for about 2-1/2 months.  She states the symptoms improved after using Pennsaid.  Some of the recurrence she has noted recently.  She had fairly good range of motion of her right shoulder joint.  She had x-rays done by [facility] which she will bring at her follow-up visit.  I will obtain following labs today.  Plan: Rheumatoid factor, Cyclic citrul peptide antibody, IgG, 14-3-3 eta Protein, Uric acid, Angiotensin converting enzyme, I have given her a handout on shoulder joint exercises.  I will also refer her to physical therapy.  De Quervain's tenosynovitis, right -patient states that she was having some discomfort in the right thumb.  The symptoms have almost resolved.  She has some tenderness on palpation.  Primary osteoarthritis of both knees -patient complains of intermittent discomfort in her knee joints.  No warmth swelling or effusion was noted.  Mild osteoarthritis noted on the x-rays from 2019.  A handout on knee joint exercises was given.  Chronic midline low back pain with left-sided sciatica -she complains of positive longstanding discomfort in her lower back.  She has marked lumbar lordosis.  Plan: XR Lumbar Spine 2-3 Views, x-ray of the lumbar spine showed mild facet joint arthropathy.  A handout on back exercises was given.  Benign essential hypertension -blood pressure is only mildly elevated.  Type 2 diabetes mellitus without complication, without long-term current use of insulin ([facility])   Bipolar disorder with psychotic features ([facility])  Familial multiple lipoprotein-type hyperlipidemia   Sickle cell trait ([facility])   Vitamin D deficiency   History of iron deficiency anemia  History of sleep apnea - Uses  CPAP   Orders: Orders Placed This Encounter  Procedures  . XR Lumbar Spine 2-3 Views  . Rheumatoid factor  . Cyclic citrul peptide antibody, IgG  . 14-3-3 eta Protein  . Uric acid  . Angiotensin converting enzyme  . Ambulatory referral to Physical Therapy   No orders of the defined types were placed in this encounter.   Face-to-face time spent with patient was 50 minutes. Greater than 50% of time was spent in counseling and coordination of care.  Follow-Up Instructions: Return for Pain in multiple joints.   Pollyann SavoyShaili Nation Cradle, MD  Note - This record has been created using Animal [employer]Dragon software.  Chart creation errors have been sought, but may not always  have been located. Such creation errors do not reflect on  the standard of medical care.

## 2019-04-09 DIAGNOSIS — M19011 Primary osteoarthritis, right shoulder: Secondary | ICD-10-CM | POA: Insufficient documentation

## 2019-04-20 ENCOUNTER — Encounter: Payer: Self-pay | Admitting: Rheumatology

## 2019-04-20 ENCOUNTER — Ambulatory Visit (INDEPENDENT_AMBULATORY_CARE_PROVIDER_SITE_OTHER): Payer: No Typology Code available for payment source

## 2019-04-20 ENCOUNTER — Ambulatory Visit: Payer: No Typology Code available for payment source | Admitting: Rheumatology

## 2019-04-20 ENCOUNTER — Other Ambulatory Visit: Payer: Self-pay

## 2019-04-20 VITALS — BP 128/88 | HR 76 | Resp 14 | Ht 62.0 in | Wt 233.0 lb

## 2019-04-20 DIAGNOSIS — Z862 Personal history of diseases of the blood and blood-forming organs and certain disorders involving the immune mechanism: Secondary | ICD-10-CM

## 2019-04-20 DIAGNOSIS — M25511 Pain in right shoulder: Secondary | ICD-10-CM

## 2019-04-20 DIAGNOSIS — M5442 Lumbago with sciatica, left side: Secondary | ICD-10-CM | POA: Diagnosis not present

## 2019-04-20 DIAGNOSIS — M654 Radial styloid tenosynovitis [de Quervain]: Secondary | ICD-10-CM

## 2019-04-20 DIAGNOSIS — I10 Essential (primary) hypertension: Secondary | ICD-10-CM

## 2019-04-20 DIAGNOSIS — E119 Type 2 diabetes mellitus without complications: Secondary | ICD-10-CM

## 2019-04-20 DIAGNOSIS — Z8669 Personal history of other diseases of the nervous system and sense organs: Secondary | ICD-10-CM

## 2019-04-20 DIAGNOSIS — E559 Vitamin D deficiency, unspecified: Secondary | ICD-10-CM

## 2019-04-20 DIAGNOSIS — G8929 Other chronic pain: Secondary | ICD-10-CM | POA: Diagnosis not present

## 2019-04-20 DIAGNOSIS — F319 Bipolar disorder, unspecified: Secondary | ICD-10-CM

## 2019-04-20 DIAGNOSIS — M170 Bilateral primary osteoarthritis of knee: Secondary | ICD-10-CM

## 2019-04-20 DIAGNOSIS — D573 Sickle-cell trait: Secondary | ICD-10-CM

## 2019-04-20 DIAGNOSIS — E7849 Other hyperlipidemia: Secondary | ICD-10-CM

## 2019-04-20 NOTE — Patient Instructions (Signed)
Shoulder Exercises Ask your health care provider which exercises are safe for you. Do exercises exactly as told by your health care provider and adjust them as directed. It is normal to feel mild stretching, pulling, tightness, or discomfort as you do these exercises. Stop right away if you feel sudden pain or your pain gets worse. Do not begin these exercises until told by your health care provider. Stretching exercises External rotation and abduction This exercise is sometimes called corner stretch. This exercise rotates your arm outward (external rotation) and moves your arm out from your body (abduction). 1. Stand in a doorway with one of your feet slightly in front of the other. This is called a staggered stance. If you cannot reach your forearms to the door frame, stand facing a corner of a room. 2. Choose one of the following positions as told by your health care provider: ? Place your hands and forearms on the door frame above your head. ? Place your hands and forearms on the door frame at the height of your head. ? Place your hands on the door frame at the height of your elbows. 3. Slowly move your weight onto your front foot until you feel a stretch across your chest and in the front of your shoulders. Keep your head and chest upright and keep your abdominal muscles tight. 4. Hold for __________ seconds. 5. To release the stretch, shift your weight to your back foot. Repeat __________ times. Complete this exercise __________ times a day. Extension, standing 1. Stand and hold a broomstick, a cane, or a similar object behind your back. ? Your hands should be a little wider than shoulder width apart. ? Your palms should face away from your back. 2. Keeping your elbows straight and your shoulder muscles relaxed, move the stick away from your body until you feel a stretch in your shoulders (extension). ? Avoid shrugging your shoulders while you move the stick. Keep your shoulder blades tucked  down toward the middle of your back. 3. Hold for __________ seconds. 4. Slowly return to the starting position. Repeat __________ times. Complete this exercise __________ times a day. Range-of-motion exercises Pendulum  1. Stand near a wall or a surface that you can hold onto for balance. 2. Bend at the waist and let your left / right arm hang straight down. Use your other arm to support you. Keep your back straight and do not lock your knees. 3. Relax your left / right arm and shoulder muscles, and move your hips and your trunk so your left / right arm swings freely. Your arm should swing because of the motion of your body, not because you are using your arm or shoulder muscles. 4. Keep moving your hips and trunk so your arm swings in the following directions, as told by your health care provider: ? Side to side. ? Forward and backward. ? In clockwise and counterclockwise circles. 5. Continue each motion for __________ seconds, or for as long as told by your health care provider. 6. Slowly return to the starting position. Repeat __________ times. Complete this exercise __________ times a day. Shoulder flexion, standing  1. Stand and hold a broomstick, a cane, or a similar object. Place your hands a little more than shoulder width apart on the object. Your left / right hand should be palm up, and your other hand should be palm down. 2. Keep your elbow straight and your shoulder muscles relaxed. Push the stick up with your healthy arm to  raise your left / right arm in front of your body, and then over your head until you feel a stretch in your shoulder (flexion). ? Avoid shrugging your shoulder while you raise your arm. Keep your shoulder blade tucked down toward the middle of your back. 3. Hold for __________ seconds. 4. Slowly return to the starting position. Repeat __________ times. Complete this exercise __________ times a day. Shoulder abduction, standing 1. Stand and hold a broomstick,  a cane, or a similar object. Place your hands a little more than shoulder width apart on the object. Your left / right hand should be palm up, and your other hand should be palm down. 2. Keep your elbow straight and your shoulder muscles relaxed. Push the object across your body toward your left / right side. Raise your left / right arm to the side of your body (abduction) until you feel a stretch in your shoulder. ? Do not raise your arm above shoulder height unless your health care provider tells you to do that. ? If directed, raise your arm over your head. ? Avoid shrugging your shoulder while you raise your arm. Keep your shoulder blade tucked down toward the middle of your back. 3. Hold for __________ seconds. 4. Slowly return to the starting position. Repeat __________ times. Complete this exercise __________ times a day. Internal rotation  1. Place your left / right hand behind your back, palm up. 2. Use your other hand to dangle an exercise band, a towel, or a similar object over your shoulder. Grasp the band with your left / right hand so you are holding on to both ends. 3. Gently pull up on the band until you feel a stretch in the front of your left / right shoulder. The movement of your arm toward the center of your body is called internal rotation. ? Avoid shrugging your shoulder while you raise your arm. Keep your shoulder blade tucked down toward the middle of your back. 4. Hold for __________ seconds. 5. Release the stretch by letting go of the band and lowering your hands. Repeat __________ times. Complete this exercise __________ times a day. Strengthening exercises External rotation  1. Sit in a stable chair without armrests. 2. Secure an exercise band to a stable object at elbow height on your left / right side. 3. Place a soft object, such as a folded towel or a small pillow, between your left / right upper arm and your body to move your elbow about 4 inches (10 cm) away  from your side. 4. Hold the end of the exercise band so it is tight and there is no slack. 5. Keeping your elbow pressed against the soft object, slowly move your forearm out, away from your abdomen (external rotation). Keep your body steady so only your forearm moves. 6. Hold for __________ seconds. 7. Slowly return to the starting position. Repeat __________ times. Complete this exercise __________ times a day. Shoulder abduction  1. Sit in a stable chair without armrests, or stand up. 2. Hold a __________ weight in your left / right hand, or hold an exercise band with both hands. 3. Start with your arms straight down and your left / right palm facing in, toward your body. 4. Slowly lift your left / right hand out to your side (abduction). Do not lift your hand above shoulder height unless your health care provider tells you that this is safe. ? Keep your arms straight. ? Avoid shrugging your shoulder while you  do this movement. Keep your shoulder blade tucked down toward the middle of your back. 5. Hold for __________ seconds. 6. Slowly lower your arm, and return to the starting position. Repeat __________ times. Complete this exercise __________ times a day. Shoulder extension 1. Sit in a stable chair without armrests, or stand up. 2. Secure an exercise band to a stable object in front of you so it is at shoulder height. 3. Hold one end of the exercise band in each hand. Your palms should face each other. 4. Straighten your elbows and lift your hands up to shoulder height. 5. Step back, away from the secured end of the exercise band, until the band is tight and there is no slack. 6. Squeeze your shoulder blades together as you pull your hands down to the sides of your thighs (extension). Stop when your hands are straight down by your sides. Do not let your hands go behind your body. 7. Hold for __________ seconds. 8. Slowly return to the starting position. Repeat __________ times.  Complete this exercise __________ times a day. Shoulder row 1. Sit in a stable chair without armrests, or stand up. 2. Secure an exercise band to a stable object in front of you so it is at waist height. 3. Hold one end of the exercise band in each hand. Position your palms so that your thumbs are facing the ceiling (neutral position). 4. Bend each of your elbows to a 90-degree angle (right angle) and keep your upper arms at your sides. 5. Step back until the band is tight and there is no slack. 6. Slowly pull your elbows back behind you. 7. Hold for __________ seconds. 8. Slowly return to the starting position. Repeat __________ times. Complete this exercise __________ times a day. Shoulder press-ups  1. Sit in a stable chair that has armrests. Sit upright, with your feet flat on the floor. 2. Put your hands on the armrests so your elbows are bent and your fingers are pointing forward. Your hands should be about even with the sides of your body. 3. Push down on the armrests and use your arms to lift yourself off the chair. Straighten your elbows and lift yourself up as much as you comfortably can. ? Move your shoulder blades down, and avoid letting your shoulders move up toward your ears. ? Keep your feet on the ground. As you get stronger, your feet should support less of your body weight as you lift yourself up. 4. Hold for __________ seconds. 5. Slowly lower yourself back into the chair. Repeat __________ times. Complete this exercise __________ times a day. Wall push-ups  1. Stand so you are facing a stable wall. Your feet should be about one arm-length away from the wall. 2. Lean forward and place your palms on the wall at shoulder height. 3. Keep your feet flat on the floor as you bend your elbows and lean forward toward the wall. 4. Hold for __________ seconds. 5. Straighten your elbows to push yourself back to the starting position. Repeat __________ times. Complete this exercise  __________ times a day. This information is not intended to replace advice given to you by your health care provider. Make sure you discuss any questions you have with your health care provider. Document Released: 07/03/2005 Document Revised: 12/11/2018 Document Reviewed: 09/18/2018 Elsevier Patient Education  2020 [employer], 15(4), [phone]. Retrieved June 08, 2018 from [email or web address].com/[email or web address]">  Knee Exercises Ask your health care provider which exercises are safe  for you. Do exercises exactly as told by your health care provider and adjust them as directed. It is normal to feel mild stretching, pulling, tightness, or discomfort as you do these exercises. Stop right away if you feel sudden pain or your pain gets worse. Do not begin these exercises until told by your health care provider. Stretching and range-of-motion exercises These exercises warm up your muscles and joints and improve the movement and flexibility of your knee. These exercises also help to relieve pain and swelling. Knee extension, prone 1. Lie on your abdomen (prone position) on a bed. 2. Place your left / right knee just beyond the edge of the surface so your knee is not on the bed. You can put a towel under your left / right thigh just above your kneecap for comfort. 3. Relax your leg muscles and allow gravity to straighten your knee (extension). You should feel a stretch behind your left / right knee. 4. Hold this position for __________ seconds. 5. Scoot up so your knee is supported between repetitions. Repeat __________ times. Complete this exercise __________ times a day. Knee flexion, active  1. Lie on your back with both legs straight. If this causes back discomfort, bend your left / right knee so your foot is flat on the floor. 2. Slowly slide your left / right heel back toward your buttocks. Stop when you feel a gentle stretch in the front of your knee or thigh  (flexion). 3. Hold this position for __________ seconds. 4. Slowly slide your left / right heel back to the starting position. Repeat __________ times. Complete this exercise __________ times a day. Quadriceps stretch, prone  1. Lie on your abdomen on a firm surface, such as a bed or padded floor. 2. Bend your left / right knee and hold your ankle. If you cannot reach your ankle or pant leg, loop a belt around your foot and grab the belt instead. 3. Gently pull your heel toward your buttocks. Your knee should not slide out to the side. You should feel a stretch in the front of your thigh and knee (quadriceps). 4. Hold this position for __________ seconds. Repeat __________ times. Complete this exercise __________ times a day. Hamstring, supine 1. Lie on your back (supine position). 2. Loop a belt or towel over the ball of your left / right foot. The ball of your foot is on the walking surface, right under your toes. 3. Straighten your left / right knee and slowly pull on the belt to raise your leg until you feel a gentle stretch behind your knee (hamstring). ? Do not let your knee bend while you do this. ? Keep your other leg flat on the floor. 4. Hold this position for __________ seconds. Repeat __________ times. Complete this exercise __________ times a day. Strengthening exercises These exercises build strength and endurance in your knee. Endurance is the ability to use your muscles for a long time, even after they get tired. Quadriceps, isometric This exercise stretches the muscles in front of your thigh (quadriceps) without moving your knee joint (isometric). 1. Lie on your back with your left / right leg extended and your other knee bent. Put a rolled towel or small pillow under your knee if told by your health care provider. 2. Slowly tense the muscles in the front of your left / right thigh. You should see your kneecap slide up toward your hip or see increased dimpling just above the  knee. This motion will  push the back of the knee toward the floor. 3. For __________ seconds, hold the muscle as tight as you can without increasing your pain. 4. Relax the muscles slowly and completely. Repeat __________ times. Complete this exercise __________ times a day. Straight leg raises This exercise stretches the muscles in front of your thigh (quadriceps) and the muscles that move your hips (hip flexors). 1. Lie on your back with your left / right leg extended and your other knee bent. 2. Tense the muscles in the front of your left / right thigh. You should see your kneecap slide up or see increased dimpling just above the knee. Your thigh may even shake a bit. 3. Keep these muscles tight as you raise your leg 4-6 inches (10-15 cm) off the floor. Do not let your knee bend. 4. Hold this position for __________ seconds. 5. Keep these muscles tense as you lower your leg. 6. Relax your muscles slowly and completely after each repetition. Repeat __________ times. Complete this exercise __________ times a day. Hamstring, isometric 1. Lie on your back on a firm surface. 2. Bend your left / right knee about __________ degrees. 3. Dig your left / right heel into the surface as if you are trying to pull it toward your buttocks. Tighten the muscles in the back of your thighs (hamstring) to "dig" as hard as you can without increasing any pain. 4. Hold this position for __________ seconds. 5. Release the tension gradually and allow your muscles to relax completely for __________ seconds after each repetition. Repeat __________ times. Complete this exercise __________ times a day. Hamstring curls If told by your health care provider, do this exercise while wearing ankle weights. Begin with __________ lb weights. Then increase the weight by 1 lb (0.5 kg) increments. Do not wear ankle weights that are more than __________ lb. 1. Lie on your abdomen with your legs straight. 2. Bend your left / right  knee as far as you can without feeling pain. Keep your hips flat against the floor. 3. Hold this position for __________ seconds. 4. Slowly lower your leg to the starting position. Repeat __________ times. Complete this exercise __________ times a day. Squats This exercise strengthens the muscles in front of your thigh and knee (quadriceps). 1. Stand in front of a table, with your feet and knees pointing straight ahead. You may rest your hands on the table for balance but not for support. 2. Slowly bend your knees and lower your hips like you are going to sit in a chair. ? Keep your weight over your heels, not over your toes. ? Keep your lower legs upright so they are parallel with the table legs. ? Do not let your hips go lower than your knees. ? Do not bend lower than told by your health care provider. ? If your knee pain increases, do not bend as low. 3. Hold the squat position for __________ seconds. 4. Slowly push with your legs to return to standing. Do not use your hands to pull yourself to standing. Repeat __________ times. Complete this exercise __________ times a day. Wall slides This exercise strengthens the muscles in front of your thigh and knee (quadriceps). 1. Lean your back against a smooth wall or door, and walk your feet out 18-24 inches (46-61 cm) from it. 2. Place your feet hip-width apart. 3. Slowly slide down the wall or door until your knees bend __________ degrees. Keep your knees over your heels, not over your toes. Keep  your knees in line with your hips. 4. Hold this position for __________ seconds. Repeat __________ times. Complete this exercise __________ times a day. Straight leg raises This exercise strengthens the muscles that rotate the leg at the hip and move it away from your body (hip abductors). 1. Lie on your side with your left / right leg in the top position. Lie so your head, shoulder, knee, and hip line up. You may bend your bottom knee to help you  keep your balance. 2. Roll your hips slightly forward so your hips are stacked directly over each other and your left / right knee is facing forward. 3. Leading with your heel, lift your top leg 4-6 inches (10-15 cm). You should feel the muscles in your outer hip lifting. ? Do not let your foot drift forward. ? Do not let your knee roll toward the ceiling. 4. Hold this position for __________ seconds. 5. Slowly return your leg to the starting position. 6. Let your muscles relax completely after each repetition. Repeat __________ times. Complete this exercise __________ times a day. Straight leg raises This exercise stretches the muscles that move your hips away from the front of the pelvis (hip extensors). 1. Lie on your abdomen on a firm surface. You can put a pillow under your hips if that is more comfortable. 2. Tense the muscles in your buttocks and lift your left / right leg about 4-6 inches (10-15 cm). Keep your knee straight as you lift your leg. 3. Hold this position for __________ seconds. 4. Slowly lower your leg to the starting position. 5. Let your leg relax completely after each repetition. Repeat __________ times. Complete this exercise __________ times a day. This information is not intended to replace advice given to you by your health care provider. Make sure you discuss any questions you have with your health care provider. Document Released: 07/03/2005 Document Revised: 06/09/2018 Document Reviewed: 06/09/2018 Elsevier Patient Education  2020 [employer]. Back Exercises The following exercises strengthen the muscles that help to support the trunk and back. They also help to keep the lower back flexible. Doing these exercises can help to prevent back pain or lessen existing pain.  If you have back pain or discomfort, try doing these exercises 2-3 times each day or as told by your health care provider.  As your pain improves, do them once each day, but increase the number  of times that you repeat the steps for each exercise (do more repetitions).  To prevent the recurrence of back pain, continue to do these exercises once each day or as told by your health care provider. Do exercises exactly as told by your health care provider and adjust them as directed. It is normal to feel mild stretching, pulling, tightness, or discomfort as you do these exercises, but you should stop right away if you feel sudden pain or your pain gets worse. Exercises Single knee to chest Repeat these steps 3-5 times for each leg: 1. Lie on your back on a firm bed or the floor with your legs extended. 2. Bring one knee to your chest. Your other leg should stay extended and in contact with the floor. 3. Hold your knee in place by grabbing your knee or thigh with both hands and hold. 4. Pull on your knee until you feel a gentle stretch in your lower back or buttocks. 5. Hold the stretch for 10-30 seconds. 6. Slowly release and straighten your leg. Pelvic tilt Repeat these steps 5-10  times: 1. Lie on your back on a firm bed or the floor with your legs extended. 2. Bend your knees so they are pointing toward the ceiling and your feet are flat on the floor. 3. Tighten your lower abdominal muscles to press your lower back against the floor. This motion will tilt your pelvis so your tailbone points up toward the ceiling instead of pointing to your feet or the floor. 4. With gentle tension and even breathing, hold this position for 5-10 seconds. Cat-cow Repeat these steps until your lower back becomes more flexible: 1. Get into a hands-and-knees position on a firm surface. Keep your hands under your shoulders, and keep your knees under your hips. You may place padding under your knees for comfort. 2. Let your head hang down toward your chest. Contract your abdominal muscles and point your tailbone toward the floor so your lower back becomes rounded like the back of a cat. 3. Hold this position  for 5 seconds. 4. Slowly lift your head, let your abdominal muscles relax and point your tailbone up toward the ceiling so your back forms a sagging arch like the back of a cow. 5. Hold this position for 5 seconds.  Press-ups Repeat these steps 5-10 times: 1. Lie on your abdomen (face-down) on the floor. 2. Place your palms near your head, about shoulder-width apart. 3. Keeping your back as relaxed as possible and keeping your hips on the floor, slowly straighten your arms to raise the top half of your body and lift your shoulders. Do not use your back muscles to raise your upper torso. You may adjust the placement of your hands to make yourself more comfortable. 4. Hold this position for 5 seconds while you keep your back relaxed. 5. Slowly return to lying flat on the floor.  Bridges Repeat these steps 10 times: 1. Lie on your back on a firm surface. 2. Bend your knees so they are pointing toward the ceiling and your feet are flat on the floor. Your arms should be flat at your sides, next to your body. 3. Tighten your buttocks muscles and lift your buttocks off the floor until your waist is at almost the same height as your knees. You should feel the muscles working in your buttocks and the back of your thighs. If you do not feel these muscles, slide your feet 1-2 inches farther away from your buttocks. 4. Hold this position for 3-5 seconds. 5. Slowly lower your hips to the starting position, and allow your buttocks muscles to relax completely. If this exercise is too easy, try doing it with your arms crossed over your chest. Abdominal crunches Repeat these steps 5-10 times: 1. Lie on your back on a firm bed or the floor with your legs extended. 2. Bend your knees so they are pointing toward the ceiling and your feet are flat on the floor. 3. Cross your arms over your chest. 4. Tip your chin slightly toward your chest without bending your neck. 5. Tighten your abdominal muscles and slowly  raise your trunk (torso) high enough to lift your shoulder blades a tiny bit off the floor. Avoid raising your torso higher than that because it can put too much stress on your low back and does not help to strengthen your abdominal muscles. 6. Slowly return to your starting position. Back lifts Repeat these steps 5-10 times: 1. Lie on your abdomen (face-down) with your arms at your sides, and rest your forehead on the floor. 2.  Tighten the muscles in your legs and your buttocks. 3. Slowly lift your chest off the floor while you keep your hips pressed to the floor. Keep the back of your head in line with the curve in your back. Your eyes should be looking at the floor. 4. Hold this position for 3-5 seconds. 5. Slowly return to your starting position. Contact a health care provider if:  Your back pain or discomfort gets much worse when you do an exercise.  Your worsening back pain or discomfort does not lessen within 2 hours after you exercise. If you have any of these problems, stop doing these exercises right away. Do not do them again unless your health care provider says that you can. Get help right away if:  You develop sudden, severe back pain. If this happens, stop doing the exercises right away. Do not do them again unless your health care provider says that you can. This information is not intended to replace advice given to you by your health care provider. Make sure you discuss any questions you have with your health care provider. Document Released: 09/26/2004 Document Revised: 12/24/2018 Document Reviewed: 05/21/2018 Elsevier Patient Education  2020 [employer]

## 2019-04-25 LAB — ANGIOTENSIN CONVERTING ENZYME: Angiotensin-Converting Enzyme: 36 U/L (ref 9–67)

## 2019-04-25 LAB — 14-3-3 ETA PROTEIN: 14-3-3 eta Protein: <0.2 ng/mL (ref <0.2)

## 2019-04-25 LAB — URIC ACID: Uric Acid, Serum: 4.8 mg/dL (ref 2.5–7.0)

## 2019-04-25 LAB — RHEUMATOID FACTOR: Rheumatoid fact SerPl-aCnc: <14 IU/mL (ref <14)

## 2019-04-25 LAB — CYCLIC CITRUL PEPTIDE ANTIBODY, IGG: Cyclic Citrullin Peptide Ab: <16 UNITS

## 2019-04-26 NOTE — Progress Notes (Signed)
I will discuss results at the follow-up visit.

## 2019-05-07 NOTE — Progress Notes (Deleted)
Office Visit Note  Patient: Kaylee Hill             Date of Birth: Dec 20, 1980           MRN: [id number]             PCP: Salley Scarleturham, Kawanta F, MD Referring: Danelle Berryapia, Leisa, PA-C Visit Date: 05/20/2019 Occupation: @GUAROCC @  Subjective:  No chief complaint on file.   History of Present Illness: Kaylee Hill is a 38 y.o. female ***   Activities of Daily Living:  Patient reports morning stiffness for *** {minute/hour:19697}.   Patient {ACTIONS;DENIES/REPORTS:21021675::"Denies"} nocturnal pain.  Difficulty dressing/grooming: {ACTIONS;DENIES/REPORTS:21021675::"Denies"} Difficulty climbing stairs: {ACTIONS;DENIES/REPORTS:21021675::"Denies"} Difficulty getting out of chair: {ACTIONS;DENIES/REPORTS:21021675::"Denies"} Difficulty using hands for taps, buttons, cutlery, and/or writing: {ACTIONS;DENIES/REPORTS:21021675::"Denies"}  No Rheumatology ROS completed.   PMFS History:  Patient Active Problem List   Diagnosis Date Noted  . Vitamin D deficiency 08/12/2018  . Iron deficiency anemia, unspecified 08/07/2018  . Class 2 obesity with body mass index (BMI) of 39.0 to 39.9 in adult 05/20/2018  . Type 2 diabetes mellitus (HCC) 05/01/2017  . Bipolar disorder with psychotic features (HCC) 04/28/2017  . Sickle cell trait (HCC) 12/09/2014  . Benign essential hypertension 04/09/2013  . Familial multiple lipoprotein-type hyperlipidemia 04/09/2013    Past Medical History:  Diagnosis Date  . Anxiety   . Bipolar 1 disorder (HCC)   . Depression   . Gestational diabetes   . Hypertension     Family History  Problem Relation Age of Onset  . Bipolar disorder Mother   . Diabetes Father   . Hypertension Father   . Schizophrenia Father   . Asthma Brother   . Healthy Daughter   . Diabetes Maternal Grandmother   . Cancer Maternal Grandmother        breast, pancreatic cancer  . Dementia Maternal Grandfather    Past Surgical History:  Procedure Laterality Date  . BREAST SURGERY      Social History   Social History Narrative  . Not on file   Immunization History  Administered Date(s) Administered  . Hepatitis B, adult 07/21/2014  . Influenza Whole 08/02/2013, 05/17/2014  . Influenza,inj,Quad PF,6+ Mos 05/19/2015, 04/25/2017  . Influenza-Unspecified 08/02/2014  . Meningococcal Polysaccharide 07/21/2014  . Tdap 04/09/2013, 04/18/2015     Objective: Vital Signs: There were no vitals taken for this visit.   Physical Exam   Musculoskeletal Exam: ***  CDAI Exam: CDAI Score: - Patient Global: -; Provider Global: - Swollen: -; Tender: - Joint Exam   No joint exam has been documented for this visit   There is currently no information documented on the homunculus. Go to the Rheumatology activity and complete the homunculus joint exam.  Investigation: No additional findings.  Imaging: Xr Lumbar Spine 2-3 Views  Result Date: 04/20/2019 No SI joint to sclerosis or narrowing was noted.  No significant disc space narrowing was noted.  Some facet joint arthropathy was noted. Impression: Facet joint arthropathy of the lumbar spine.   Recent Labs: Lab Results  Component Value Date   WBC 6.0 03/10/2019   HGB 11.3 (L) 03/10/2019   PLT 276 03/10/2019   NA 139 03/10/2019   K 4.4 03/10/2019   CL 103 03/10/2019   CO2 28 03/10/2019   GLUCOSE 114 (H) 03/10/2019   BUN 7 03/10/2019   CREATININE 0.66 03/10/2019   BILITOT 0.3 03/10/2019   ALKPHOS 85 04/27/2017   AST 20 03/10/2019   ALT 15 03/10/2019   PROT 7.0 03/10/2019   ALBUMIN  3.9 04/27/2017   CALCIUM 9.4 03/10/2019   GFRAA 128 03/10/2019  April 20, 2019 RF negative, anti-CCP negative, 14 3 3  eta negative, uric acid 4.8, ACE 36  11/05/18: HIV-, Hep B sur ag-, RPR- 03/10/19: Sed rate 25, CRP 12.7, ANA-  Speciality Comments: No specialty comments available.  Procedures:  No procedures performed Allergies: Patient has no known allergies.   Assessment / Plan:     Visit Diagnoses: No diagnosis found.   Orders: No orders of the defined types were placed in this encounter.  No orders of the defined types were placed in this encounter.   Face-to-face time spent with patient was *** minutes. Greater than 50% of time was spent in counseling and coordination of care.  Follow-Up Instructions: No follow-ups on file.   Bo Merino, MD  Note - This record has been created using Editor, commissioning.  Chart creation errors have been sought, but may not always  have been located. Such creation errors do not reflect on  the standard of medical care.

## 2019-05-14 ENCOUNTER — Other Ambulatory Visit: Payer: Self-pay | Admitting: Women's Health

## 2019-05-14 MED FILL — FUSION PLUS CAPSULE: 30 days supply | Qty: 30 | Fill #1

## 2019-05-17 MED FILL — LABETALOL HCL 200 MG TABS: 200 | 30 days supply | Qty: 60 | Fill #0

## 2019-05-20 ENCOUNTER — Ambulatory Visit: Payer: No Typology Code available for payment source | Admitting: Rheumatology

## 2019-05-21 NOTE — Progress Notes (Signed)
Office Visit Note  Patient: Kaylee Hill             Date of Birth: 31-Dec-1980           MRN: [id number]             PCP: Alycia Rossetti, MD Referring: Delsa Grana, PA-C Visit Date: 06/01/2019 Occupation: @GUAROCC @  Subjective:  Pain in multiple joints.   History of Present Illness: Blessing Zaucha is a 38 y.o. female with history of polyarthralgia.  She states her shoulder joint discomfort improved over time.  She has been doing the exercises which I gave her at the last visit.  The de Quervain's tenosynovitis in her right thumb is better.  She has been using over-the-counter diclofenac gel which is been helpful.  She is intermittent discomfort in her left thumb.  She continues to have some discomfort in her knee joints.  She states she walks and stands for several hours at work.  She also continues to have some lower back discomfort.  She denies any radiculopathy.  She denies any history of joint swelling.  Activities of Daily Living:  Patient reports morning stiffness for 1-2 hours.   Patient Reports nocturnal pain.  Difficulty dressing/grooming: Denies Difficulty climbing stairs: Reports Difficulty getting out of chair: Reports Difficulty using hands for taps, buttons, cutlery, and/or writing: Reports  Review of Systems  Constitutional: Negative for fatigue, night sweats, weight gain and weight loss.  HENT: Negative for mouth sores, trouble swallowing, trouble swallowing, mouth dryness and nose dryness.   Eyes: Negative for pain, redness, itching, visual disturbance and dryness.  Respiratory: Negative for cough, shortness of breath, wheezing and difficulty breathing.   Cardiovascular: Negative for chest pain, palpitations, hypertension, irregular heartbeat and swelling in legs/feet.  Gastrointestinal: Negative for abdominal pain, blood in stool, constipation and diarrhea.  Endocrine: Negative for increased urination.  Genitourinary: Negative for difficulty urinating, painful  urination and vaginal dryness.  Musculoskeletal: Positive for arthralgias, joint pain and morning stiffness. Negative for joint swelling, myalgias, muscle weakness, muscle tenderness and myalgias.  Skin: Negative for color change, rash, hair loss, skin tightness, ulcers and sensitivity to sunlight.  Allergic/Immunologic: Negative for susceptible to infections.  Neurological: Negative for dizziness, light-headedness, numbness, headaches, memory loss, night sweats and weakness.  Hematological: Negative for bruising/bleeding tendency and swollen glands.  Psychiatric/Behavioral: Negative for depressed mood, confusion and sleep disturbance. The patient is not nervous/anxious.     PMFS History:  Patient Active Problem List   Diagnosis Date Noted  . Arthropathy of lumbar facet joint 06/01/2019  . Primary osteoarthritis of both knees 06/01/2019  . Vitamin D deficiency 08/12/2018  . Iron deficiency anemia, unspecified 08/07/2018  . Class 2 obesity with body mass index (BMI) of 39.0 to 39.9 in adult 05/20/2018  . Type 2 diabetes mellitus ([place]) 05/01/2017  . Bipolar disorder with psychotic features ([place]) 04/28/2017  . Sickle cell trait ([place]) 12/09/2014  . Benign essential hypertension 04/09/2013  . Familial multiple lipoprotein-type hyperlipidemia 04/09/2013    Past Medical History:  Diagnosis Date  . Anxiety   . Bipolar 1 disorder ([place])   . Depression   . Gestational diabetes   . Hypertension     Family History  Problem Relation Age of Onset  . Bipolar disorder Mother   . Diabetes Father   . Hypertension Father   . Schizophrenia Father   . Asthma Brother   . Healthy Daughter   . Diabetes Maternal Grandmother   . Cancer Maternal Grandmother  breast, pancreatic cancer  . Dementia Maternal Grandfather    Past Surgical History:  Procedure Laterality Date  . BREAST SURGERY     Social History   Social History Narrative  . Not on file   Immunization History  Administered  Date(s) Administered  . Hepatitis B, adult 07/21/2014  . Influenza Whole 08/02/2013, 05/17/2014  . Influenza,inj,Quad PF,6+ Mos 05/19/2015, 04/25/2017  . Influenza-Unspecified 08/02/2014  . Meningococcal Polysaccharide 07/21/2014  . Tdap 04/09/2013, 04/18/2015     Objective: Vital Signs: BP 131/83 (BP Location: Left Arm, Patient Position: Sitting, Cuff Size: Normal)   Pulse 81   Resp 15   Ht 5\' 2"  (1.575 m)   Wt 235 lb (106.6 kg)   BMI 42.98 kg/m    Physical Exam Vitals signs and nursing note reviewed.  Constitutional:      Appearance: She is well-developed.  HENT:     Head: Normocephalic and atraumatic.  Eyes:     Conjunctiva/sclera: Conjunctivae normal.  Neck:     Musculoskeletal: Normal range of motion.  Cardiovascular:     Rate and Rhythm: Normal rate and regular rhythm.     Heart sounds: Normal heart sounds.  Pulmonary:     Effort: Pulmonary effort is normal.     Breath sounds: Normal breath sounds.  Abdominal:     General: Bowel sounds are normal.     Palpations: Abdomen is soft.  Lymphadenopathy:     Cervical: No cervical adenopathy.  Skin:    General: Skin is warm and dry.     Capillary Refill: Capillary refill takes less than 2 seconds.  Neurological:     Mental Status: She is alert and oriented to person, place, and time.  Psychiatric:        Behavior: Behavior normal.      Musculoskeletal Exam: She had good range of motion of her cervical and lumbar spine.  She has a stiffness in the lower back with range of motion.  Shoulder joints, elbow joints, wrist joints with good range of motion.  MCPs PIPs and DIPs with good range of motion with no synovitis.  Hip joints, knee joints, ankles MTPs PIPs with good range of motion with no synovitis.  She is crepitus in her bilateral knee joints.  CDAI Exam: CDAI Score: - Patient Global: -; Provider Global: - Swollen: -; Tender: - Joint Exam   No joint exam has been documented for this visit   There is  currently no information documented on the homunculus. Go to the Rheumatology activity and complete the homunculus joint exam.  Investigation: No additional findings.  Imaging: No results found.  Recent Labs: Lab Results  Component Value Date   WBC 6.0 03/10/2019   HGB 11.3 (L) 03/10/2019   PLT 276 03/10/2019   NA 139 03/10/2019   K 4.4 03/10/2019   CL 103 03/10/2019   CO2 28 03/10/2019   GLUCOSE 114 (H) 03/10/2019   BUN 7 03/10/2019   CREATININE 0.66 03/10/2019   BILITOT 0.3 03/10/2019   ALKPHOS 85 04/27/2017   AST 20 03/10/2019   ALT 15 03/10/2019   PROT 7.0 03/10/2019   ALBUMIN 3.9 04/27/2017   CALCIUM 9.4 03/10/2019   GFRAA 128 03/10/2019  April 20, 2019 RF negative, anti-CCP negative, 14 3 3  ETA negative, uric acid 4.8, ACE 36  11/05/18: HIV-, Hep B sur ag-, RPR- 03/10/19: Sed rate 25, CRP 12.7, ANA-  Speciality Comments: No specialty comments available.  Procedures:  No procedures performed Allergies: Patient has no known  allergies.   Assessment / Plan:     Visit Diagnoses: Chronic right shoulder pain - All autoimmune work-up was negative.  Labs were discussed at length.  Patient was referred to physical therapy but she did not go as her symptoms improved.  I have advised her to contact me in case she has recurrence of symptoms.  De Quervain's tenosynovitis, right-improved by using diclofenac gel.  Primary osteoarthritis of both knees - bilateral mild..  The x-rays of the knee joints were reviewed.  Have given her a handout on knee exercises.  Weight loss diet and exercise was emphasized.  Arthropathy of lumbar facet joint - Patient has chronic lower back pain.  Handout on back exercises was given.  BMI 40.0-44.9, adult (HCC)-dietary modifications and regular exercise was discussed at length.  Benign essential hypertension-her diastolic blood pressure is mildly elevated.  Other medical problems are listed as follows.  Familial multiple lipoprotein-type  hyperlipidemia  Type 2 diabetes mellitus without complication, without long-term current use of insulin (HCC)  Bipolar disorder with psychotic features (HCC)  History of iron deficiency anemia  Sickle cell trait (HCC)  Vitamin D deficiency  History of sleep apnea  Orders: No orders of the defined types were placed in this encounter.  No orders of the defined types were placed in this encounter.     Follow-Up Instructions: Return if symptoms worsen or fail to improve, for Osteoarthritis.   Pollyann SavoyShaili Sidonia Nutter, MD  Note - This record has been created using [employer] nutritionistDragon software.  Chart creation errors have been sought, but may not always  have been located. Such creation errors do not reflect on  the standard of medical care.

## 2019-05-31 MED FILL — lamoTRIgine 100 MG TABS: 100 | 90 days supply | Qty: 180 | Fill #0

## 2019-05-31 MED FILL — ARIPiprazole 15 MG TABS: 15 | 90 days supply | Qty: 90 | Fill #0

## 2019-06-01 ENCOUNTER — Ambulatory Visit: Payer: No Typology Code available for payment source | Admitting: Rheumatology

## 2019-06-01 ENCOUNTER — Encounter: Payer: Self-pay | Admitting: Rheumatology

## 2019-06-01 ENCOUNTER — Other Ambulatory Visit: Payer: Self-pay

## 2019-06-01 VITALS — BP 131/83 | HR 81 | Resp 15 | Ht 62.0 in | Wt 235.0 lb

## 2019-06-01 DIAGNOSIS — Z6841 Body Mass Index (BMI) 40.0 and over, adult: Secondary | ICD-10-CM

## 2019-06-01 DIAGNOSIS — D573 Sickle-cell trait: Secondary | ICD-10-CM

## 2019-06-01 DIAGNOSIS — I10 Essential (primary) hypertension: Secondary | ICD-10-CM

## 2019-06-01 DIAGNOSIS — M654 Radial styloid tenosynovitis [de Quervain]: Secondary | ICD-10-CM | POA: Diagnosis not present

## 2019-06-01 DIAGNOSIS — M47816 Spondylosis without myelopathy or radiculopathy, lumbar region: Secondary | ICD-10-CM | POA: Insufficient documentation

## 2019-06-01 DIAGNOSIS — F319 Bipolar disorder, unspecified: Secondary | ICD-10-CM

## 2019-06-01 DIAGNOSIS — M25511 Pain in right shoulder: Secondary | ICD-10-CM

## 2019-06-01 DIAGNOSIS — M170 Bilateral primary osteoarthritis of knee: Secondary | ICD-10-CM | POA: Insufficient documentation

## 2019-06-01 DIAGNOSIS — E559 Vitamin D deficiency, unspecified: Secondary | ICD-10-CM

## 2019-06-01 DIAGNOSIS — E119 Type 2 diabetes mellitus without complications: Secondary | ICD-10-CM

## 2019-06-01 DIAGNOSIS — G8929 Other chronic pain: Secondary | ICD-10-CM

## 2019-06-01 DIAGNOSIS — Z8669 Personal history of other diseases of the nervous system and sense organs: Secondary | ICD-10-CM

## 2019-06-01 DIAGNOSIS — E7849 Other hyperlipidemia: Secondary | ICD-10-CM

## 2019-06-01 DIAGNOSIS — Z862 Personal history of diseases of the blood and blood-forming organs and certain disorders involving the immune mechanism: Secondary | ICD-10-CM

## 2019-06-01 NOTE — Patient Instructions (Addendum)
Back Exercises The following exercises strengthen the muscles that help to support the trunk and back. They also help to keep the lower back flexible. Doing these exercises can help to prevent back pain or lessen existing pain.  If you have back pain or discomfort, try doing these exercises 2-3 times each day or as told by your health care provider.  As your pain improves, do them once each day, but increase the number of times that you repeat the steps for each exercise (do more repetitions).  To prevent the recurrence of back pain, continue to do these exercises once each day or as told by your health care provider. Do exercises exactly as told by your health care provider and adjust them as directed. It is normal to feel mild stretching, pulling, tightness, or discomfort as you do these exercises, but you should stop right away if you feel sudden pain or your pain gets worse. Exercises Single knee to chest Repeat these steps 3-5 times for each leg: 1. Lie on your back on a firm bed or the floor with your legs extended. 2. Bring one knee to your chest. Your other leg should stay extended and in contact with the floor. 3. Hold your knee in place by grabbing your knee or thigh with both hands and hold. 4. Pull on your knee until you feel a gentle stretch in your lower back or buttocks. 5. Hold the stretch for 10-30 seconds. 6. Slowly release and straighten your leg. Pelvic tilt Repeat these steps 5-10 times: 1. Lie on your back on a firm bed or the floor with your legs extended. 2. Bend your knees so they are pointing toward the ceiling and your feet are flat on the floor. 3. Tighten your lower abdominal muscles to press your lower back against the floor. This motion will tilt your pelvis so your tailbone points up toward the ceiling instead of pointing to your feet or the floor. 4. With gentle tension and even breathing, hold this position for 5-10 seconds. Cat-cow Repeat these steps until  your lower back becomes more flexible: 1. Get into a hands-and-knees position on a firm surface. Keep your hands under your shoulders, and keep your knees under your hips. You may place padding under your knees for comfort. 2. Let your head hang down toward your chest. Contract your abdominal muscles and point your tailbone toward the floor so your lower back becomes rounded like the back of a cat. 3. Hold this position for 5 seconds. 4. Slowly lift your head, let your abdominal muscles relax and point your tailbone up toward the ceiling so your back forms a sagging arch like the back of a cow. 5. Hold this position for 5 seconds.  Press-ups Repeat these steps 5-10 times: 1. Lie on your abdomen (face-down) on the floor. 2. Place your palms near your head, about shoulder-width apart. 3. Keeping your back as relaxed as possible and keeping your hips on the floor, slowly straighten your arms to raise the top half of your body and lift your shoulders. Do not use your back muscles to raise your upper torso. You may adjust the placement of your hands to make yourself more comfortable. 4. Hold this position for 5 seconds while you keep your back relaxed. 5. Slowly return to lying flat on the floor.  Bridges Repeat these steps 10 times: 1. Lie on your back on a firm surface. 2. Bend your knees so they are pointing toward the ceiling and   your feet are flat on the floor. Your arms should be flat at your sides, next to your body. 3. Tighten your buttocks muscles and lift your buttocks off the floor until your waist is at almost the same height as your knees. You should feel the muscles working in your buttocks and the back of your thighs. If you do not feel these muscles, slide your feet 1-2 inches farther away from your buttocks. 4. Hold this position for 3-5 seconds. 5. Slowly lower your hips to the starting position, and allow your buttocks muscles to relax completely. If this exercise is too easy, try  doing it with your arms crossed over your chest. Abdominal crunches Repeat these steps 5-10 times: 1. Lie on your back on a firm bed or the floor with your legs extended. 2. Bend your knees so they are pointing toward the ceiling and your feet are flat on the floor. 3. Cross your arms over your chest. 4. Tip your chin slightly toward your chest without bending your neck. 5. Tighten your abdominal muscles and slowly raise your trunk (torso) high enough to lift your shoulder blades a tiny bit off the floor. Avoid raising your torso higher than that because it can put too much stress on your low back and does not help to strengthen your abdominal muscles. 6. Slowly return to your starting position. Back lifts Repeat these steps 5-10 times: 1. Lie on your abdomen (face-down) with your arms at your sides, and rest your forehead on the floor. 2. Tighten the muscles in your legs and your buttocks. 3. Slowly lift your chest off the floor while you keep your hips pressed to the floor. Keep the back of your head in line with the curve in your back. Your eyes should be looking at the floor. 4. Hold this position for 3-5 seconds. 5. Slowly return to your starting position. Contact a health care provider if:  Your back pain or discomfort gets much worse when you do an exercise.  Your worsening back pain or discomfort does not lessen within 2 hours after you exercise. If you have any of these problems, stop doing these exercises right away. Do not do them again unless your health care provider says that you can. Get help right away if:  You develop sudden, severe back pain. If this happens, stop doing the exercises right away. Do not do them again unless your health care provider says that you can. This information is not intended to replace advice given to you by your health care provider. Make sure you discuss any questions you have with your health care provider. Document Released: 09/26/2004 Document  Revised: 12/24/2018 Document Reviewed: 05/21/2018 [employer] Patient Education  2020 [employer] Journal for Nurse Practitioners, 15(4), 263-267. Retrieved June 08, 2018 from http://clinicalkey.com/nursing">  Knee Exercises Ask your health care provider which exercises are safe for you. Do exercises exactly as told by your health care provider and adjust them as directed. It is normal to feel mild stretching, pulling, tightness, or discomfort as you do these exercises. Stop right away if you feel sudden pain or your pain gets worse. Do not begin these exercises until told by your health care provider. Stretching and range-of-motion exercises These exercises warm up your muscles and joints and improve the movement and flexibility of your knee. These exercises also help to relieve pain and swelling. Knee extension, prone 1. Lie on your abdomen (prone position) on a bed. 2. Place your left / right knee   just beyond the edge of the surface so your knee is not on the bed. You can put a towel under your left / right thigh just above your kneecap for comfort. 3. Relax your leg muscles and allow gravity to straighten your knee (extension). You should feel a stretch behind your left / right knee. 4. Hold this position for __________ seconds. 5. Scoot up so your knee is supported between repetitions. Repeat __________ times. Complete this exercise __________ times a day. Knee flexion, active  1. Lie on your back with both legs straight. If this causes back discomfort, bend your left / right knee so your foot is flat on the floor. 2. Slowly slide your left / right heel back toward your buttocks. Stop when you feel a gentle stretch in the front of your knee or thigh (flexion). 3. Hold this position for __________ seconds. 4. Slowly slide your left / right heel back to the starting position. Repeat __________ times. Complete this exercise __________ times a day. Quadriceps stretch, prone  1. Lie on your  abdomen on a firm surface, such as a bed or padded floor. 2. Bend your left / right knee and hold your ankle. If you cannot reach your ankle or pant leg, loop a belt around your foot and grab the belt instead. 3. Gently pull your heel toward your buttocks. Your knee should not slide out to the side. You should feel a stretch in the front of your thigh and knee (quadriceps). 4. Hold this position for __________ seconds. Repeat __________ times. Complete this exercise __________ times a day. Hamstring, supine 1. Lie on your back (supine position). 2. Loop a belt or towel over the ball of your left / right foot. The ball of your foot is on the walking surface, right under your toes. 3. Straighten your left / right knee and slowly pull on the belt to raise your leg until you feel a gentle stretch behind your knee (hamstring). ? Do not let your knee bend while you do this. ? Keep your other leg flat on the floor. 4. Hold this position for __________ seconds. Repeat __________ times. Complete this exercise __________ times a day. Strengthening exercises These exercises build strength and endurance in your knee. Endurance is the ability to use your muscles for a long time, even after they get tired. Quadriceps, isometric This exercise stretches the muscles in front of your thigh (quadriceps) without moving your knee joint (isometric). 1. Lie on your back with your left / right leg extended and your other knee bent. Put a rolled towel or small pillow under your knee if told by your health care provider. 2. Slowly tense the muscles in the front of your left / right thigh. You should see your kneecap slide up toward your hip or see increased dimpling just above the knee. This motion will push the back of the knee toward the floor. 3. For __________ seconds, hold the muscle as tight as you can without increasing your pain. 4. Relax the muscles slowly and completely. Repeat __________ times. Complete this  exercise __________ times a day. Straight leg raises This exercise stretches the muscles in front of your thigh (quadriceps) and the muscles that move your hips (hip flexors). 1. Lie on your back with your left / right leg extended and your other knee bent. 2. Tense the muscles in the front of your left / right thigh. You should see your kneecap slide up or see increased dimpling just above   the knee. Your thigh may even shake a bit. 3. Keep these muscles tight as you raise your leg 4-6 inches (10-15 cm) off the floor. Do not let your knee bend. 4. Hold this position for __________ seconds. 5. Keep these muscles tense as you lower your leg. 6. Relax your muscles slowly and completely after each repetition. Repeat __________ times. Complete this exercise __________ times a day. Hamstring, isometric 1. Lie on your back on a firm surface. 2. Bend your left / right knee about __________ degrees. 3. Dig your left / right heel into the surface as if you are trying to pull it toward your buttocks. Tighten the muscles in the back of your thighs (hamstring) to "dig" as hard as you can without increasing any pain. 4. Hold this position for __________ seconds. 5. Release the tension gradually and allow your muscles to relax completely for __________ seconds after each repetition. Repeat __________ times. Complete this exercise __________ times a day. Hamstring curls If told by your health care provider, do this exercise while wearing ankle weights. Begin with __________ lb weights. Then increase the weight by 1 lb (0.5 kg) increments. Do not wear ankle weights that are more than __________ lb. 1. Lie on your abdomen with your legs straight. 2. Bend your left / right knee as far as you can without feeling pain. Keep your hips flat against the floor. 3. Hold this position for __________ seconds. 4. Slowly lower your leg to the starting position. Repeat __________ times. Complete this exercise __________  times a day. Squats This exercise strengthens the muscles in front of your thigh and knee (quadriceps). 1. Stand in front of a table, with your feet and knees pointing straight ahead. You may rest your hands on the table for balance but not for support. 2. Slowly bend your knees and lower your hips like you are going to sit in a chair. ? Keep your weight over your heels, not over your toes. ? Keep your lower legs upright so they are parallel with the table legs. ? Do not let your hips go lower than your knees. ? Do not bend lower than told by your health care provider. ? If your knee pain increases, do not bend as low. 3. Hold the squat position for __________ seconds. 4. Slowly push with your legs to return to standing. Do not use your hands to pull yourself to standing. Repeat __________ times. Complete this exercise __________ times a day. Wall slides This exercise strengthens the muscles in front of your thigh and knee (quadriceps). 1. Lean your back against a smooth wall or door, and walk your feet out 18-24 inches (46-61 cm) from it. 2. Place your feet hip-width apart. 3. Slowly slide down the wall or door until your knees bend __________ degrees. Keep your knees over your heels, not over your toes. Keep your knees in line with your hips. 4. Hold this position for __________ seconds. Repeat __________ times. Complete this exercise __________ times a day. Straight leg raises This exercise strengthens the muscles that rotate the leg at the hip and move it away from your body (hip abductors). 1. Lie on your side with your left / right leg in the top position. Lie so your head, shoulder, knee, and hip line up. You may bend your bottom knee to help you keep your balance. 2. Roll your hips slightly forward so your hips are stacked directly over each other and your left / right knee is facing   forward. 3. Leading with your heel, lift your top leg 4-6 inches (10-15 cm). You should feel the  muscles in your outer hip lifting. ? Do not let your foot drift forward. ? Do not let your knee roll toward the ceiling. 4. Hold this position for __________ seconds. 5. Slowly return your leg to the starting position. 6. Let your muscles relax completely after each repetition. Repeat __________ times. Complete this exercise __________ times a day. Straight leg raises This exercise stretches the muscles that move your hips away from the front of the pelvis (hip extensors). 1. Lie on your abdomen on a firm surface. You can put a pillow under your hips if that is more comfortable. 2. Tense the muscles in your buttocks and lift your left / right leg about 4-6 inches (10-15 cm). Keep your knee straight as you lift your leg. 3. Hold this position for __________ seconds. 4. Slowly lower your leg to the starting position. 5. Let your leg relax completely after each repetition. Repeat __________ times. Complete this exercise __________ times a day. This information is not intended to replace advice given to you by your health care provider. Make sure you discuss any questions you have with your health care provider. Document Released: 07/03/2005 Document Revised: 06/09/2018 Document Reviewed: 06/09/2018 [employer] Patient Education  2020 [employer]  

## 2019-06-21 NOTE — Progress Notes (Signed)
Office Visit Note  Patient: Kaylee Hill             Date of Birth: [birth year]           MRN: [id number]             PCP: Salley Scarlet, MD Referring: Salley Scarlet, MD Visit Date: 06/23/2019 Occupation: @GUAROCC @  Subjective:  Right shoulder joint pain   History of Present Illness: Kaylee Hill is a 38 y.o. female with history of osteoarthritis.  Patient presents today with right shoulder joint pain which restarted about 1 week ago.  She denies any injuries recently. She states she woke up with the discomfort in the right shoulder joint.  She states the pain is sharp and needlelike.  She states the pain started to radiate down the back of her right arm.  She has not tried physical therapy or cortisone injection yet.  She did bring x-rays with her today to the office. She continues to have chronic pain in both knee joints but denies any joint swelling.  She says she has intermittent discomfort in her lower back which she attributes to the activity she performs at her job.  She states that the de Quervain's tenosynovitis of the right wrist has resolved.     Activities of Daily Living:  Patient reports morning stiffness for 1 minute.   Patient Reports nocturnal pain.  Difficulty dressing/grooming: Denies Difficulty climbing stairs: Denies Difficulty getting out of chair: Denies Difficulty using hands for taps, buttons, cutlery, and/or writing: Reports  Review of Systems  Constitutional: Negative for fatigue.  HENT: Negative for mouth sores, mouth dryness and nose dryness.   Eyes: Negative for pain, itching, visual disturbance and dryness.  Respiratory: Negative for cough, hemoptysis, shortness of breath and difficulty breathing.   Cardiovascular: Negative for chest pain, palpitations, hypertension and swelling in legs/feet.  Gastrointestinal: Negative for blood in stool, constipation and diarrhea.  Endocrine: Negative for increased urination.  Genitourinary: Negative for  difficulty urinating and painful urination.  Musculoskeletal: Positive for arthralgias, joint pain and morning stiffness. Negative for joint swelling, myalgias, muscle weakness, muscle tenderness and myalgias.  Skin: Negative for color change, pallor, rash, hair loss, nodules/bumps, skin tightness, ulcers and sensitivity to sunlight.  Allergic/Immunologic: Negative for susceptible to infections.  Neurological: Negative for dizziness, light-headedness, headaches and memory loss.  Hematological: Negative for bruising/bleeding tendency and swollen glands.  Psychiatric/Behavioral: Positive for sleep disturbance. Negative for depressed mood and confusion. The patient is not nervous/anxious.     PMFS History:  Patient Active Problem List   Diagnosis Date Noted  . Arthropathy of lumbar facet joint 06/01/2019  . Primary osteoarthritis of both knees 06/01/2019  . Vitamin D deficiency 08/12/2018  . Iron deficiency anemia, unspecified 08/07/2018  . Class 2 obesity with body mass index (BMI) of 39.0 to 39.9 in adult 05/20/2018  . Type 2 diabetes mellitus (HCC) 05/01/2017  . Bipolar disorder with psychotic features (HCC) 04/28/2017  . Sickle cell trait (HCC) 12/09/2014  . Benign essential hypertension 04/09/2013  . Familial multiple lipoprotein-type hyperlipidemia 04/09/2013    Past Medical History:  Diagnosis Date  . Anxiety   . Bipolar 1 disorder (HCC)   . Depression   . Gestational diabetes   . Hypertension     Family History  Problem Relation Age of Onset  . Bipolar disorder Mother   . Diabetes Father   . Hypertension Father   . Schizophrenia Father   . Asthma Brother   . Healthy  Daughter   . Diabetes Maternal Grandmother   . Cancer Maternal Grandmother        breast, pancreatic cancer  . Dementia Maternal Grandfather    Past Surgical History:  Procedure Laterality Date  . BREAST SURGERY     Social History   Social History Narrative  . Not on file   Immunization History   Administered Date(s) Administered  . Hepatitis B, adult 07/21/2014  . Influenza Whole 08/02/2013, 05/17/2014  . Influenza,inj,Quad PF,6+ Mos 05/19/2015, 04/25/2017  . Influenza-Unspecified 08/02/2014  . Meningococcal Polysaccharide 07/21/2014  . Tdap 04/09/2013, 04/18/2015     Objective: Vital Signs: BP 123/85 (BP Location: Left Arm, Patient Position: Sitting, Cuff Size: Large)   Pulse 74   Resp 14   Ht 5\' 1"  (1.549 m)   Wt 235 lb (106.6 kg)   BMI 44.40 kg/m    Physical Exam Vitals signs and nursing note reviewed.  Constitutional:      Appearance: She is well-developed.  HENT:     Head: Normocephalic and atraumatic.  Eyes:     Conjunctiva/sclera: Conjunctivae normal.  Neck:     Musculoskeletal: Normal range of motion.  Cardiovascular:     Rate and Rhythm: Normal rate and regular rhythm.     Heart sounds: Normal heart sounds.  Pulmonary:     Effort: Pulmonary effort is normal.     Breath sounds: Normal breath sounds.  Abdominal:     General: Bowel sounds are normal.     Palpations: Abdomen is soft.  Lymphadenopathy:     Cervical: No cervical adenopathy.  Skin:    General: Skin is warm and dry.     Capillary Refill: Capillary refill takes less than 2 seconds.  Neurological:     Mental Status: She is alert and oriented to person, place, and time.  Psychiatric:        Behavior: Behavior normal.      Musculoskeletal Exam: C-spine slightly limited range of motion with some discomfort.  Thoracic and lumbar spine have good range of motion.  No midline spinal tenderness.  No SI joint tenderness.  Shoulder joints have good range of motion with some discomfort in the right shoulder.  Tenderness over the right AC joint.  Elbow joints, wrist joints, MCPs, PIPs, DIPs good range of motion no synovitis.  She has complete fist formation bilaterally.  Hip joints, knee joints, ankle joints, MTPs, PIPs, DIPs good range of motion no synovitis.  No warmth or effusion of bilateral knee  joints.  No tenderness or swelling of ankle joints.  CDAI Exam: CDAI Score: - Patient Global: -; Provider Global: - Swollen: -; Tender: - Joint Exam   No joint exam has been documented for this visit   There is currently no information documented on the homunculus. Go to the Rheumatology activity and complete the homunculus joint exam.  Investigation: No additional findings.  Imaging: No results found.  Recent Labs: Lab Results  Component Value Date   WBC 6.0 03/10/2019   HGB 11.3 (L) 03/10/2019   PLT 276 03/10/2019   NA 139 03/10/2019   K 4.4 03/10/2019   CL 103 03/10/2019   CO2 28 03/10/2019   GLUCOSE 114 (H) 03/10/2019   BUN 7 03/10/2019   CREATININE 0.66 03/10/2019   BILITOT 0.3 03/10/2019   ALKPHOS 85 04/27/2017   AST 20 03/10/2019   ALT 15 03/10/2019   PROT 7.0 03/10/2019   ALBUMIN 3.9 04/27/2017   CALCIUM 9.4 03/10/2019   GFRAA 128 03/10/2019  Speciality Comments: No specialty comments available.  Procedures:  No procedures performed Allergies: Patient has no known allergies.   Assessment / Plan:     Visit Diagnoses: Chronic right shoulder pain - All autoimmune work-up was negative. X-rays from 03/11/19 were reviewed and discussed with the patient's.  Findings were consistent with: Right acromioclavicular joint space narrowing.  No glenohumeral joint space narrowing.  No chondrocalcinosis noted: She presents today with right shoulder joint pain.  She states that her pain restarted about 1 week ago.  She states she woke up with the discomfort.  She denies any recent injuries.  She has good range of motion on exam today with some discomfort.  She has tenderness over the right AC joint.  We opted out of performing a cortisone injection due to her history of diabetes and uncontrolled hyperglycemia.  She was referred to physical therapy in August but did not go at that time due to her symptoms resolving.  We will reopen this referral.  She was also given a handout of  shoulder exercises to perform.  She was advised to use Voltaren gel topically as needed for pain relief.  De Quervain's tenosynovitis, right: Resolved.  She no tenderness or inflammation on exam today.  Primary osteoarthritis of both knees - bilateral mild: She has good range of motion of bilateral knee joints.  No warmth or effusion was noted.  She has good range of motion with no discomfort at this time.  Arthropathy of lumbar facet joint: She has intermittent discomfort in her lower back, which attributes to the activities she performs at work.   Other medical conditions are listed as follows:   BMI 40.0-44.9, adult ([place])  History of sleep apnea  Sickle cell trait ([place])  Bipolar disorder with psychotic features ([place])  Familial multiple lipoprotein-type hyperlipidemia  Benign essential hypertension  History of iron deficiency anemia  Type 2 diabetes mellitus without complication, without long-term current use of insulin (HCC)-patient states her diabetes is not well controlled and her blood sugar level has been running in 200s.  Vitamin D deficiency  Orders: No orders of the defined types were placed in this encounter.  No orders of the defined types were placed in this encounter.     Follow-Up Instructions: Return in about 6 months (around 12/22/2019) for Osteoarthritis.   Ofilia Neas, PA-C   I examined and evaluated the patient with Hazel Sams PA.  Patient has recurrence of her right shoulder joint pain.  She has discomfort internal rotation.  The x-ray brought by her today was also reviewed which showed acromioclavicular joint arthritis.  No glenohumeral joint space narrowing was noted.  No chondrocalcinosis noted.  We could not inject her shoulder as she is diabetic and her blood sugar has been running high.  Have advised her to monitor blood sugar closely and follow-up with her PCP to control her blood sugar levels.  Will refer her to physical therapy.  The plan of  care was discussed as noted above.  Bo Merino, MD  Note - This record has been created using Editor, commissioning.  Chart creation errors have been sought, but may not always  have been located. Such creation errors do not reflect on  the standard of medical care.

## 2019-06-23 ENCOUNTER — Ambulatory Visit: Payer: No Typology Code available for payment source | Admitting: Rheumatology

## 2019-06-23 ENCOUNTER — Other Ambulatory Visit: Payer: Self-pay

## 2019-06-23 ENCOUNTER — Encounter: Payer: Self-pay | Admitting: Rheumatology

## 2019-06-23 VITALS — BP 123/85 | HR 74 | Resp 14 | Ht 61.0 in | Wt 235.0 lb

## 2019-06-23 DIAGNOSIS — E7849 Other hyperlipidemia: Secondary | ICD-10-CM

## 2019-06-23 DIAGNOSIS — Z6841 Body Mass Index (BMI) 40.0 and over, adult: Secondary | ICD-10-CM

## 2019-06-23 DIAGNOSIS — F319 Bipolar disorder, unspecified: Secondary | ICD-10-CM

## 2019-06-23 DIAGNOSIS — M170 Bilateral primary osteoarthritis of knee: Secondary | ICD-10-CM | POA: Diagnosis not present

## 2019-06-23 DIAGNOSIS — M25511 Pain in right shoulder: Secondary | ICD-10-CM

## 2019-06-23 DIAGNOSIS — M47816 Spondylosis without myelopathy or radiculopathy, lumbar region: Secondary | ICD-10-CM | POA: Diagnosis not present

## 2019-06-23 DIAGNOSIS — E559 Vitamin D deficiency, unspecified: Secondary | ICD-10-CM

## 2019-06-23 DIAGNOSIS — Z8669 Personal history of other diseases of the nervous system and sense organs: Secondary | ICD-10-CM

## 2019-06-23 DIAGNOSIS — I10 Essential (primary) hypertension: Secondary | ICD-10-CM

## 2019-06-23 DIAGNOSIS — M654 Radial styloid tenosynovitis [de Quervain]: Secondary | ICD-10-CM

## 2019-06-23 DIAGNOSIS — D573 Sickle-cell trait: Secondary | ICD-10-CM

## 2019-06-23 DIAGNOSIS — G8929 Other chronic pain: Secondary | ICD-10-CM

## 2019-06-23 DIAGNOSIS — E119 Type 2 diabetes mellitus without complications: Secondary | ICD-10-CM

## 2019-06-23 DIAGNOSIS — Z862 Personal history of diseases of the blood and blood-forming organs and certain disorders involving the immune mechanism: Secondary | ICD-10-CM

## 2019-06-23 NOTE — Patient Instructions (Signed)
You can use voltaren gel topically as needed for pain relief    Shoulder Exercises Ask your health care provider which exercises are safe for you. Do exercises exactly as told by your health care provider and adjust them as directed. It is normal to feel mild stretching, pulling, tightness, or discomfort as you do these exercises. Stop right away if you feel sudden pain or your pain gets worse. Do not begin these exercises until told by your health care provider. Stretching exercises External rotation and abduction This exercise is sometimes called corner stretch. This exercise rotates your arm outward (external rotation) and moves your arm out from your body (abduction). 1. Stand in a doorway with one of your feet slightly in front of the other. This is called a staggered stance. If you cannot reach your forearms to the door frame, stand facing a corner of a room. 2. Choose one of the following positions as told by your health care provider: ? Place your hands and forearms on the door frame above your head. ? Place your hands and forearms on the door frame at the height of your head. ? Place your hands on the door frame at the height of your elbows. 3. Slowly move your weight onto your front foot until you feel a stretch across your chest and in the front of your shoulders. Keep your head and chest upright and keep your abdominal muscles tight. 4. Hold for __________ seconds. 5. To release the stretch, shift your weight to your back foot. Repeat __________ times. Complete this exercise __________ times a day. Extension, standing 1. Stand and hold a broomstick, a cane, or a similar object behind your back. ? Your hands should be a little wider than shoulder width apart. ? Your palms should face away from your back. 2. Keeping your elbows straight and your shoulder muscles relaxed, move the stick away from your body until you feel a stretch in your shoulders (extension). ? Avoid shrugging your  shoulders while you move the stick. Keep your shoulder blades tucked down toward the middle of your back. 3. Hold for __________ seconds. 4. Slowly return to the starting position. Repeat __________ times. Complete this exercise __________ times a day. Range-of-motion exercises Pendulum  1. Stand near a wall or a surface that you can hold onto for balance. 2. Bend at the waist and let your left / right arm hang straight down. Use your other arm to support you. Keep your back straight and do not lock your knees. 3. Relax your left / right arm and shoulder muscles, and move your hips and your trunk so your left / right arm swings freely. Your arm should swing because of the motion of your body, not because you are using your arm or shoulder muscles. 4. Keep moving your hips and trunk so your arm swings in the following directions, as told by your health care provider: ? Side to side. ? Forward and backward. ? In clockwise and counterclockwise circles. 5. Continue each motion for __________ seconds, or for as long as told by your health care provider. 6. Slowly return to the starting position. Repeat __________ times. Complete this exercise __________ times a day. Shoulder flexion, standing  1. Stand and hold a broomstick, a cane, or a similar object. Place your hands a little more than shoulder width apart on the object. Your left / right hand should be palm up, and your other hand should be palm down. 2. Keep your elbow straight  and your shoulder muscles relaxed. Push the stick up with your healthy arm to raise your left / right arm in front of your body, and then over your head until you feel a stretch in your shoulder (flexion). ? Avoid shrugging your shoulder while you raise your arm. Keep your shoulder blade tucked down toward the middle of your back. 3. Hold for __________ seconds. 4. Slowly return to the starting position. Repeat __________ times. Complete this exercise __________ times  a day. Shoulder abduction, standing 1. Stand and hold a broomstick, a cane, or a similar object. Place your hands a little more than shoulder width apart on the object. Your left / right hand should be palm up, and your other hand should be palm down. 2. Keep your elbow straight and your shoulder muscles relaxed. Push the object across your body toward your left / right side. Raise your left / right arm to the side of your body (abduction) until you feel a stretch in your shoulder. ? Do not raise your arm above shoulder height unless your health care provider tells you to do that. ? If directed, raise your arm over your head. ? Avoid shrugging your shoulder while you raise your arm. Keep your shoulder blade tucked down toward the middle of your back. 3. Hold for __________ seconds. 4. Slowly return to the starting position. Repeat __________ times. Complete this exercise __________ times a day. Internal rotation  1. Place your left / right hand behind your back, palm up. 2. Use your other hand to dangle an exercise band, a towel, or a similar object over your shoulder. Grasp the band with your left / right hand so you are holding on to both ends. 3. Gently pull up on the band until you feel a stretch in the front of your left / right shoulder. The movement of your arm toward the center of your body is called internal rotation. ? Avoid shrugging your shoulder while you raise your arm. Keep your shoulder blade tucked down toward the middle of your back. 4. Hold for __________ seconds. 5. Release the stretch by letting go of the band and lowering your hands. Repeat __________ times. Complete this exercise __________ times a day. Strengthening exercises External rotation  1. Sit in a stable chair without armrests. 2. Secure an exercise band to a stable object at elbow height on your left / right side. 3. Place a soft object, such as a folded towel or a small pillow, between your left / right  upper arm and your body to move your elbow about 4 inches (10 cm) away from your side. 4. Hold the end of the exercise band so it is tight and there is no slack. 5. Keeping your elbow pressed against the soft object, slowly move your forearm out, away from your abdomen (external rotation). Keep your body steady so only your forearm moves. 6. Hold for __________ seconds. 7. Slowly return to the starting position. Repeat __________ times. Complete this exercise __________ times a day. Shoulder abduction  1. Sit in a stable chair without armrests, or stand up. 2. Hold a __________ weight in your left / right hand, or hold an exercise band with both hands. 3. Start with your arms straight down and your left / right palm facing in, toward your body. 4. Slowly lift your left / right hand out to your side (abduction). Do not lift your hand above shoulder height unless your health care provider tells you that this  is safe. ? Keep your arms straight. ? Avoid shrugging your shoulder while you do this movement. Keep your shoulder blade tucked down toward the middle of your back. 5. Hold for __________ seconds. 6. Slowly lower your arm, and return to the starting position. Repeat __________ times. Complete this exercise __________ times a day. Shoulder extension 1. Sit in a stable chair without armrests, or stand up. 2. Secure an exercise band to a stable object in front of you so it is at shoulder height. 3. Hold one end of the exercise band in each hand. Your palms should face each other. 4. Straighten your elbows and lift your hands up to shoulder height. 5. Step back, away from the secured end of the exercise band, until the band is tight and there is no slack. 6. Squeeze your shoulder blades together as you pull your hands down to the sides of your thighs (extension). Stop when your hands are straight down by your sides. Do not let your hands go behind your body. 7. Hold for __________ seconds. 8.  Slowly return to the starting position. Repeat __________ times. Complete this exercise __________ times a day. Shoulder row 1. Sit in a stable chair without armrests, or stand up. 2. Secure an exercise band to a stable object in front of you so it is at waist height. 3. Hold one end of the exercise band in each hand. Position your palms so that your thumbs are facing the ceiling (neutral position). 4. Bend each of your elbows to a 90-degree angle (right angle) and keep your upper arms at your sides. 5. Step back until the band is tight and there is no slack. 6. Slowly pull your elbows back behind you. 7. Hold for __________ seconds. 8. Slowly return to the starting position. Repeat __________ times. Complete this exercise __________ times a day. Shoulder press-ups  1. Sit in a stable chair that has armrests. Sit upright, with your feet flat on the floor. 2. Put your hands on the armrests so your elbows are bent and your fingers are pointing forward. Your hands should be about even with the sides of your body. 3. Push down on the armrests and use your arms to lift yourself off the chair. Straighten your elbows and lift yourself up as much as you comfortably can. ? Move your shoulder blades down, and avoid letting your shoulders move up toward your ears. ? Keep your feet on the ground. As you get stronger, your feet should support less of your body weight as you lift yourself up. 4. Hold for __________ seconds. 5. Slowly lower yourself back into the chair. Repeat __________ times. Complete this exercise __________ times a day. Wall push-ups  1. Stand so you are facing a stable wall. Your feet should be about one arm-length away from the wall. 2. Lean forward and place your palms on the wall at shoulder height. 3. Keep your feet flat on the floor as you bend your elbows and lean forward toward the wall. 4. Hold for __________ seconds. 5. Straighten your elbows to push yourself back to the  starting position. Repeat __________ times. Complete this exercise __________ times a day. This information is not intended to replace advice given to you by your health care provider. Make sure you discuss any questions you have with your health care provider. Document Released: 07/03/2005 Document Revised: 12/11/2018 Document Reviewed: 09/18/2018 Elsevier Patient Education  2020 [employer].

## 2019-06-25 ENCOUNTER — Other Ambulatory Visit: Payer: Self-pay | Admitting: Family Medicine

## 2019-06-25 DIAGNOSIS — Z1231 Encounter for screening mammogram for malignant neoplasm of breast: Secondary | ICD-10-CM

## 2019-06-28 ENCOUNTER — Encounter: Payer: Self-pay | Admitting: Family Medicine

## 2019-07-12 ENCOUNTER — Ambulatory Visit
Admission: RE | Admit: 2019-07-12 | Discharge: 2019-07-12 | Disposition: A | Discharge status: Home / Self Care | Payer: No Typology Code available for payment source | Source: Ambulatory Visit | Attending: Family Medicine | Admitting: Family Medicine

## 2019-07-12 ENCOUNTER — Other Ambulatory Visit: Payer: Self-pay

## 2019-07-12 DIAGNOSIS — Z1231 Encounter for screening mammogram for malignant neoplasm of breast: Secondary | ICD-10-CM

## 2019-07-14 ENCOUNTER — Other Ambulatory Visit: Payer: Self-pay | Admitting: Women's Health

## 2019-07-14 ENCOUNTER — Other Ambulatory Visit: Payer: Self-pay | Admitting: Family Medicine

## 2019-07-14 DIAGNOSIS — R928 Other abnormal and inconclusive findings on diagnostic imaging of breast: Secondary | ICD-10-CM

## 2019-07-14 MED FILL — FUSION PLUS CAPSULE: 30 days supply | Qty: 30 | Fill #2

## 2019-07-16 ENCOUNTER — Other Ambulatory Visit: Payer: Self-pay

## 2019-07-16 ENCOUNTER — Ambulatory Visit
Admission: RE | Admit: 2019-07-16 | Discharge: 2019-07-16 | Disposition: A | Discharge status: Home / Self Care | Payer: No Typology Code available for payment source | Source: Ambulatory Visit | Attending: Family Medicine | Admitting: Family Medicine

## 2019-07-16 DIAGNOSIS — R928 Other abnormal and inconclusive findings on diagnostic imaging of breast: Secondary | ICD-10-CM

## 2019-07-19 MED FILL — LABETALOL HCL 200 MG TABS: 200 | 30 days supply | Qty: 60 | Fill #0

## 2019-08-03 ENCOUNTER — Other Ambulatory Visit: Payer: Self-pay

## 2019-08-03 ENCOUNTER — Ambulatory Visit: Payer: No Typology Code available for payment source | Attending: Rheumatology

## 2019-08-03 DIAGNOSIS — M542 Cervicalgia: Secondary | ICD-10-CM | POA: Diagnosis present

## 2019-08-03 DIAGNOSIS — G8929 Other chronic pain: Secondary | ICD-10-CM

## 2019-08-03 DIAGNOSIS — M436 Torticollis: Secondary | ICD-10-CM | POA: Diagnosis present

## 2019-08-03 DIAGNOSIS — M25511 Pain in right shoulder: Secondary | ICD-10-CM | POA: Diagnosis present

## 2019-08-03 NOTE — Therapy (Signed)
[facility] [address] [place], [place], [address] Phone: [phone]   Fax:  [phone]  Physical Therapy Evaluation  Patient Details  Name: Kaylee Bruinsundrea Diers MRN: [id number] Date of Birth: Jan 23, 1981 Referring Provider (PT): Pollyann SavoyShaili  Deveshwar, MD   Encounter Date: 08/03/2019  PT End of Session - 08/03/19 0926    Visit Number  1    Number of Visits  11    Date for PT Re-Evaluation  09/10/19    Authorization Type  [employer]    PT Start Time  [phone]   PT late   PT Stop Time  1000    PT Time Calculation (min)  33 min    Activity Tolerance  Patient tolerated treatment well    Behavior During Therapy  [facility] for tasks assessed/performed       Past Medical History:  Diagnosis Date  . Anxiety   . Bipolar 1 disorder (HCC)   . Depression   . Gestational diabetes   . Hypertension     Past Surgical History:  Procedure Laterality Date  . BREAST SURGERY      There were no vitals filed for this visit.   Subjective Assessment - 08/03/19 0929    Subjective  She reports right shoulder issues  woke with RTR shoulder pain.   no injury.  [facility]    Shoulder was locked  with pain.   Did some exercises and still lock up at times    Currently in Pain?  Yes    Pain Score  2     Pain Location  Shoulder    Pain Orientation  Right;Anterior   whole shoulder at times   Pain Descriptors / Indicators  Dull;Aching    Pain Type  Chronic pain    Pain Onset  More than a month ago    Pain Frequency  Intermittent    Aggravating Factors   different positions and movements    Pain Relieving Factors  at rest         [facility] - 08/03/19 0001      Assessment   Medical Diagnosis  chronic RT shoulder pain.     Referring Provider (PT)  Pollyann SavoyShaili  Deveshwar, MD    Onset Date/Surgical Date  --   3 month sor more   Hand Dominance  Right    Next MD Visit  10/2019    Prior Therapy  No      Precautions   Precautions  None       Restrictions   Weight Bearing Restrictions  No      Balance Screen   Has the patient fallen in the past 6 months  Yes    How many times?  1   tripped   Has the patient had a decrease in activity level because of a fear of falling?   No    Is the patient reluctant to leave their home because of a fear of falling?   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      Cognition   Overall Cognitive Status  Within Functional Limits for tasks assessed      ROM / Strength   AROM / PROM / Strength  AROM;PROM;Strength      AROM   AROM Assessment Site  Shoulder;Cervical  Right/Left Shoulder  Right;Left    Right Shoulder Extension  55 Degrees    Right Shoulder Flexion  180 Degrees    Right Shoulder ABduction  180 Degrees    Right Shoulder Internal Rotation  --   able to reach up back 1 inch less than RT    Right Shoulder External Rotation  93 Degrees    Left Shoulder Extension  55 Degrees    Left Shoulder Flexion  175 Degrees    Left Shoulder ABduction  180 Degrees    Left Shoulder External Rotation  93 Degrees    Cervical Flexion  50    Cervical Extension  40    Cervical - Right Side Bend  40    Cervical - Left Side Bend  23    Cervical - Right Rotation  55    Cervical - Left Rotation  50      Strength   Overall Strength Comments  5/5 RT and LT shoulder    Strength Assessment Site  Shoulder    Right/Left Shoulder  Right;Left    Right Shoulder Flexion  5/5    Right Shoulder Extension  5/5    Right Shoulder ABduction  5/5    Right Shoulder Internal Rotation  5/5    Right Shoulder External Rotation  5/5    Right Shoulder Horizontal ABduction  5/5    Right Shoulder Horizontal ADduction  5/5    Left Shoulder Flexion  5/5    Left Shoulder Extension  5/5    Left Shoulder ABduction  5/5    Left Shoulder Internal Rotation  5/5    Left Shoulder External Rotation  5/5    Left Shoulder  Horizontal ABduction  5/5    Left Shoulder Horizontal ADduction  5/5      Palpation   Palpation comment  tender RT trap ,RT paraspinals scalenes and levator RT                 Objective measurements completed on examination: See above findings.              PT Education - 08/03/19 0959    Education Details  POC , findings, gentle ROM of neck avoid pain.    Person(s) Educated  Patient    Methods  Explanation;Demonstration    Comprehension  Verbalized understanding;Returned demonstration       PT Short Term Goals - 08/03/19 1013      PT SHORT TERM GOAL #1   Title  She will be indpendent with initial HEP    Time  2    Period  Weeks    Status  New        PT Long Term Goals - 08/03/19 1013      PT LONG TERM GOAL #1   Title  She will be indepenent with all hEP issued    Time  5    Period  Weeks    Status  New      PT LONG TERM GOAL #2   Title  She wil be able to move head/ neck through full ROM with 1-2 max pain or less    Time  5    Period  Weeks    Status  New      PT LONG TERM GOAL #3   Title  She will report full use of RT arm without pain    Time  5    Period  Weeks    Status  New  PT LONG TERM GOAL #5   Title  FOTO decreased to 30% limited or better    Time  5    Period  Weeks    Status  New             Plan - 08/03/19 0959    Clinical Impression Statement  Ms Samad presetns with complaint of RT arm/shoulder pain but on exam her neck movements appear to elicite pain in reported areas of pain and shoulder movemetn do not . Shoulder  ROM is normal and strength is normal.  She should improve with skilled PT and consistent HEP    Examination-Activity Limitations  Lift;Reach Overhead;Carry    Examination-Participation Restrictions  --   work and home tasks with RT arm   Stability/Clinical Decision Making  Stable/Uncomplicated    Clinical Decision Making  Low    Rehab Potential  Good    PT Frequency  2x / week    PT Duration  6  weeks    PT Treatment/Interventions  Electrical Stimulation;Iontophoresis 4mg /ml Dexamethasone;Moist Heat;Ultrasound;Therapeutic exercise;Manual techniques;Taping;Dry needling;Passive range of motion;Traction    PT Next Visit Plan  traction and manual, gentle stretching, HEP    PT Home Exercise Plan  gentle cervical ROM    Consulted and Agree with Plan of Care  Patient       Patient will benefit from skilled therapeutic intervention in order to improve the following deficits and impairments:  Pain, Increased muscle spasms, Decreased activity tolerance, Decreased range of motion  Visit Diagnosis: Chronic right shoulder pain  Cervicalgia  Stiffness of neck     Problem List Patient Active Problem List   Diagnosis Date Noted  . Arthropathy of lumbar facet joint 06/01/2019  . Primary osteoarthritis of both knees 06/01/2019  . Vitamin D deficiency 08/12/2018  . Iron deficiency anemia, unspecified 08/07/2018  . Class 2 obesity with body mass index (BMI) of 39.0 to 39.9 in adult 05/20/2018  . Type 2 diabetes mellitus (HCC) 05/01/2017  . Bipolar disorder with psychotic features (HCC) 04/28/2017  . Sickle cell trait (HCC) 12/09/2014  . Benign essential hypertension 04/09/2013  . Familial multiple lipoprotein-type hyperlipidemia 04/09/2013    06/09/2013  PT 08/03/2019, 10:52 AM  [facility] [address] [place], [place], [place] Phone: [phone]   Fax:  [phone]  Name: Kaylee Hill MRN: Kaylee Hill Date of Birth: 1981/04/03

## 2019-08-09 ENCOUNTER — Other Ambulatory Visit: Payer: Self-pay

## 2019-08-09 ENCOUNTER — Ambulatory Visit: Payer: No Typology Code available for payment source

## 2019-08-09 DIAGNOSIS — M25511 Pain in right shoulder: Secondary | ICD-10-CM | POA: Diagnosis not present

## 2019-08-09 DIAGNOSIS — M436 Torticollis: Secondary | ICD-10-CM

## 2019-08-09 DIAGNOSIS — M542 Cervicalgia: Secondary | ICD-10-CM

## 2019-08-09 NOTE — Therapy (Signed)
[place], [place], [address] Phone: [phone]   Fax:  [phone]  Physical Therapy Treatment  Patient Details  Name: Kaylee Hill MRN: [id number] Date of Birth: 01-27-1981 Referring Provider (PT): Bo Merino, MD   Encounter Date: 08/09/2019  PT End of Session - 08/09/19 0901    Visit Number  2    Number of Visits  11    Date for PT Re-Evaluation  09/10/19    Authorization Type  [employer]    PT Start Time  [phone]    PT Stop Time  0915    PT Time Calculation (min)  40 min    Activity Tolerance  Patient tolerated treatment well    Behavior During Therapy  [facility] for tasks assessed/performed       Past Medical History:  Diagnosis Date  . Anxiety   . Bipolar 1 disorder ([place])   . Depression   . Gestational diabetes   . Hypertension     Past Surgical History:  Procedure Laterality Date  . BREAST SURGERY      There were no vitals filed for this visit.  Subjective Assessment - 08/09/19 0836    Subjective  No changes.    Pain Score  1     Pain Location  Neck   shoulder   Pain Orientation  Right;Anterior    Pain Descriptors / Indicators  Aching    Pain Type  Chronic pain    Pain Onset  More than a month ago    Pain Frequency  Intermittent                       [facility] Adult PT Treatment/Exercise - 08/09/19 0001      Exercises   Exercises  Neck      Modalities   Modalities  Moist Heat;Ultrasound      Moist Heat Therapy   Number Minutes Moist Heat  12 Minutes    Moist Heat Location  Cervical      Ultrasound   Ultrasound Location  RT neck    Ultrasound Parameters  100% 1.6 Wcm2 1 MHz    Ultrasound Goals  Pain      Manual Therapy   Manual Therapy  Joint mobilization;Soft tissue mobilization;Manual Traction    Joint Mobilization  Gr 2-3 PA glides T2 to C1    Soft tissue mobilization  RT paraspinals and shoulder    Manual Traction  cervcial                PT Short Term Goals - 08/03/19 1013      PT SHORT TERM GOAL #1   Title  She will be indpendent with initial HEP    Time  2    Period  Weeks    Status  New        PT Long Term Goals - 08/03/19 1013      PT LONG TERM GOAL #1   Title  She will be indepenent with all hEP issued    Time  5    Period  Weeks    Status  New      PT LONG TERM GOAL #2   Title  She wil be able to move head/ neck through full ROM with 1-2 max pain or less    Time  5    Period  Weeks    Status  New      PT LONG TERM GOAL #  3   Title  She will report full use of RT arm without pain    Time  5    Period  Weeks    Status  New      PT LONG TERM GOAL #5   Title  FOTO decreased to 30% limited or better    Time  5    Period  Weeks    Status  New            Plan - 08/09/19 0902    Clinical Impression Statement  Passive treatment today  to see if we can decr pain overall. If she does well without incr pain  will add gentle band exercises for RT shoulder next session.    PT Treatment/Interventions  Electrical Stimulation;Iontophoresis 4mg /ml Dexamethasone;Moist Heat;Ultrasound;Therapeutic exercise;Manual techniques;Taping;Dry needling;Passive range of motion;Traction    PT Next Visit Plan  traction and manual /modalites gentle stretching, HEP    PT Home Exercise Plan  gentle cervical ROM    Consulted and Agree with Plan of Care  Patient       Patient will benefit from skilled therapeutic intervention in order to improve the following deficits and impairments:  Pain, Increased muscle spasms, Decreased activity tolerance, Decreased range of motion  Visit Diagnosis: Cervicalgia  Chronic right shoulder pain  Stiffness of neck     Problem List Patient Active Problem List   Diagnosis Date Noted  . Arthropathy of lumbar facet joint 06/01/2019  . Primary osteoarthritis of both knees 06/01/2019  . Vitamin D deficiency 08/12/2018  . Iron deficiency anemia, unspecified  08/07/2018  . Class 2 obesity with body mass index (BMI) of 39.0 to 39.9 in adult 05/20/2018  . Type 2 diabetes mellitus (HCC) 05/01/2017  . Bipolar disorder with psychotic features (HCC) 04/28/2017  . Sickle cell trait (HCC) 12/09/2014  . Benign essential hypertension 04/09/2013  . Familial multiple lipoprotein-type hyperlipidemia 04/09/2013    06/09/2013  PT 08/09/2019, 9:06 AM  [facility] [address] [place], [place], [place] Phone: [phone]   Fax:  [phone]  Name: Kaylee Hill MRN: Marcelyn Bruins Date of Birth: Jan 22, 1981

## 2019-08-11 ENCOUNTER — Ambulatory Visit: Payer: No Typology Code available for payment source | Admitting: Physical Therapy

## 2019-08-13 ENCOUNTER — Other Ambulatory Visit: Payer: Self-pay

## 2019-08-13 ENCOUNTER — Encounter: Payer: Self-pay | Admitting: Physical Therapy

## 2019-08-13 ENCOUNTER — Ambulatory Visit: Payer: No Typology Code available for payment source

## 2019-08-13 ENCOUNTER — Ambulatory Visit: Payer: No Typology Code available for payment source | Admitting: Physical Therapy

## 2019-08-13 DIAGNOSIS — M436 Torticollis: Secondary | ICD-10-CM

## 2019-08-13 DIAGNOSIS — G8929 Other chronic pain: Secondary | ICD-10-CM

## 2019-08-13 DIAGNOSIS — M25511 Pain in right shoulder: Secondary | ICD-10-CM | POA: Diagnosis not present

## 2019-08-13 DIAGNOSIS — M542 Cervicalgia: Secondary | ICD-10-CM

## 2019-08-13 NOTE — Therapy (Signed)
[place], [place], [address] Phone: [phone]   Fax:  [phone]  Physical Therapy Treatment  Patient Details  Name: Kaylee Hill MRN: [id number] Date of Birth: [birth year] Referring Provider (PT): Bo Merino, MD   Encounter Date: 08/13/2019  PT End of Session - 08/13/19 1251    Visit Number  3    Number of Visits  11    Date for PT Re-Evaluation  09/10/19    Authorization Type  [employer]    PT Start Time  7673    PT Stop Time  1230    PT Time Calculation (min)  45 min       Past Medical History:  Diagnosis Date  . Anxiety   . Bipolar 1 disorder ([place])   . Depression   . Gestational diabetes   . Hypertension     Past Surgical History:  Procedure Laterality Date  . BREAST SURGERY      There were no vitals filed for this visit.  Subjective Assessment - 08/13/19 1148    Subjective  Felt good after last session until I turned my head quickly and got a spasm. I have been stretching my neck.    Currently in Pain?  Yes    Pain Score  1     Pain Location  Neck    Pain Orientation  Right    Pain Descriptors / Indicators  Aching    Aggravating Factors   quick neck movements, prolonged positions and then moving shoulder causes pain    Pain Relieving Factors  at rest                       [facility] Adult PT Treatment/Exercise - 08/13/19 0001      Self-Care   Self-Care  Other Self-Care Comments    Other Self-Care Comments   Pt instructed in use of Tennis Ball or theracane for self Trigger point release       Neck Exercises: Theraband   Rows  15 reps    Rows Limitations  red, cues for technique, depressed shoulders    Shoulder External Rotation  15 reps    Shoulder External Rotation Limitations  red, cues for technique and deptressed shoulder      Ultrasound   Ultrasound Location  Rt upper trap    Ultrasound Parameters  100% 1.6 w/cm2 x 8 min    Ultrasound Goals  Pain       Manual Therapy   Soft tissue mobilization  Trigger point release Right upper trap , soft tissue work       Neck Exercises: Stretches   Upper Trapezius Stretch  --   AROM   Other Neck Stretches  scap squeeze and shoulder rolls              PT Education - 08/13/19 1251    Education Details  HEP    Person(s) Educated  Patient    Methods  Explanation;Handout    Comprehension  Verbalized understanding       PT Short Term Goals - 08/03/19 1013      PT SHORT TERM GOAL #1   Title  She will be indpendent with initial HEP    Time  2    Period  Weeks    Status  New        PT Long Term Goals - 08/03/19 1013      PT LONG TERM GOAL #1  Title  She will be indepenent with all hEP issued    Time  5    Period  Weeks    Status  New      PT LONG TERM GOAL #2   Title  She wil be able to move head/ neck through full ROM with 1-2 max pain or less    Time  5    Period  Weeks    Status  New      PT LONG TERM GOAL #3   Title  She will report full use of RT arm without pain    Time  5    Period  Weeks    Status  New      PT LONG TERM GOAL #5   Title  FOTO decreased to 30% limited or better    Time  5    Period  Weeks    Status  New            Plan - 08/13/19 1258    Clinical Impression Statement  Pt reports releif after last session until she turned her head quickly. She notes stiffness in shoulder after prolonged positioning. Reviewed HEP and began gentle shoulder bands. No increased pain. Continued with [place] and trigger point release. She felt looser afterward. She will be out of towen next week. Instructed her in use of tennis ball or therecane to work on her upper trap. Updated HEP. She is open to [facility]. Will need to schedule if not better after next week.    PT Next Visit Plan  traction and manual /modalites gentle stretching, HEP    PT Home Exercise Plan  gentle cervical ROM, red band rows and ER right shoulder       Patient will benefit from skilled  therapeutic intervention in order to improve the following deficits and impairments:  Pain, Increased muscle spasms, Decreased activity tolerance, Decreased range of motion  Visit Diagnosis: Cervicalgia  Chronic right shoulder pain  Stiffness of neck     Problem List Patient Active Problem List   Diagnosis Date Noted  . Arthropathy of lumbar facet joint 06/01/2019  . Primary osteoarthritis of both knees 06/01/2019  . Vitamin D deficiency 08/12/2018  . Iron deficiency anemia, unspecified 08/07/2018  . Class 2 obesity with body mass index (BMI) of 39.0 to 39.9 in adult 05/20/2018  . Type 2 diabetes mellitus (HCC) 05/01/2017  . Bipolar disorder with psychotic features (HCC) 04/28/2017  . Sickle cell trait (HCC) 12/09/2014  . Benign essential hypertension 04/09/2013  . Familial multiple lipoprotein-type hyperlipidemia 04/09/2013    Sherrie Mustache, PTA 08/13/2019, 1:01 PM  [facility] [address] [place], [place], [address] Phone: [phone]   Fax:  [phone]  Name: Kaylee Hill MRN: [id number] Date of Birth: June 20, 1981

## 2019-08-16 ENCOUNTER — Ambulatory Visit: Payer: No Typology Code available for payment source

## 2019-08-16 ENCOUNTER — Other Ambulatory Visit: Payer: Self-pay | Admitting: Family Medicine

## 2019-08-18 ENCOUNTER — Ambulatory Visit: Payer: No Typology Code available for payment source | Admitting: Physical Therapy

## 2019-08-18 ENCOUNTER — Encounter: Payer: Self-pay | Admitting: Family Medicine

## 2019-08-18 MED ORDER — METFORMIN HCL 500 MG PO TABS: 500.0000 mg | ORAL_TABLET | Freq: Two times a day (BID) | ORAL | 0 refills | Status: DC

## 2019-08-18 MED FILL — metFORMIN HCL 500 MG TABS: 500 | 90 days supply | Qty: 180 | Fill #0

## 2019-08-24 ENCOUNTER — Ambulatory Visit: Payer: No Typology Code available for payment source

## 2019-08-26 ENCOUNTER — Ambulatory Visit: Payer: No Typology Code available for payment source

## 2019-08-26 ENCOUNTER — Other Ambulatory Visit: Payer: Self-pay

## 2019-08-26 DIAGNOSIS — M25511 Pain in right shoulder: Secondary | ICD-10-CM | POA: Diagnosis not present

## 2019-08-26 DIAGNOSIS — M436 Torticollis: Secondary | ICD-10-CM

## 2019-08-26 DIAGNOSIS — G8929 Other chronic pain: Secondary | ICD-10-CM

## 2019-08-26 DIAGNOSIS — M542 Cervicalgia: Secondary | ICD-10-CM

## 2019-08-26 NOTE — Therapy (Signed)
[place], [place], [address] Phone: [phone]   Fax:  [phone]  Physical Therapy Treatment  Patient Details  Name: Kaylee Hill MRN: [id number] Date of Birth: [birth year] Referring Provider (PT): Bo Merino, MD   Encounter Date: 08/26/2019  PT End of Session - 08/26/19 1212    Visit Number  4    Number of Visits  11    Date for PT Re-Evaluation  09/10/19    Authorization Type  [employer]    PT Start Time  1136    PT Stop Time  1225    PT Time Calculation (min)  49 min    Activity Tolerance  Patient tolerated treatment well    Behavior During Therapy  [facility] for tasks assessed/performed       Past Medical History:  Diagnosis Date  . Anxiety   . Bipolar 1 disorder ([place])   . Depression   . Gestational diabetes   . Hypertension     Past Surgical History:  Procedure Laterality Date  . BREAST SURGERY      There were no vitals filed for this visit.  Subjective Assessment - 08/26/19 1138    Subjective  Feeling better Twng here and ther. Alot better    Pain Score  1     Pain Location  Neck    Pain Orientation  Left;Right    Pain Descriptors / Indicators  Aching    Pain Type  Chronic pain    Pain Onset  More than a month ago    Pain Frequency  Intermittent    Aggravating Factors   quick movements, typing    Pain Relieving Factors  rest                       [facility] Adult PT Treatment/Exercise - 08/26/19 0001      Neck Exercises: Machines for Strengthening   UBE (Upper Arm Bike)  L1   4 min      Neck Exercises: Theraband   Rows  15 reps    Rows Limitations  red, cues for technique, depressed shoulders    Shoulder External Rotation  15 reps    Shoulder External Rotation Limitations  red, cues for technique and deptressed shoulder      Modalities   Modalities  Traction      Moist Heat Therapy   Number Minutes Moist Heat  10 Minutes    Moist Heat Location   Cervical      Ultrasound   Ultrasound Location  RT neck and trap    Ultrasound Parameters  100%  1.6 Wc2 1 Mz    Ultrasound Goals  Pain      Traction   Type of Traction  Cervical    Min (lbs)  5    Max (lbs)  12    Hold Time  60    Rest Time  15    Time  15      Manual Therapy   Joint Mobilization  Gr 2-3 PA glides T2 to C1    Soft tissue mobilization  Trigger point release Right upper trap , soft tissue work     Manual Traction  cervcial               PT Short Term Goals - 08/26/19 1214      PT SHORT TERM GOAL #1   Title  She will be indpendent with initial HEP  Status  Achieved        PT Long Term Goals - 08/26/19 1215      PT LONG TERM GOAL #1   Title  She will be independent with all hEP issued    Status  On-going      PT LONG TERM GOAL #2   Title  She wil be able to move head/ neck through full ROM with 1-2 max pain or less    Status  On-going      PT LONG TERM GOAL #3   Title  She will report full use of RT arm without pain    Baseline  better but sitll pain middle deltoid      PT LONG TERM GOAL #5   Title  FOTO decreased to 30% limited or better    Status  Unable to assess            Plan - 08/26/19 1213    Clinical Impression Statement  Still pain on RT and still incr pain with LT rotation and sidebending but overall  improved . Continue traction if no issues and incr 2 #    PT Treatment/Interventions  Electrical Stimulation;Iontophoresis 4mg /ml Dexamethasone;Moist Heat;Ultrasound;Therapeutic exercise;Manual techniques;Taping;Dry needling;Passive range of motion;Traction    PT Next Visit Plan  traction and manual /modalites gentle stretching, HEP    PT Home Exercise Plan  gentle cervical ROM, red band rows and ER right shoulder       Patient will benefit from skilled therapeutic intervention in order to improve the following deficits and impairments:  Pain, Increased muscle spasms, Decreased activity tolerance, Decreased range of  motion  Visit Diagnosis: Cervicalgia  Chronic right shoulder pain  Stiffness of neck     Problem List Patient Active Problem List   Diagnosis Date Noted  . Arthropathy of lumbar facet joint 06/01/2019  . Primary osteoarthritis of both knees 06/01/2019  . Vitamin D deficiency 08/12/2018  . Iron deficiency anemia, unspecified 08/07/2018  . Class 2 obesity with body mass index (BMI) of 39.0 to 39.9 in adult 05/20/2018  . Type 2 diabetes mellitus (HCC) 05/01/2017  . Bipolar disorder with psychotic features (HCC) 04/28/2017  . Sickle cell trait (HCC) 12/09/2014  . Benign essential hypertension 04/09/2013  . Familial multiple lipoprotein-type hyperlipidemia 04/09/2013    06/09/2013  PT 08/26/2019, 12:16 PM  [facility] [address] [place], [place], [place] Phone: [phone]   Fax:  [phone]  Name: Deshia Vanderhoof MRN: Marcelyn Bruins Date of Birth: 06/28/1981

## 2019-08-31 ENCOUNTER — Ambulatory Visit: Payer: No Typology Code available for payment source | Admitting: Physical Therapy

## 2019-08-31 ENCOUNTER — Encounter: Payer: Self-pay | Admitting: Physical Therapy

## 2019-08-31 ENCOUNTER — Other Ambulatory Visit: Payer: Self-pay

## 2019-08-31 DIAGNOSIS — M25511 Pain in right shoulder: Secondary | ICD-10-CM

## 2019-08-31 DIAGNOSIS — M542 Cervicalgia: Secondary | ICD-10-CM

## 2019-08-31 DIAGNOSIS — M436 Torticollis: Secondary | ICD-10-CM

## 2019-08-31 DIAGNOSIS — G8929 Other chronic pain: Secondary | ICD-10-CM

## 2019-08-31 NOTE — Therapy (Signed)
[place], [place], [address] Phone: [phone]   Fax:  [phone]  Physical Therapy Treatment  Patient Details  Name: Kaylee Hill MRN: [id number] Date of Birth: 05/14/1981 Referring Provider (PT): Bo Merino, MD   Encounter Date: 08/31/2019  PT End of Session - 08/31/19 0941    Visit Number  5    Number of Visits  11    Date for PT Re-Evaluation  09/10/19    Authorization Type  [employer]    PT Start Time  0940   10 minutes late once checked in   PT Stop Time  1034    PT Time Calculation (min)  54 min       Past Medical History:  Diagnosis Date  . Anxiety   . Bipolar 1 disorder ([place])   . Depression   . Gestational diabetes   . Hypertension     Past Surgical History:  Procedure Laterality Date  . BREAST SURGERY      There were no vitals filed for this visit.  Subjective Assessment - 08/31/19 0942    Subjective  I like the [place] and still getting used to the cervical traction. I would like to try TPDN.    Currently in Pain?  Yes    Pain Location  Neck    Pain Orientation  Right    Pain Descriptors / Indicators  Aching    Pain Type  Chronic pain    Aggravating Factors   turning left    Pain Relieving Factors  rest                       [facility] Adult PT Treatment/Exercise - 08/31/19 0001      Neck Exercises: Machines for Strengthening   UBE (Upper Arm Bike)  L1   4 min      Neck Exercises: Theraband   Shoulder Extension  15 reps    Rows  15 reps    Shoulder External Rotation  15 reps      Ultrasound   Ultrasound Location  Rt upper trap    Ultrasound Parameters  1.6 w/cm2 1 mhz     Ultrasound Goals  Pain      Traction   Type of Traction  Cervical    Min (lbs)  7    Max (lbs)  14    Hold Time  60    Rest Time  10    Time  15      Manual Therapy   Soft tissue mobilization  Trigger point release Right upper trap , soft tissue work       Neck Exercises:  Stretches   Upper Trapezius Stretch  2 reps;20 seconds    Levator Stretch  2 reps;20 seconds               PT Short Term Goals - 08/26/19 1214      PT SHORT TERM GOAL #1   Title  She will be indpendent with initial HEP    Status  Achieved        PT Long Term Goals - 08/26/19 1215      PT LONG TERM GOAL #1   Title  She will be independent with all hEP issued    Status  On-going      PT LONG TERM GOAL #2   Title  She wil be able to move head/ neck through full ROM with 1-2 max  pain or less    Status  On-going      PT LONG TERM GOAL #3   Title  She will report full use of RT arm without pain    Baseline  better but sitll pain middle deltoid      PT LONG TERM GOAL #5   Title  FOTO decreased to 30% limited or better    Status  Unable to assess            Plan - 08/31/19 1041    Clinical Impression Statement  Pt reports improvement. She feels knots in upper trap return after treatment. She likes [place]. Unsure about benefit of traction so reapted with increased pull today. She will schedule to have TPDN next week for right upper trp.    PT Next Visit Plan  traction and manual /modalites gentle stretching, HEP, right upper trap TPDN    PT Home Exercise Plan  gentle cervical ROM, red band rows and ER right shoulder       Patient will benefit from skilled therapeutic intervention in order to improve the following deficits and impairments:  Pain, Increased muscle spasms, Decreased activity tolerance, Decreased range of motion  Visit Diagnosis: Cervicalgia  Chronic right shoulder pain  Stiffness of neck     Problem List Patient Active Problem List   Diagnosis Date Noted  . Arthropathy of lumbar facet joint 06/01/2019  . Primary osteoarthritis of both knees 06/01/2019  . Vitamin D deficiency 08/12/2018  . Iron deficiency anemia, unspecified 08/07/2018  . Class 2 obesity with body mass index (BMI) of 39.0 to 39.9 in adult 05/20/2018  . Type 2 diabetes  mellitus (HCC) 05/01/2017  . Bipolar disorder with psychotic features (HCC) 04/28/2017  . Sickle cell trait (HCC) 12/09/2014  . Benign essential hypertension 04/09/2013  . Familial multiple lipoprotein-type hyperlipidemia 04/09/2013    Sherrie Mustache, PTA 08/31/2019, 10:44 AM  [facility] [address] [place], [place], [address] Phone: [phone]   Fax:  [phone]  Name: Kaylee Hill MRN: [id number] Date of Birth: 09/04/1980

## 2019-09-02 ENCOUNTER — Ambulatory Visit: Payer: No Typology Code available for payment source | Admitting: Physical Therapy

## 2019-09-16 ENCOUNTER — Other Ambulatory Visit: Payer: Self-pay

## 2019-09-16 ENCOUNTER — Ambulatory Visit: Payer: No Typology Code available for payment source | Attending: Rheumatology | Admitting: Physical Therapy

## 2019-09-16 ENCOUNTER — Encounter: Payer: Self-pay | Admitting: Physical Therapy

## 2019-09-16 DIAGNOSIS — G8929 Other chronic pain: Secondary | ICD-10-CM | POA: Diagnosis present

## 2019-09-16 DIAGNOSIS — M25511 Pain in right shoulder: Secondary | ICD-10-CM | POA: Insufficient documentation

## 2019-09-16 DIAGNOSIS — M542 Cervicalgia: Secondary | ICD-10-CM | POA: Diagnosis not present

## 2019-09-16 DIAGNOSIS — M436 Torticollis: Secondary | ICD-10-CM | POA: Insufficient documentation

## 2019-09-16 NOTE — Patient Instructions (Signed)
Trigger Point Dry Needling  . What is Trigger Point Dry Needling (DN)? o DN is a physical therapy technique used to treat muscle pain and dysfunction. Specifically, DN helps deactivate muscle trigger points (muscle knots).  o A thin filiform needle is used to penetrate the skin and stimulate the underlying trigger point. The goal is for a local twitch response (LTR) to occur and for the trigger point to relax. No medication of any kind is injected during the procedure.   . What Does Trigger Point Dry Needling Feel Like?  o The procedure feels different for each individual patient. Some patients report that they do not actually feel the needle enter the skin and overall the process is not painful. Very mild bleeding may occur. However, many patients feel a deep cramping in the muscle in which the needle was inserted. This is the local twitch response.   Marland Kitchen How Will I feel after the treatment? o Soreness is normal, and the onset of soreness may not occur for a few hours. Typically this soreness does not last longer than two days.  o Bruising is uncommon, however; ice can be used to decrease any possible bruising.  o In rare cases feeling tired or nauseous after the treatment is normal. In addition, your symptoms may get worse before they get better, this period will typically not last longer than 24 hours.   . What Can I do After My Treatment? o Increase your hydration by drinking more water for the next 24 hours. o You may place ice or heat on the areas treated that have become sore, however, do not use heat on inflamed or bruised areas. Heat often brings more relief post needling. o You can continue your regular activities, but vigorous activity is not recommended initially after the treatment for 24 hours. o DN is best combined with other physical therapy such as strengthening, stretching, and other therapies.    Garen Lah, PT Certified Exercise Expert for the Aging Adult  09/16/19  10:27 AM Phone: [phone] Fax: [phone]

## 2019-09-16 NOTE — Therapy (Addendum)
[place], [place], [address] Phone: [phone]   Fax:  [phone]  Physical Therapy Treatment/Re evaluation/Discharge  Patient Details  Name: Kaylee Hill MRN: [id number] Date of Birth: [birth year] Referring Provider (PT): Bo Merino, MD   Encounter Date: 09/16/2019  PT End of Session - 09/16/19 1018    Visit Number  6    Number of Visits  13    Date for PT Re-Evaluation  11/11/19    Authorization Type  [employer]    PT Start Time  1017    PT Stop Time  1112    PT Time Calculation (min)  55 min    Activity Tolerance  Patient tolerated treatment well    Behavior During Therapy  [facility] for tasks assessed/performed       Past Medical History:  Diagnosis Date  . Anxiety   . Bipolar 1 disorder ([place])   . Depression   . Gestational diabetes   . Hypertension     Past Surgical History:  Procedure Laterality Date  . BREAST SURGERY      There were no vitals filed for this visit.  Subjective Assessment - 09/16/19 1023    Subjective  My right side is hurting. I had a terrible night of sleep last night.    Patient Stated Goals  Try to relieve last bit of pain in my right neck    Currently in Pain?  Yes    Pain Score  2     Pain Location  Neck    Pain Orientation  Right    Pain Descriptors / Indicators  Aching;Dull    Pain Type  Chronic pain    Pain Onset  More than a month ago    Pain Frequency  Intermittent    Aggravating Factors   minimal pain when turning my neck         [facility] PT Assessment - 09/16/19 0001      Assessment   Medical Diagnosis  chronic RT shoulder pain.     Referring Provider (PT)  Bo Merino, MD    Onset Date/Surgical Date  --   about 4 months ago   Hand Dominance  Right    Next MD Visit  10/2019    Prior Therapy  No      Observation/Other Assessments   Focus on Therapeutic Outcomes (FOTO)   INtake 74% limitation 26% predicted 31%      ROM / Strength   AROM / PROM / Strength  AROM;PROM;Strength      AROM   AROM Assessment Site  Shoulder;Cervical    Right/Left Shoulder  Right;Left    Right Shoulder Extension  55 Degrees    Right Shoulder Flexion  180 Degrees    Right Shoulder ABduction  180 Degrees    Right Shoulder Internal Rotation  --   T-8   Right Shoulder External Rotation  93 Degrees    Left Shoulder Extension  55 Degrees    Left Shoulder Flexion  180 Degrees    Left Shoulder ABduction  180 Degrees    Left Shoulder Internal Rotation  --   T-6   Left Shoulder External Rotation  93 Degrees    Cervical Flexion  50    Cervical Extension  45    Cervical - Right Side Bend  44    Cervical - Left Side Bend  30    Cervical - Right Rotation  55    Cervical -  Left Rotation  45      Strength   Overall Strength Comments  5/5 RT and LT shoulder    Right Shoulder Flexion  5/5    Right Shoulder Extension  5/5    Right Shoulder ABduction  5/5    Right Shoulder Internal Rotation  5/5    Right Shoulder External Rotation  5/5    Right Shoulder Horizontal ABduction  5/5    Right Shoulder Horizontal ADduction  5/5    Left Shoulder Flexion  5/5    Left Shoulder Extension  5/5    Left Shoulder ABduction  5/5    Left Shoulder Internal Rotation  5/5    Left Shoulder External Rotation  5/5    Left Shoulder Horizontal ABduction  5/5    Left Shoulder Horizontal ADduction  5/5      Palpation   Palpation comment  TTP to RT trap, levator, RT paraspinals                   [facility] Adult PT Treatment/Exercise - 09/16/19 0001      Moist Heat Therapy   Number Minutes Moist Heat  12 Minutes    Moist Heat Location  Cervical      Manual Therapy   Manual Therapy  Soft tissue mobilization;Joint mobilization    Manual therapy comments  skilled palpation for TPDN    Joint Mobilization  RT UPA C-3 to 6 grade 3    Soft tissue mobilization  STM to RT Upper trap, levator and cervical paraspinals       Trigger Point Dry Needling - 09/16/19  0001    Consent Given?  Yes    Education Handout Provided  Yes    Muscles Treated Head and Neck  Upper trapezius;Levator scapulae    Dry Needling Comments  .25 gage 60 mm    Upper Trapezius Response  Twitch reponse elicited;Palpable increased muscle length    Levator Scapulae Response  Twitch response elicited;Palpable increased muscle length           PT Education - 09/16/19 1051    Education Details  Education on TPDN aftercare and PRecautions and need for movement/exercises post PT RX    Person(s) Educated  Patient    Methods  Explanation;Handout    Comprehension  Verbalized understanding       PT Short Term Goals - 08/26/19 1214      PT SHORT TERM GOAL #1   Title  She will be indpendent with initial HEP    Status  Achieved        PT Long Term Goals - 09/16/19 1033      PT LONG TERM GOAL #1   Title  She will be independent with advanced strengthening HEP    Time  6    Period  Weeks    Status  New    Target Date  10/28/19      PT LONG TERM GOAL #2   Title  She wil be able to move head/ neck through full ROM with 1-2 max pain or less    Baseline  Pt with WNL  AROM cervical  See Flow chart    Time  6    Period  Weeks    Status  Achieved    Target Date  10/28/19      PT LONG TERM GOAL #3   Title  She will report full use of RT arm without pain    Baseline  Pt still  has radiating arm pain occassionally    Time  6    Period  Weeks    Status  On-going    Target Date  10/28/19      PT LONG TERM GOAL #4   Title  Pt will be able to move her neck for driving/turning without exacerbating pain on RT side to return to PLOF    Baseline  Pt 2/10 pain with movement    Time  6    Period  Weeks    Status  New    Target Date  10/28/19      PT LONG TERM GOAL #5   Title  FOTO decreased to 30% limited or better    Baseline  Pt is at 26% limiation    Time  8    Period  Weeks    Status  Achieved    Target Date  11/11/19      Additional Long Term Goals   Additional  Long Term Goals  Yes      PT LONG TERM GOAL #6   Title  Pt will be educated on communtiy wellness opportunities and will begin a walking program    Time  8    Period  Weeks    Status  New    Target Date  11/11/19            Plan - 09/16/19 1100    Clinical Impression Statement  Ms Leicht returns today with date out of plan of care and recieves re eval due to pt with residual RT upper trap pain/RTlevator/Rt cervical paraspinal residua 2/10 pain.  Pt has WFL AROM and good MMT but cannot turn neck to the RT without minimal pain.  Pt consented to TPDN and benefitted from trigger point pain release.  Pt will benefit from continuing skilled PT to address pain issue with TPDN before DC and will benefit from extension    Examination-Activity Limitations  Lift;Reach Overhead;Carry    Examination-Participation Restrictions  --   work and home tasks with turning head   Stability/Clinical Decision Making  Stable/Uncomplicated    Clinical Decision Making  Low    Rehab Potential  Good    PT Frequency  1x / week    PT Duration  6 weeks    PT Treatment/Interventions  Electrical Stimulation;Iontophoresis 88m/ml Dexamethasone;Moist Heat;Ultrasound;Therapeutic exercise;Manual techniques;Taping;Dry needling;Passive range of motion;Traction    PT Next Visit Plan  assess TPDN, continue TPDN for residual 2-5 visits    PT Home Exercise Plan  gentle cervical ROM, red band rows and ER right shoulder    Consulted and Agree with Plan of Care  Patient       Patient will benefit from skilled therapeutic intervention in order to improve the following deficits and impairments:  Pain, Increased muscle spasms, Decreased activity tolerance, Decreased range of motion  Visit Diagnosis: Cervicalgia  Chronic right shoulder pain  Stiffness of neck     Problem List Patient Active Problem List   Diagnosis Date Noted  . Arthropathy of lumbar facet joint 06/01/2019  . Primary osteoarthritis of both knees  06/01/2019  . Vitamin D deficiency 08/12/2018  . Iron deficiency anemia, unspecified 08/07/2018  . Class 2 obesity with body mass index (BMI) of 39.0 to 39.9 in adult 05/20/2018  . Type 2 diabetes mellitus ([place] 05/01/2017  . Bipolar disorder with psychotic features ([place] 04/28/2017  . Sickle cell trait ([place] 12/09/2014  . Benign essential hypertension 04/09/2013  . Familial multiple lipoprotein-type hyperlipidemia 04/09/2013  Voncille Lo, PT Certified Exercise Expert for the Aging Adult  09/16/19 4:03 PM Phone: [phone] Fax: [facility] [address] [place], [place], [address] Phone: [phone]   Fax:  [phone]  Name: Arrianna Catala MRN: [id number] Date of Birth: [birth year]  PHYSICAL THERAPY DISCHARGE SUMMARY  Visits from Start of Care: 7  Current functional level related to goals / functional outcomes: She canceled her next appointment and did not return after this session   Remaining deficits: Unknown   Education / Equipment: HEP Plan:                                                    Patient goals were not met. Patient is being discharged due to not returning since the last visit.  ?????   Pearson Forster PT       11/09/19

## 2019-09-17 ENCOUNTER — Ambulatory Visit: Payer: No Typology Code available for payment source | Admitting: Family Medicine

## 2019-09-17 ENCOUNTER — Other Ambulatory Visit: Payer: No Typology Code available for payment source

## 2019-09-17 ENCOUNTER — Encounter: Payer: Self-pay | Admitting: Family Medicine

## 2019-09-17 ENCOUNTER — Ambulatory Visit (INDEPENDENT_AMBULATORY_CARE_PROVIDER_SITE_OTHER): Payer: No Typology Code available for payment source | Admitting: Family Medicine

## 2019-09-17 VITALS — BP 132/70 | HR 90 | Temp 98.6°F | Resp 18 | Ht 62.0 in | Wt 242.8 lb

## 2019-09-17 DIAGNOSIS — F319 Bipolar disorder, unspecified: Secondary | ICD-10-CM

## 2019-09-17 DIAGNOSIS — E119 Type 2 diabetes mellitus without complications: Secondary | ICD-10-CM | POA: Diagnosis not present

## 2019-09-17 DIAGNOSIS — I10 Essential (primary) hypertension: Secondary | ICD-10-CM

## 2019-09-17 DIAGNOSIS — D509 Iron deficiency anemia, unspecified: Secondary | ICD-10-CM

## 2019-09-17 DIAGNOSIS — L84 Corns and callosities: Secondary | ICD-10-CM | POA: Diagnosis not present

## 2019-09-17 MED ORDER — LISINOPRIL 10 MG PO TABS: 10.0000 mg | ORAL_TABLET | Freq: Every day | ORAL | 3 refills | Status: DC

## 2019-09-17 MED FILL — LISINOPRIL 10 MG TABS: 10 | 30 days supply | Qty: 30 | Fill #0

## 2019-09-17 NOTE — Assessment & Plan Note (Signed)
Per above nutrition discussed

## 2019-09-17 NOTE — Patient Instructions (Addendum)
Look into Saxenda  We will call with lab results Use corn and callus pads Start lisinopril 10mg  once a day for blood pressure  F/U 4 months for Physical

## 2019-09-17 NOTE — Assessment & Plan Note (Signed)
Medications per her psychiatrist. 

## 2019-09-17 NOTE — Progress Notes (Signed)
Subjective:    Patient ID: Kaylee Hill, female    DOB: 05-05-1981, 39 y.o.   MRN: [id number]  Patient presents for Establish Care (R toe issue, started a month)  Patient here to establish care with me as well as follow-up medications.  She was seen by her PA who is no longer at the practice. Medications and history reviewed.  She is currently followed by Dr. Loni Beckwith for rheumatology secondary to chronic shoulder pain as well as osteoarthritis. She has osteoarthritis in her knees she is also had de Quervain's tenosynovitis. She is currently in PT, she had had dry needling yesterday  ROM has improved in shoulders  Her history is also notable for type 2 diabetes mellitus she had a fasting labs drawn this morning but her last A1c was 6.5% in July 2020  , currently on metformin 500mg  BID this was increased in July  2020, prior to that had been on once daily for pre -diabetes  Hyperlipidemia- last LDL in July 130, no current statin drug   Bipolar disorder/Schizoaffective disorder  she is followed by psychiatry currently on Abilify as well as Lamictal. No longer on celexa. She does notice a change in her mood if she misses doses Dr. 12-07-1976 - [facility]- PAP Smear UTD/ mammogram UTD    Hypertension- taking labetolol 200mg  once a day when she was trying for pregancy, no current contraception  Iron deficiency/vitamin D deficiency she is currently on replacement of vitamin D  She is trying to convert Pescetarian to help with weight and cholesterol  Eye Doctor- [facility]  Vaccine- Flu UTD, Covid vaccine #1   Due for Pneumonia vaccine      Review Of Systems:  GEN- denies fatigue, fever, weight loss,weakness, recent illness HEENT- denies eye drainage, change in vision, nasal discharge, CVS- denies chest pain, palpitations RESP- denies SOB, cough, wheeze ABD- denies N/V, change in stools, abd pain GU- denies dysuria, hematuria, dribbling,  incontinence MSK- denies joint pain, muscle aches, injury Neuro- denies headache, dizziness, syncope, seizure activity       Objective:    BP 132/70 (BP Location: Left Arm, Patient Position: Sitting, Cuff Size: Large)   Pulse 90   Temp 98.6 F (37 C) (Oral)   Resp 18   Ht 5\' 2"  (1.575 m)   Wt 242 lb 12.8 oz (110.1 kg)   SpO2 100%   BMI 44.41 kg/m  GEN- NAD, alert and oriented x3, obese HEENT- PERRL, EOMI, non injected sclera, pink conjunctiva,  Neck- Supple, no thyromegaly CVS- RRR, no murmur RESP-CTAB ABD-NABS,soft,NT,ND Psych normal affect and mood pleasant EXT- No edema, corns calluses bilateral feet third and fourth digit of the right foot third digit left foot Pulses- Radial, DP- 2+        Assessment & Plan:      Problem List Items Addressed This Visit      Unprioritized   Benign essential hypertension    Will DC labetalol start her on lisinopril 10 mg once a day.  She will monitor her blood pressure at home.  This will also offer renal protection with her diabetes mellitus.      Relevant Medications   lisinopril (ZESTRIL) 10 MG tablet   Bipolar disorder with psychotic features (HCC)    Medications per her psychiatrist       Morbid obesity (HCC)    Per above nutrition discussed       Type 2 diabetes mellitus (HCC)  Goal is an A1c less than 7%.  Continue Metformin.  We did discuss Saxenda and this may be a good adjunct for her weight loss and her diabetes.  She is going to look into the medication.  Also recommend that she continue to work towards the [employer].  Due To her just receiving COVID-19 vaccine I recommend that she defer her Pneumovax until her next visit.  She will also need urine microalbumin done at that visit.  Based on her lipid panel will discuss statin drug.      Relevant Medications   lisinopril (ZESTRIL) 10 MG tablet   Other Relevant Orders   HM Diabetes Foot Exam (Completed)    Other Visit Diagnoses     Corns/callosities    -  Primary   Relevant Orders   HM Diabetes Foot Exam (Completed)      Note: This dictation was prepared with Dragon dictation along with smaller phrase technology. Any transcriptional errors that result from this process are unintentional.

## 2019-09-17 NOTE — Assessment & Plan Note (Signed)
Will DC labetalol start her on lisinopril 10 mg once a day.  She will monitor her blood pressure at home.  This will also offer renal protection with her diabetes mellitus.

## 2019-09-17 NOTE — Assessment & Plan Note (Addendum)
Goal is an A1c less than 7%.  Continue Metformin.  We did discuss Saxenda and this may be a good adjunct for her weight loss and her diabetes.  She is going to look into the medication.  Also recommend that she continue to work towards the [employer].  Due To her just receiving COVID-19 vaccine I recommend that she defer her Pneumovax until her next visit.  She will also need urine microalbumin done at that visit.  Based on her lipid panel will discuss statin drug.

## 2019-09-18 LAB — COMPREHENSIVE METABOLIC PANEL
AG Ratio: 1.4 (calc) (ref 1.0–2.5)
ALT: 18 U/L (ref 6–29)
AST: 20 U/L (ref 10–30)
Albumin: 4.4 g/dL (ref 3.6–5.1)
Alkaline phosphatase (APISO): 78 U/L (ref 31–125)
BUN: 7 mg/dL (ref 7–25)
CO2: 29 mmol/L (ref 20–32)
Calcium: 9.5 mg/dL (ref 8.6–10.2)
Chloride: 103 mmol/L (ref 98–110)
Creat: 0.62 mg/dL (ref 0.50–1.10)
Globulin: 3.1 g/dL (calc) (ref 1.9–3.7)
Glucose, Bld: 94 mg/dL (ref 65–99)
Potassium: 4.8 mmol/L (ref 3.5–5.3)
Sodium: 139 mmol/L (ref 135–146)
Total Bilirubin: 0.3 mg/dL (ref 0.2–1.2)
Total Protein: 7.5 g/dL (ref 6.1–8.1)

## 2019-09-18 LAB — CBC WITH DIFFERENTIAL/PLATELET
Absolute Monocytes: 569 cells/uL (ref 200–950)
Basophils Absolute: 31 cells/uL (ref 0–200)
Basophils Relative: 0.4 %
Eosinophils Absolute: 187 cells/uL (ref 15–500)
Eosinophils Relative: 2.4 %
HCT: 37.1 % (ref 35.0–45.0)
Hemoglobin: 11.9 g/dL (ref 11.7–15.5)
Lymphs Abs: 2808 cells/uL (ref 850–3900)
MCH: 24.2 pg — ABNORMAL LOW (ref 27.0–33.0)
MCHC: 32.1 g/dL (ref 32.0–36.0)
MCV: 75.6 fL — ABNORMAL LOW (ref 80.0–100.0)
MPV: 10.3 fL (ref 7.5–12.5)
Monocytes Relative: 7.3 %
Neutro Abs: 4204 cells/uL (ref 1500–7800)
Neutrophils Relative %: 53.9 %
Platelets: 320 10*3/uL (ref 140–400)
RBC: 4.91 10*6/uL (ref 3.80–5.10)
RDW: 14.9 % (ref 11.0–15.0)
Total Lymphocyte: 36 %
WBC: 7.8 10*3/uL (ref 3.8–10.8)

## 2019-09-18 LAB — LIPID PANEL
Cholesterol: 190 mg/dL (ref <200)
HDL: 43 mg/dL — ABNORMAL LOW (ref >=50)
LDL Cholesterol (Calc): 124 mg/dL (calc) — ABNORMAL HIGH
Non-HDL Cholesterol (Calc): 147 mg/dL (calc) — ABNORMAL HIGH (ref <130)
Total CHOL/HDL Ratio: 4.4 (calc) (ref <5.0)
Triglycerides: 121 mg/dL (ref <150)

## 2019-09-18 LAB — HEMOGLOBIN A1C
Hgb A1c MFr Bld: 6.5 % of total Hgb — ABNORMAL HIGH (ref <5.7)
Mean Plasma Glucose: 140 (calc)
eAG (mmol/L): 7.7 (calc)

## 2019-09-20 ENCOUNTER — Encounter: Payer: Self-pay | Admitting: Family Medicine

## 2019-09-20 ENCOUNTER — Ambulatory Visit: Payer: No Typology Code available for payment source

## 2019-09-20 MED ORDER — SAXENDA 18 MG/3ML ~~LOC~~ SOPN: 0.6000 mg | PEN_INJECTOR | Freq: Every day | SUBCUTANEOUS | 3 refills | Status: DC

## 2019-09-22 ENCOUNTER — Telehealth: Payer: Self-pay | Admitting: *Deleted

## 2019-09-22 NOTE — Telephone Encounter (Signed)
Received request from pharmacy for PA on Saxenda.  PA submitted.   Dx: E66.01- obesity   The requested drug is being used as an adjunct to lifestyle modification (e.g., dietary or caloric restriction, exercise, behavioral support, community based program). The patient has engaged in > 9months of lifestyle modification and failed to achieve desired weight loss. The patient has BMI greater than or equal to 30 kilograms / square meter with additional disorder related to weight (hypertension).  Patient is unable to use Contrave/ Phentermine/ Qsymia due to concurrent treatment of HTN/ BIPOLAR 1 (I10/F31.9).

## 2019-09-22 NOTE — Telephone Encounter (Signed)
[employer] is reviewing your PA request. You may close this dialog, return to your dashboard, and perform other tasks.  To check for an update later, open this request again from your dashboard. If [employer] has not replied within 24 hours for urgent requests or within 48 hours for standard requests, please contact [employer] at [phone]. 

## 2019-09-24 ENCOUNTER — Encounter: Payer: Self-pay | Admitting: Family Medicine

## 2019-09-27 ENCOUNTER — Telehealth: Payer: Self-pay | Admitting: Family Medicine

## 2019-09-27 MED FILL — SAXENDA 18 MG/3 ML PEN: 18 | 30 days supply | Qty: 15 | Fill #0

## 2019-09-27 NOTE — Telephone Encounter (Signed)
Received a fax today from [employer] stating that patient's Kaylee Hill has been approved. Prior Authorization reference number: [id number]. Authorization effective: 09/20/2019-01/17/2020. She is approved for 15 Ml (5 pens) per 30 days

## 2019-09-28 MED ORDER — SAXENDA 18 MG/3ML ~~LOC~~ SOPN: 0.6000 mg | PEN_INJECTOR | Freq: Every day | SUBCUTANEOUS | 3 refills | Status: DC

## 2019-09-28 MED ORDER — BD PEN NEEDLE MICRO U/F 32G X 6 MM MISC: 1 refills | Status: DC

## 2019-09-28 MED FILL — UNIFINE PENTIPS 6MM 31G: 31G X 6 MM | 90 days supply | Qty: 100 | Fill #0

## 2019-09-28 NOTE — Telephone Encounter (Signed)
Received PA determination.   PA [id number] approved 09/20/2019- 05/17/201.  Pharmacy made aware.   Renewal for Saxenda requires that you have lost at least 4% of baseline body weight after 4 months of treatment.

## 2019-10-04 MED FILL — FUSION PLUS CAPSULE: 30 days supply | Qty: 30 | Fill #3

## 2019-10-06 ENCOUNTER — Ambulatory Visit: Payer: No Typology Code available for payment source | Admitting: Neurology

## 2019-10-11 ENCOUNTER — Other Ambulatory Visit: Payer: Self-pay

## 2019-10-11 ENCOUNTER — Ambulatory Visit: Payer: No Typology Code available for payment source | Admitting: Neurology

## 2019-10-11 ENCOUNTER — Encounter: Payer: Self-pay | Admitting: Neurology

## 2019-10-11 VITALS — BP 141/97 | HR 85 | Temp 97.2°F | Ht 62.0 in | Wt 241.3 lb

## 2019-10-11 DIAGNOSIS — Z9989 Dependence on other enabling machines and devices: Secondary | ICD-10-CM

## 2019-10-11 DIAGNOSIS — G4733 Obstructive sleep apnea (adult) (pediatric): Secondary | ICD-10-CM | POA: Diagnosis not present

## 2019-10-11 NOTE — Progress Notes (Signed)
Subjective:    Patient ID: Kaylee Hill is a 39 y.o. female.  HPI     Interim history:   Kaylee Hill is a 39 year old right-handed woman with an underlying medical history of hypertension, depression, anxiety, sickle cell trait, hyperlipidemia, iron deficiency, vitamin D deficiency, history of gestational diabetes and morbid obesity with a BMI of over 27, who presents for follow-up consultation of her obstructive sleep apnea, on autoPap therapy.  The patient is unaccompanied today.  I last saw her on 04/05/2019, at which time she had established treatment on AutoPap and was doing well.  She had noticed improvement in her sleep quality and daytime somnolence.   Today, 10/11/2019: I reviewed her AutoPap compliance data from 09/08/2019 through 10/07/2019 which is a total of 30 days, during which time she used her machine every night with percent use days greater than 4 hours at 97%, indicating excellent compliance with an average usage of 7 hours and 8 minutes, residual AHI at goal at 4.2/h, leak on the high side with a 95th percentile at 27.8 L/min, 95th percentile pressure is 13.9 cm, range of 7 to 14 cm.  She reports overall doing well, sometimes she has more fatigue, feels like perhaps the settings need to be changed.  She uses a full facemask, it covers the nose, she may have tried in the very beginning a under the nose type of full facemask and did not really like that style.  She would be willing to seek a mask refit.  She would be willing to try a slightly higher maximum AutoPap pressure.  She has been on Saxenda, Metformin was therefore reduced and she has restarted lisinopril per her PCP.  The patient's allergies, current medications, family history, past medical history, past social history, past surgical history and problem list were reviewed and updated as appropriate.      Previously:   I first met her on 12/16/2018 and virtual visit at the request of her primary care PA, at which time  she reported snoring, excessive daytime somnolence and witnessed apneas.  She was advised to proceed with a home sleep test.  She had this on 01/12/2019 which indicated severe sleep apnea with an AHI of 64.3/h, O2 nadir of 81%.  She was advised to proceed with treatment at home in the form of AutoPap therapy.  I reviewed her AutoPap compliance data from 03/01/2019 through 03/30/2019 which is a total of 30 days, during which time she used her AutoPap every night with percent used days greater than 4 hours at 100%, indicating superb compliance with an average usage of 6 hours and 30 minutes, residual AHI at goal at 2.5/h, 95th percentile of pressure at 13.9 cm, range of 7 cm to 14 cm, leak in the acceptable range with the 95th percentile at 16.5 L/min.     12/16/18: I am conducting a virtual, video based new patient visit via Webex in lieu of a face-to-face visit for evaluation of her sleep disorder, in particular, concern for underlying obstructive sleep apnea. The patient is unaccompanied today and joins via cell phone from home. She is referred by her primary care PA, Verline Lema, and I reviewed her note from 09/25/2018. Patient endorses snoring, witnessed apneas and excessive daytime somnolence. Her Epworth sleepiness score is 20 out of 24, fatigue severity score is 56 out of 63.  Note, she is on potentially sedating medications including Abilify, lamotrigine and a beta blocker, labetalol.she has been on Abilify for the past 5+ years,  on Lamictal only for the past few months. 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 Bedtime is around midnight and rise time around 10. Her husband and her mother-in-law have witnessed breathing pauses while she is asleep. She has had infrequent episodes of sleep paralysis, happens maybe once every 3 months, has noticed this for the past few years, maybe as long as 7 years. Her snoring wakes her up at times and sometimes she has  woken up with a sense of gasping for air. She does not have recurrent morning headaches or night to night nocturia. She's not aware of any family history of OSA. She's not had a tonsillectomy. She has 4 dogs in the household, to our her mother-in-law's and 2 are affairs, they sleep on the floor in her bedroom. She lives with her husband, 32-year-old daughter, mother-in-law and sister-in-law. Her daughter sleeps in her own bed in their bedroom. She has a TV on at night for white noise. She does not drink caffeine on a daily basis secondary to feeling jittery from it. She does not utilize any alcohol currently and is a nonsmoker, she smoked one year between 1999 and 2000. Her mood is stable currently on her medication regimen, she is followed by mental health.    Her Past Medical History Is Significant For: Past Medical History:  Diagnosis Date  . Anxiety   . Bipolar 1 disorder ([place])   . Depression   . Gestational diabetes   . Hypertension     Her Past Surgical History Is Significant For: Past Surgical History:  Procedure Laterality Date  . BREAST SURGERY      Her Family History Is Significant For: Family History  Problem Relation Age of Onset  . Bipolar disorder Mother   . Diabetes Father   . Hypertension Father   . Schizophrenia Father   . Asthma Brother   . Healthy Daughter   . Diabetes Maternal Grandmother   . Cancer Maternal Grandmother        breast, pancreatic cancer  . Dementia Maternal Grandfather     Her Social History Is Significant For: Social History   Socioeconomic History  . Marital status: Married    Spouse name: Not on file  . Number of children: 1  . Years of education: Not on file  . Highest education level: Some college, no degree  Occupational History  . Occupation: Marine scientist: [employer]  Tobacco Use  . Smoking status: Former Smoker    Types: Cigarettes  . Smokeless tobacco: Never Used  Substance and Sexual Activity  . Alcohol use:  No  . Drug use: No  . Sexual activity: Yes    Birth control/protection: Implant  Other Topics Concern  . Not on file  Social History Narrative  . Not on file   Social Determinants of Health   Financial Resource Strain:   . Difficulty of Paying Living Expenses: Not on file  Food Insecurity:   . Worried About Charity fundraiser in the Last Year: Not on file  . Ran Out of Food in the Last Year: Not on file  Transportation Needs:   . Lack of Transportation (Medical): Not on file  . Lack of Transportation (Non-Medical): Not on file  Physical Activity:   . Days of Exercise per Week: Not on file  . Minutes of Exercise per Session: Not on file  Stress:   . Feeling of  Stress : Not on file  Social Connections:   . Frequency of Communication with Friends and Family: Not on file  . Frequency of Social Gatherings with Friends and Family: Not on file  . Attends Religious Services: Not on file  . Active Member of Clubs or Organizations: Not on file  . Attends Archivist Meetings: Not on file  . Marital Status: Not on file    Her Allergies Are:  No Known Allergies:   Her Current Medications Are:  Outpatient Encounter Medications as of 10/11/2019  Medication Sig  . ARIPiprazole (ABILIFY) 15 MG tablet Take 15 mg by mouth daily.   Marland Kitchen b complex vitamins tablet Take 1 tablet by mouth daily.  . Cholecalciferol (VITAMIN D3) 125 MCG (5000 UT) CAPS Take by mouth daily.  . hydrocortisone (ANUSOL-HC) 25 MG suppository Place 1 suppository (25 mg total) rectally 2 (two) times daily as needed for hemorrhoids or anal itching.  . Insulin Pen Needle (BD PEN NEEDLE MICRO U/F) 32G X 6 MM MISC Use as instructed to inject Saxenda SQ QD.  . Iron-FA-B Cmp-C-Biot-Probiotic (FUSION PLUS) CAPS Take 1 capsule by mouth at bedtime.  . lamoTRIgine (LAMICTAL) 100 MG tablet TAKE 2 TABLETS BY MOUTH AT BEDTIME.  Marland Kitchen Liraglutide -Weight Management (SAXENDA) 18 MG/3ML SOPN Inject 0.6 mg into the skin daily. Titrate  up as directed to 82m daily  . lisinopril (ZESTRIL) 10 MG tablet Take 1 tablet (10 mg total) by mouth daily.  . metFORMIN (GLUCOPHAGE) 500 MG tablet Take 1 tablet (500 mg total) by mouth 2 (two) times daily with a meal. (Patient taking differently: Take 500 mg by mouth daily with breakfast. )  . Multiple Vitamin (MULTIVITAMIN) tablet Take 1 tablet by mouth daily.  . Omega-3 Fatty Acids (OMEGA 3 PO) Take by mouth daily.   No facility-administered encounter medications on file as of 10/11/2019.  :  Review of Systems:  Out of a complete 14 point review of systems, all are reviewed and negative with the exception of these symptoms as listed below: Review of Systems  Neurological:       Reports cpap machine is working well. Reports fatigue throughout the day and feels like settings may need to be adjusted.     Objective:  Neurological Exam  Physical Exam Physical Examination:   Vitals:   10/11/19 0832  BP: (!) 141/97  Pulse: 85  Temp: (!) 97.2 F (36.2 C)   General Examination: The patient is a very pleasant 39y.o. female in no acute distress. She appears well-developed and well-nourished and well groomed.   HEENT: Normocephalic, atraumatic, pupils are reactive to light. Extraocular tracking is good. Normal smooth pursuit is noted. Corrective eyeglasses in place, new eyeglasses. Hearing is grossly intact. Face is symmetric with normal facial animation and normal facial sensation. Speech is clear with no dysarthria noted. There is no hypophonia. There is no lip, neck/head, jaw or voice tremor. Mild mouth dryness noted, otherwise oropharynx exam is stable.   Chest: Clear to auscultation without wheezing, rhonchi or crackles noted.  Heart: SP6+P9+5 mild systolic murmur noted.   Abdomen: Soft, non-tender and non-distended with normal bowel sounds appreciated on auscultation.  Extremities: There is no pitting edema in the distal lower extremities bilaterally.   Skin: Warm and dry  without trophic changes noted.  Musculoskeletal: exam reveals no obvious joint deformities, tenderness or joint swelling or erythema.   Neurologically:  Mental status: The patient is awake, alert and oriented in all 4 spheres. Her immediate  and remote memory, attention, language skills and fund of knowledge are appropriate. There is no evidence of aphasia, agnosia, apraxia or anomia. Speech is clear with normal prosody and enunciation. Thought process is linear. Mood is normal and affect is normal.  Cranial nerves II - XII are as described above under HEENT exam. Motor exam: Normal bulk, strength and tone is noted. There is no tremor. Romberg is negative. Fine motor skills and coordination: grossly intact.  Cerebellar testing: No dysmetria or intention tremor. There is no truncal or gait ataxia.  Sensory exam: intact to light touch.  Gait, station and balance: She stands easily. No veering to one side is noted. No leaning to one side is noted. Posture is age-appropriate and stance is narrow based. Gait shows normal stride length and normal pace. No problems turning are noted. Tandem walk is slightly challenging for her.              Assessment and Plan:  In summary, Aelyn Stanaland is a very pleasant 39 year old female  with an underlying medical history of hypertension, depression, anxiety, sickle cell trait, hyperlipidemia, iron deficiency, vitamin D deficiency, history of gestational diabetes and morbid obesity with a BMI of over 74, who presents for follow-up consultation of her severe obstructive sleep apnea as determined by home sleep testing on 01/12/2019.  She Continues to be fully compliant with her AutoPap.  She noticed improvement in her sleep quality and daytime energy level.  She is commended for her treatment adherence, she will benefit from them fit.  I have requested 1 through her DME company, in addition, we mutually agreed to increase her maximum AutoPap pressure to 15 cm.  She is  working on weight loss.  She is advised at this juncture to follow-up routinely in 1 year to see the nurse practitioner.  I answered all her questions today and she was in agreement.  I spent 20 minutes in total face-to-face time and in reviewing records during pre-charting, more than 50% of which was spent in counseling and coordination of care, reviewing test results, reviewing medication and discussing or reviewing the diagnosis of OSA, the prognosis and treatment options. Pertinent laboratory and imaging test results that were available during this visit with the patient were reviewed by me and considered in my medical decision making (see chart for details).

## 2019-10-11 NOTE — Patient Instructions (Signed)
Please continue using your autoPAP regularly. While your insurance requires that you use PAP at least 4 hours each night on 70% of the nights, I recommend, that you not skip any nights and use it throughout the night if you can. Getting used to PAP and staying with the treatment long term does take time and patience and discipline. Untreated obstructive sleep apnea when it is moderate to severe can have an adverse impact on cardiovascular health and raise her risk for heart disease, arrhythmias, hypertension, congestive heart failure, stroke and diabetes. Untreated obstructive sleep apnea causes sleep disruption, nonrestorative sleep, and sleep deprivation. This can have an impact on your day to day functioning and cause daytime sleepiness and impairment of cognitive function, memory loss, mood disturbance, and problems focussing. Using PAP regularly can improve these symptoms. As discussed, you may benefit from a change in your mask, please make an appointment with your DME provider, [facility], for a mask refit.  In addition, we will increase your maximum AutoPap pressure to 15 cm, it is currently at 14 cm. Keep up the good work! We can see you in 1 year, you can see one of our nurse practitioners as you are stable.

## 2019-11-09 MED FILL — ARIPiprazole 15 MG TABS: 15 | 30 days supply | Qty: 30 | Fill #1

## 2019-11-17 MED FILL — LISINOPRIL 10 MG TABS: 10 | 30 days supply | Qty: 30 | Fill #1

## 2019-11-17 MED FILL — SAXENDA 18 MG/3 ML PEN: 18 | 30 days supply | Qty: 15 | Fill #1

## 2019-11-25 ENCOUNTER — Encounter: Payer: Self-pay | Admitting: Family Medicine

## 2019-11-29 MED FILL — lamoTRIgine 100 MG TABS: 100 | 90 days supply | Qty: 180 | Fill #0

## 2019-11-29 MED FILL — traZODone HCL 50 MG TABS: 50 | 90 days supply | Qty: 180 | Fill #0

## 2019-12-02 IMAGING — CR DG KNEE COMPLETE 4+V*R*
4 series · 4 of 4 positions shown · non-contrast
Comparison: None.

CLINICAL DATA: Knee pain for 2 weeks

EXAM:
RIGHT KNEE - COMPLETE 4+ VIEW

[knee lat]
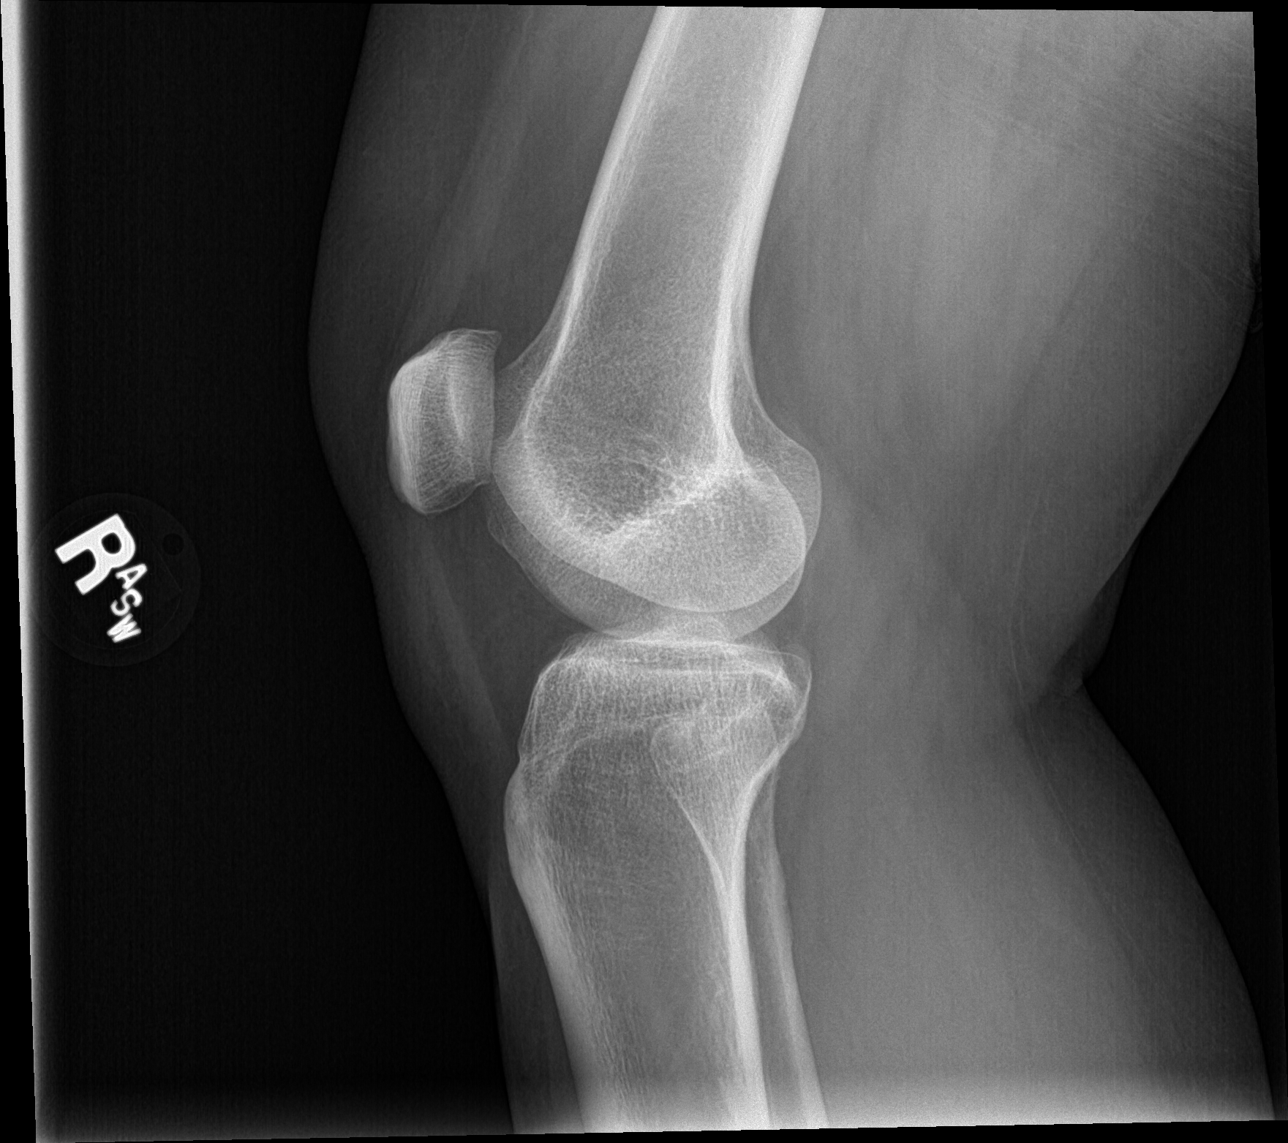

[tunnel]
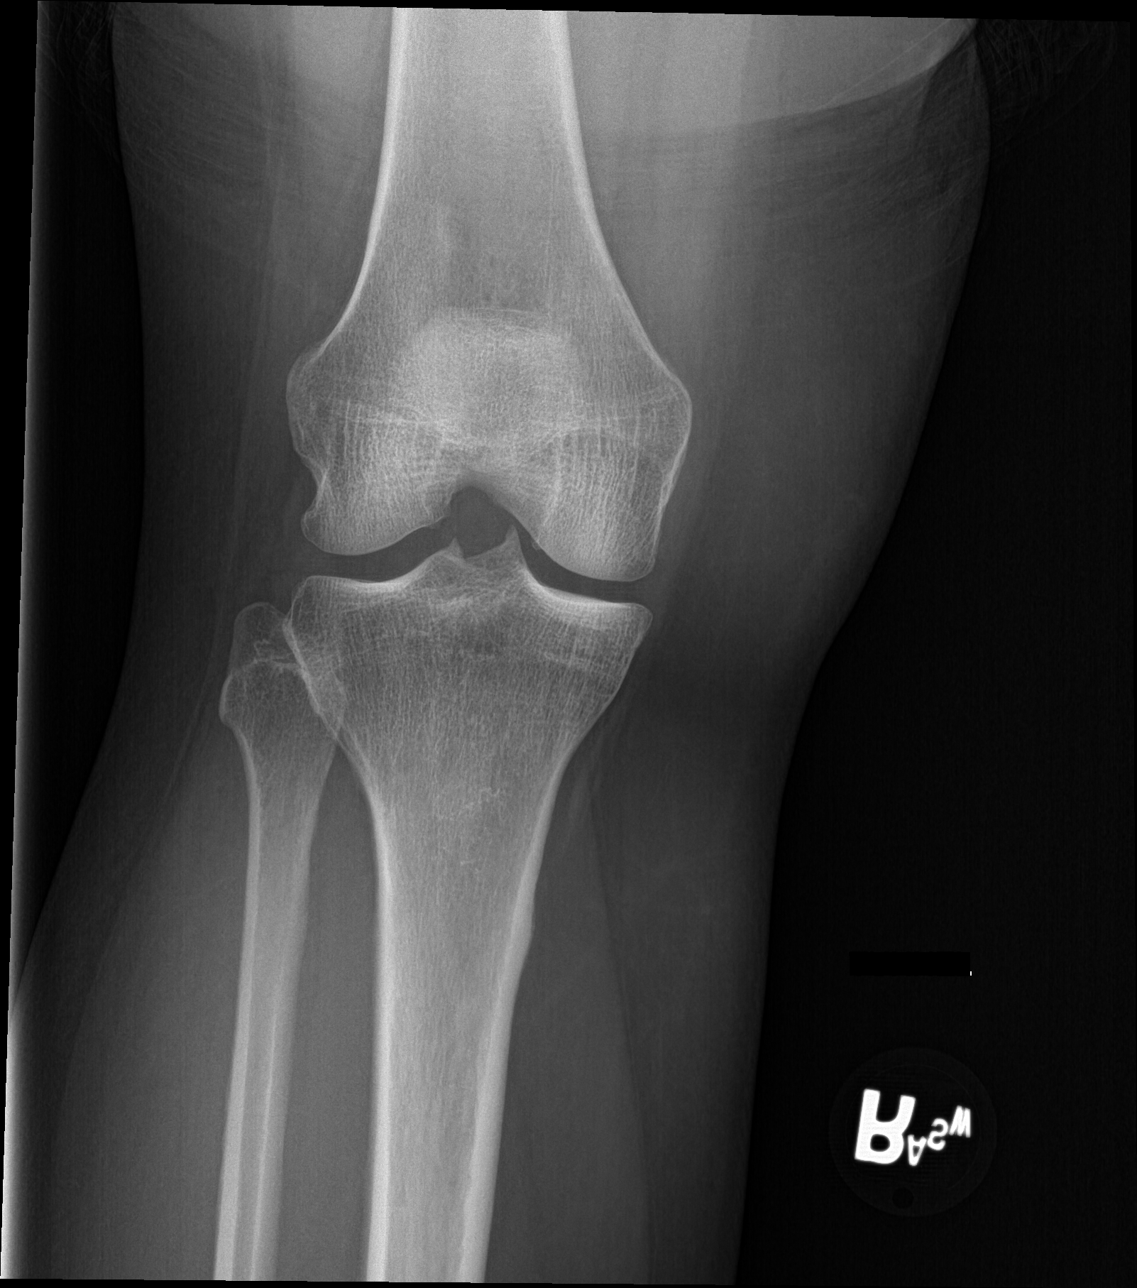

[patella skyline]
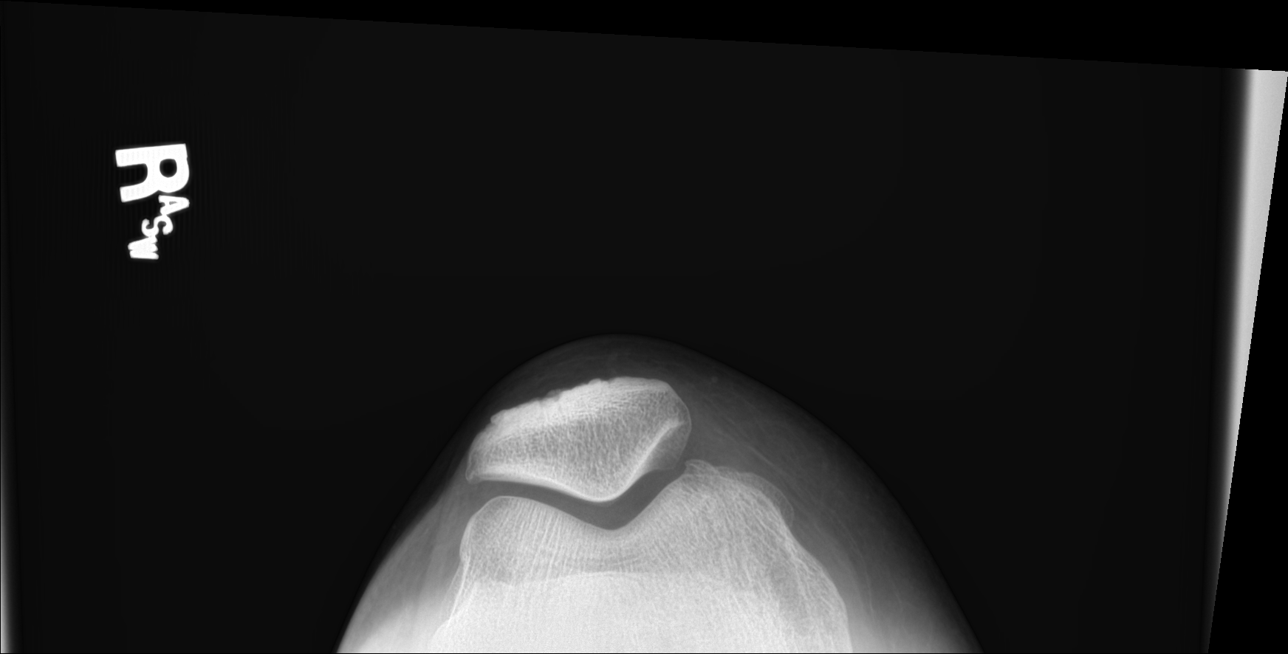

[knee ap]
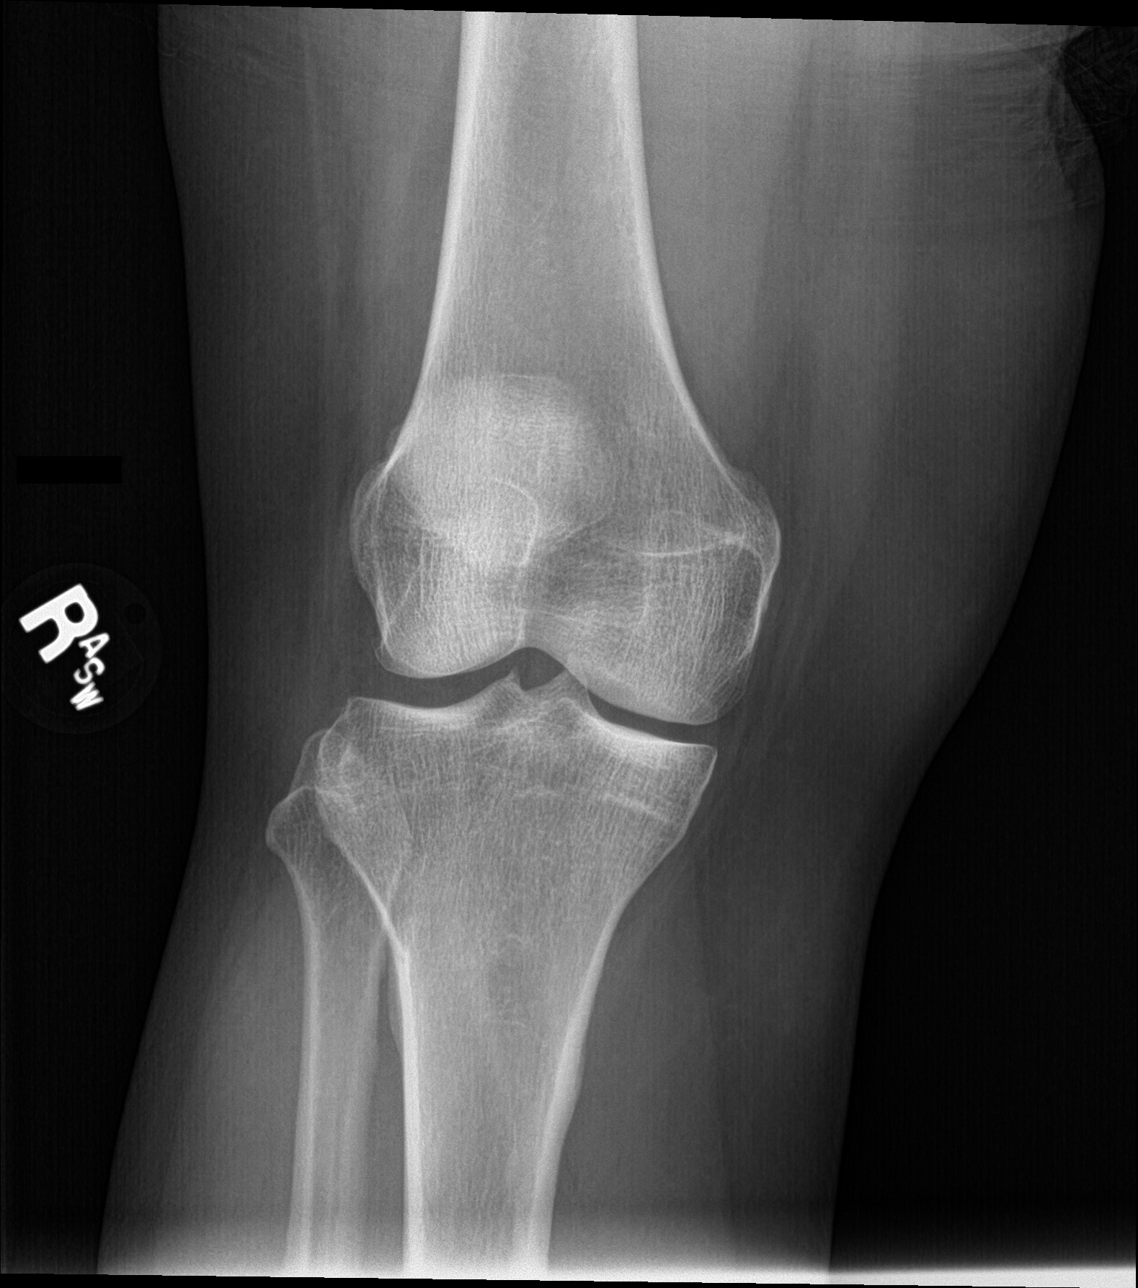

[4 of 4 positions shown; findings below may reference images not displayed]

FINDINGS: No fracture or malalignment. Minimal patellofemoral spurring.
Minimal degenerative change of the medial compartment. No
significant effusion.
IMPRESSION: Minimal degenerative changes.  No acute osseous abnormality

## 2019-12-14 NOTE — Progress Notes (Deleted)
Office Visit Note  Patient: Kaylee Hill             Date of Birth: 17-Jul-1981           MRN: [id number]             PCP: Alycia Rossetti, MD Referring: Alycia Rossetti, MD Visit Date: 12/22/2019 Occupation: @GUAROCC @  Subjective:  No chief complaint on file.   History of Present Illness: Kaylee Hill is a 39 y.o. female ***   Activities of Daily Living:  Patient reports morning stiffness for *** {minute/hour:19697}.   Patient {ACTIONS;DENIES/REPORTS:21021675::"Denies"} nocturnal pain.  Difficulty dressing/grooming: {ACTIONS;DENIES/REPORTS:21021675::"Denies"} Difficulty climbing stairs: {ACTIONS;DENIES/REPORTS:21021675::"Denies"} Difficulty getting out of chair: {ACTIONS;DENIES/REPORTS:21021675::"Denies"} Difficulty using hands for taps, buttons, cutlery, and/or writing: {ACTIONS;DENIES/REPORTS:21021675::"Denies"}  No Rheumatology ROS completed.   PMFS History:  Patient Active Problem List   Diagnosis Date Noted  . Arthropathy of lumbar facet joint 06/01/2019  . Primary osteoarthritis of both knees 06/01/2019  . Vitamin D deficiency 08/12/2018  . Iron deficiency anemia, unspecified 08/07/2018  . Morbid obesity ([place]) 05/20/2018  . Type 2 diabetes mellitus ([place]) 05/01/2017  . Bipolar disorder with psychotic features ([place]) 04/28/2017  . Sickle cell trait ([place]) 12/09/2014  . Benign essential hypertension 04/09/2013  . Familial multiple lipoprotein-type hyperlipidemia 04/09/2013    Past Medical History:  Diagnosis Date  . Anxiety   . Bipolar 1 disorder ([place])   . Depression   . Gestational diabetes   . Hypertension     Family History  Problem Relation Age of Onset  . Bipolar disorder Mother   . Diabetes Father   . Hypertension Father   . Schizophrenia Father   . Asthma Brother   . Healthy Daughter   . Diabetes Maternal Grandmother   . Cancer Maternal Grandmother        breast, pancreatic cancer  . Dementia Maternal Grandfather    Past Surgical History:    Procedure Laterality Date  . BREAST SURGERY     Social History   Social History Narrative  . Not on file   Immunization History  Administered Date(s) Administered  . Hepatitis B, adult 07/21/2014  . Influenza Whole 08/02/2013, 05/17/2014  . Influenza,inj,Quad PF,6+ Mos 05/19/2015, 04/25/2017  . Influenza-Unspecified 08/02/2014  . Meningococcal Polysaccharide 07/21/2014  . Tdap 04/09/2013, 04/18/2015     Objective: Vital Signs: There were no vitals taken for this visit.   Physical Exam   Musculoskeletal Exam: ***  CDAI Exam: CDAI Score: -- Patient Global: --; Provider Global: -- Swollen: --; Tender: -- Joint Exam 12/22/2019   No joint exam has been documented for this visit   There is currently no information documented on the homunculus. Go to the Rheumatology activity and complete the homunculus joint exam.  Investigation: No additional findings.  Imaging: No results found.  Recent Labs: Lab Results  Component Value Date   WBC 7.8 09/17/2019   HGB 11.9 09/17/2019   PLT 320 09/17/2019   NA 139 09/17/2019   K 4.8 09/17/2019   CL 103 09/17/2019   CO2 29 09/17/2019   GLUCOSE 94 09/17/2019   BUN 7 09/17/2019   CREATININE 0.62 09/17/2019   BILITOT 0.3 09/17/2019   ALKPHOS 85 04/27/2017   AST 20 09/17/2019   ALT 18 09/17/2019   PROT 7.5 09/17/2019   ALBUMIN 3.9 04/27/2017   CALCIUM 9.5 09/17/2019   GFRAA 128 03/10/2019    Speciality Comments: No specialty comments available.  Procedures:  No procedures performed Allergies: Patient has no known allergies.  Assessment / Plan:     Visit Diagnoses: No diagnosis found.  Orders: No orders of the defined types were placed in this encounter.  No orders of the defined types were placed in this encounter.   Face-to-face time spent with patient was *** minutes. Greater than 50% of time was spent in counseling and coordination of care.  Follow-Up Instructions: No follow-ups on file.   Gearldine Bienenstock, PA-C  Note - This record has been created using Dragon software.  Chart creation errors have been sought, but may not always  have been located. Such creation errors do not reflect on  the standard of medical care.

## 2019-12-22 ENCOUNTER — Ambulatory Visit: Payer: No Typology Code available for payment source | Admitting: Physician Assistant

## 2019-12-30 NOTE — Progress Notes (Signed)
Office Visit Note  Patient: Kaylee Hill             Date of Birth: June 21, 1981           MRN: [id number]             PCP: Salley Scarlet, MD Referring: Salley Scarlet, MD Visit Date: 01/04/2020 Occupation: @GUAROCC @  Subjective:  Right shoulder joint pain   History of Present Illness: Kaylee Hill is a 39 y.o. female with history of chronic right shoulder pain and osteoarthritis.  Patient reports that her right shoulder joint pain has improved since going to physical therapy.  She states that she continues to work on home exercises and strengthening exercises at the gym on a regular basis.  She denies any nocturnal pain at this time.  She states that with certain positions she will experience a sudden onset pain in the right shoulder which radiates into the right armpit.  She states that she has had about 2-3 episodes like this in the past 1 month.  She currently rates her shoulder pain a 2 out of 10 she denies any other joint pain or joint swelling at this time.  She states that her knee joints have been doing well without any discomfort.  She has occasional lower back pain but has been walking for exercise which seems to be helping with the stiffness.   Activities of Daily Living:  Patient reports morning stiffness for 0 none.   Patient Denies nocturnal pain.  Difficulty dressing/grooming: Denies Difficulty climbing stairs: Denies Difficulty getting out of chair: Denies Difficulty using hands for taps, buttons, cutlery, and/or writing: Denies  Review of Systems  Constitutional: Positive for fatigue.  HENT: Negative for mouth sores, mouth dryness and nose dryness.   Eyes: Negative for pain, visual disturbance and dryness.  Respiratory: Negative for cough, hemoptysis, shortness of breath and difficulty breathing.   Cardiovascular: Negative for chest pain, palpitations, hypertension and swelling in legs/feet.  Gastrointestinal: Negative for blood in stool, constipation and  diarrhea.  Endocrine: Negative for excessive thirst and increased urination.  Genitourinary: Negative for difficulty urinating and painful urination.  Musculoskeletal: Positive for arthralgias and joint pain. Negative for joint swelling, myalgias, muscle weakness, morning stiffness, muscle tenderness and myalgias.  Skin: Negative for color change, pallor, rash, hair loss, nodules/bumps, skin tightness, ulcers and sensitivity to sunlight.  Allergic/Immunologic: Negative for susceptible to infections.  Neurological: Negative for dizziness, numbness, headaches and weakness.  Hematological: Negative for bruising/bleeding tendency and swollen glands.  Psychiatric/Behavioral: Negative for depressed mood and sleep disturbance. The patient is not nervous/anxious.     PMFS History:  Patient Active Problem List   Diagnosis Date Noted  . Arthropathy of lumbar facet joint 06/01/2019  . Primary osteoarthritis of both knees 06/01/2019  . Vitamin D deficiency 08/12/2018  . Iron deficiency anemia, unspecified 08/07/2018  . Morbid obesity (HCC) 05/20/2018  . Type 2 diabetes mellitus (HCC) 05/01/2017  . Bipolar disorder with psychotic features (HCC) 04/28/2017  . Sickle cell trait (HCC) 12/09/2014  . Benign essential hypertension 04/09/2013  . Familial multiple lipoprotein-type hyperlipidemia 04/09/2013    Past Medical History:  Diagnosis Date  . Anxiety   . Bipolar 1 disorder (HCC)   . Depression   . Gestational diabetes   . Hypertension     Family History  Problem Relation Age of Onset  . Bipolar disorder Mother   . Diabetes Father   . Hypertension Father   . Schizophrenia Father   . Asthma  Brother   . Healthy Daughter   . Diabetes Maternal Grandmother   . Cancer Maternal Grandmother        breast, pancreatic cancer  . Dementia Maternal Grandfather    Past Surgical History:  Procedure Laterality Date  . BREAST SURGERY     Social History   Social History Narrative  . Not on file     Immunization History  Administered Date(s) Administered  . Hepatitis B, adult 07/21/2014  . Influenza Whole 08/02/2013, 05/17/2014  . Influenza,inj,Quad PF,6+ Mos 05/19/2015, 04/25/2017  . Influenza-Unspecified 08/02/2014  . Meningococcal Polysaccharide 07/21/2014  . Tdap 04/09/2013, 04/18/2015     Objective: Vital Signs: BP (!) 148/90 (BP Location: Left Arm, Patient Position: Sitting, Cuff Size: Normal)   Pulse 91   Resp 16   Ht 5\' 2"  (1.575 m)   Wt 245 lb 6.4 oz (111.3 kg)   BMI 44.88 kg/m    Physical Exam Vitals and nursing note reviewed.  Constitutional:      Appearance: She is well-developed.  HENT:     Head: Normocephalic and atraumatic.  Eyes:     Conjunctiva/sclera: Conjunctivae normal.  Pulmonary:     Effort: Pulmonary effort is normal.  Abdominal:     General: Bowel sounds are normal.     Palpations: Abdomen is soft.  Musculoskeletal:     Cervical back: Normal range of motion.  Lymphadenopathy:     Cervical: No cervical adenopathy.  Skin:    General: Skin is warm and dry.     Capillary Refill: Capillary refill takes less than 2 seconds.  Neurological:     Mental Status: She is alert and oriented to person, place, and time.  Psychiatric:        Behavior: Behavior normal.      Musculoskeletal Exam: C-spine, thoracic spine, lumbar spine good range of motion.  Shoulder joints have good range of motion with no discomfort at this time.  Elbow joints, wrist joints, MCPs, PIPs and DIPs good range of motion no synovitis.  She is complete fist formation bilaterally.  Hip joints have good range of motion with no discomfort.  No tenderness over trochanter bursa bilaterally.  Knee joints have good range of motion with no warmth or effusion.  Ankle joints have good range of motion no tenderness or inflammation.  CDAI Exam: CDAI Score: -- Patient Global: --; Provider Global: -- Swollen: --; Tender: -- Joint Exam 01/04/2020   No joint exam has been documented for  this visit   There is currently no information documented on the homunculus. Go to the Rheumatology activity and complete the homunculus joint exam.  Investigation: No additional findings.  Imaging: No results found.  Recent Labs: Lab Results  Component Value Date   WBC 7.8 09/17/2019   HGB 11.9 09/17/2019   PLT 320 09/17/2019   NA 139 09/17/2019   K 4.8 09/17/2019   CL 103 09/17/2019   CO2 29 09/17/2019   GLUCOSE 94 09/17/2019   BUN 7 09/17/2019   CREATININE 0.62 09/17/2019   BILITOT 0.3 09/17/2019   ALKPHOS 85 04/27/2017   AST 20 09/17/2019   ALT 18 09/17/2019   PROT 7.5 09/17/2019   ALBUMIN 3.9 04/27/2017   CALCIUM 9.5 09/17/2019   GFRAA 128 03/10/2019    Speciality Comments: No specialty comments available.  Procedures:  No procedures performed Allergies: Patient has no known allergies.   Assessment / Plan:     Visit Diagnoses: Chronic right shoulder pain: She has full range of motion  of the right shoulder joint on exam.  No tenderness or effusion was noted on exam.  She went to physical therapy which improved her range of motion and pain.  She has not had any nocturnal pain recently.  She experiences infrequent episodic pain in the right shoulder with certain positions.  She experiences rating pain in her right armpit during these episodes.  She currently rates her pain a 2 out of 10.  She continues to perform home exercises and strengthening exercises at the gym on a regular basis.  She was encouraged to continue these exercises.  She was advised to notify us if she develops any new or worsening symptoms.  She will follow-up in the office in 6 months.  De Quervain's tenosynovitis, right: Resolved.    Primary osteoarthritis of both knees: She has good range of motion of both knee joints on exam.  No warmth or effusion was noted.  She has been walking for exercise on a regular basis.  Arthropathy of lumbar facet joint: She has occasional discomfort in her lower back.   Her discomfort has been improving since she has been walking on a regular basis for exercise.  She has not had any symptoms of radiculopathy recently.  Vitamin D deficiency: She will follow up in vitamin D 5,000 units by mouth daily.    Other medical conditions are listed as follows:   BMI 40.0-44.9, adult (HCC)  History of sleep apnea  Sickle cell trait (HCC)  Bipolar disorder with psychotic features (HCC)  Familial multiple lipoprotein-type hyperlipidemia  Benign essential hypertension  History of iron deficiency anemia  Type 2 diabetes mellitus without complication, without long-term current use of insulin (HCC)  Orders: No orders of the defined types were placed in this encounter.  No orders of the defined types were placed in this encounter.    Follow-Up Instructions: Return in about 6 months (around 07/06/2020) for Osteoarthritis.  Sherron Ales, PA-C  I examined and evaluated the patient with Sherron Ales PA. Patient symptoms are gradually improving after physical therapy. She was reassured to exercise on a regular basis. She had full range of motion on my examination today. The plan of care was discussed as noted above.  Pollyann Savoy, MD  Note - This record has been created using Animal nutritionist.  Chart creation errors have been sought, but may not always  have been located. Such creation errors do not reflect on  the standard of medical care.

## 2020-01-04 ENCOUNTER — Ambulatory Visit: Payer: No Typology Code available for payment source | Admitting: Rheumatology

## 2020-01-04 ENCOUNTER — Encounter: Payer: Self-pay | Admitting: Rheumatology

## 2020-01-04 ENCOUNTER — Other Ambulatory Visit: Payer: Self-pay

## 2020-01-04 VITALS — BP 148/90 | HR 91 | Resp 16 | Ht 62.0 in | Wt 245.4 lb

## 2020-01-04 DIAGNOSIS — E7849 Other hyperlipidemia: Secondary | ICD-10-CM

## 2020-01-04 DIAGNOSIS — Z862 Personal history of diseases of the blood and blood-forming organs and certain disorders involving the immune mechanism: Secondary | ICD-10-CM

## 2020-01-04 DIAGNOSIS — M170 Bilateral primary osteoarthritis of knee: Secondary | ICD-10-CM | POA: Diagnosis not present

## 2020-01-04 DIAGNOSIS — D573 Sickle-cell trait: Secondary | ICD-10-CM

## 2020-01-04 DIAGNOSIS — G8929 Other chronic pain: Secondary | ICD-10-CM

## 2020-01-04 DIAGNOSIS — Z8669 Personal history of other diseases of the nervous system and sense organs: Secondary | ICD-10-CM

## 2020-01-04 DIAGNOSIS — F319 Bipolar disorder, unspecified: Secondary | ICD-10-CM

## 2020-01-04 DIAGNOSIS — E559 Vitamin D deficiency, unspecified: Secondary | ICD-10-CM

## 2020-01-04 DIAGNOSIS — E119 Type 2 diabetes mellitus without complications: Secondary | ICD-10-CM

## 2020-01-04 DIAGNOSIS — M654 Radial styloid tenosynovitis [de Quervain]: Secondary | ICD-10-CM | POA: Diagnosis not present

## 2020-01-04 DIAGNOSIS — M47816 Spondylosis without myelopathy or radiculopathy, lumbar region: Secondary | ICD-10-CM

## 2020-01-04 DIAGNOSIS — I10 Essential (primary) hypertension: Secondary | ICD-10-CM

## 2020-01-04 DIAGNOSIS — M25511 Pain in right shoulder: Secondary | ICD-10-CM | POA: Diagnosis not present

## 2020-01-04 DIAGNOSIS — Z6841 Body Mass Index (BMI) 40.0 and over, adult: Secondary | ICD-10-CM

## 2020-01-10 ENCOUNTER — Encounter: Payer: Self-pay | Admitting: Family Medicine

## 2020-01-17 ENCOUNTER — Ambulatory Visit (INDEPENDENT_AMBULATORY_CARE_PROVIDER_SITE_OTHER): Payer: No Typology Code available for payment source | Admitting: Family Medicine

## 2020-01-17 ENCOUNTER — Encounter: Payer: Self-pay | Admitting: Family Medicine

## 2020-01-17 ENCOUNTER — Other Ambulatory Visit: Payer: Self-pay | Admitting: Family Medicine

## 2020-01-17 ENCOUNTER — Other Ambulatory Visit: Payer: Self-pay

## 2020-01-17 VITALS — BP 140/82 | HR 84 | Temp 98.1°F | Resp 14 | Ht 62.0 in | Wt 241.0 lb

## 2020-01-17 DIAGNOSIS — E119 Type 2 diabetes mellitus without complications: Secondary | ICD-10-CM | POA: Diagnosis not present

## 2020-01-17 DIAGNOSIS — Z23 Encounter for immunization: Secondary | ICD-10-CM

## 2020-01-17 DIAGNOSIS — E669 Obesity, unspecified: Secondary | ICD-10-CM | POA: Diagnosis not present

## 2020-01-17 DIAGNOSIS — E559 Vitamin D deficiency, unspecified: Secondary | ICD-10-CM

## 2020-01-17 DIAGNOSIS — Z0001 Encounter for general adult medical examination with abnormal findings: Secondary | ICD-10-CM

## 2020-01-17 DIAGNOSIS — I10 Essential (primary) hypertension: Secondary | ICD-10-CM

## 2020-01-17 DIAGNOSIS — D509 Iron deficiency anemia, unspecified: Secondary | ICD-10-CM

## 2020-01-17 DIAGNOSIS — E66813 Obesity, class 3: Secondary | ICD-10-CM

## 2020-01-17 DIAGNOSIS — Z0000 Encounter for general adult medical examination without abnormal findings: Secondary | ICD-10-CM

## 2020-01-17 MED ORDER — FUSION PLUS PO CAPS: 1.0000 | ORAL_CAPSULE | Freq: Every day | ORAL | 5 refills | Status: DC

## 2020-01-17 MED ORDER — SAXENDA 18 MG/3ML ~~LOC~~ SOPN: 0.6000 mg | PEN_INJECTOR | Freq: Every day | SUBCUTANEOUS | 3 refills | Status: DC

## 2020-01-17 MED FILL — FUSION PLUS CAPSULE: 30 days supply | Qty: 30 | Fill #0

## 2020-01-17 MED FILL — SAXENDA 18 MG/3 ML PEN: 18 | 30 days supply | Qty: 15 | Fill #0

## 2020-01-17 NOTE — Progress Notes (Signed)
Subjective:    Patient ID: Kaylee Hill, female    DOB: Mar 04, 1981, 39 y.o.   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 Sherrie Mustache    Vitamin D def- taking vitamin d   Iron def anemia- taking fusion plus once a day   PAP Smear UTD March 2020   Mammogram  UTD in November   Vaccines- due for pneumonia vaccine   Melanee Spry- Eye doctor   Dentist- [facility] this week   Review Of Systems:  GEN- denies fatigue, fever, weight loss,weakness, recent illness HEENT- denies eye drainage, change in vision, nasal discharge, CVS- denies chest pain, palpitations RESP- denies SOB, cough, wheeze ABD- denies N/V, change in stools, abd pain GU- denies dysuria, hematuria, dribbling, incontinence MSK- denies joint pain, muscle aches, injury Neuro- denies headache, dizziness, syncope, seizure activity       Objective:    BP 140/82   Pulse 84   Temp 98.1 F (36.7 C) (Temporal)   Resp 14   Ht 5\' 2"  (1.575 m)   Wt 241 lb (109.3 kg)   LMP 01/03/2020 Comment: irregular  SpO2 97%   BMI 44.08 kg/m  GEN- NAD, alert and oriented x3 ,obese  HEENT- PERRL, EOMI, non injected sclera,  pink conjunctiva, MMM, oropharynx clear,TM clear no effusion, nares clear  Neck- Supple, no thyromegaly CVS- RRR, no murmur RESP-CTAB ABD-NABS,soft,NT,ND Psych- pleasant, normal affect and mood  EXT- No edema Pulses- Radial, DP- 2+        Assessment & Plan:      Problem List Items Addressed This Visit      Unprioritized   Benign essential hypertension    Pressure mildly elevated today.  We will continue to monitor at home no change in lisinopril      Relevant Orders   Lipid panel   Class 3 obesity    Despite following a pescetarian based Diet she is still overeating junk food fried foods.  She is working to reduce this.  Now working with a Clinical research associate.        Iron deficiency anemia, unspecified    Continue daily iron we will check levels today.      Relevant Medications   Iron-FA-B Cmp-C-Biot-Probiotic (FUSION PLUS) CAPS   Other Relevant Orders   Iron   Type 2 diabetes mellitus ([place])    Goal is A1c less than 6.5% we will likely get her to a normal A1c with the use of nutrition and weight loss. Continue metformin  She was not as consistent with Saxenda.  We will  try to get this preapproved by her insurance if not we will switch her to another GLP-1 such as Ozempic or Trulicity once a week  PNA vaccine given      Relevant Orders   Hemoglobin A1c   Lipid panel   Pneumococcal polysaccharide vaccine 23-valent greater than or equal to 2yo subcutaneous/IM (Completed)   Vitamin D deficiency   Relevant Orders   Vitamin D, 25-hydroxy    Other Visit Diagnoses    Routine general medical examination at a health care facility    -  Primary   Relevant Orders   Comprehensive metabolic panel   CBC with Differential/Platelet   Hemoglobin A1c   Pneumococcal polysaccharide vaccine 23-valent greater than or equal to 2yo subcutaneous/IM (Completed)   Need for prophylactic vaccination against Streptococcus pneumoniae (pneumococcus)       Relevant Orders   Pneumococcal  polysaccharide vaccine 23-valent greater than or equal to 2yo subcutaneous/IM (Completed)      Note: This dictation was prepared with Dragon dictation along with smaller phrase technology. Any transcriptional errors that result from this process are unintentional.

## 2020-01-17 NOTE — Assessment & Plan Note (Signed)
Despite following a pescetarian based Diet she is still overeating junk food fried foods.  She is working to reduce this.  Now working with a Psychologist, educational.

## 2020-01-17 NOTE — Assessment & Plan Note (Signed)
Continue daily iron we will check levels today.

## 2020-01-17 NOTE — Patient Instructions (Signed)
F/U  3 months 

## 2020-01-17 NOTE — Assessment & Plan Note (Addendum)
Goal is A1c less than 6.5% we will likely get her to a normal A1c with the use of nutrition and weight loss. Continue metformin  She was not as consistent with Saxenda.  We will try to get this preapproved by her insurance if not we will switch her to another GLP-1 such as Ozempic or Trulicity once a week  PNA vaccine given

## 2020-01-17 NOTE — Assessment & Plan Note (Signed)
Pressure mildly elevated today.  We will continue to monitor at home no change in lisinopril

## 2020-01-18 LAB — VITAMIN D 25 HYDROXY (VIT D DEFICIENCY, FRACTURES): Vit D, 25-Hydroxy: 17 ng/mL — ABNORMAL LOW (ref 30–100)

## 2020-01-18 LAB — CBC WITH DIFFERENTIAL/PLATELET
Absolute Monocytes: 618 cells/uL (ref 200–950)
Basophils Absolute: 33 cells/uL (ref 0–200)
Basophils Relative: 0.5 %
Eosinophils Absolute: 130 cells/uL (ref 15–500)
Eosinophils Relative: 2 %
HCT: 37.4 % (ref 35.0–45.0)
Hemoglobin: 11.9 g/dL (ref 11.7–15.5)
Lymphs Abs: 2340 cells/uL (ref 850–3900)
MCH: 24.6 pg — ABNORMAL LOW (ref 27.0–33.0)
MCHC: 31.8 g/dL — ABNORMAL LOW (ref 32.0–36.0)
MCV: 77.4 fL — ABNORMAL LOW (ref 80.0–100.0)
MPV: 10.2 fL (ref 7.5–12.5)
Monocytes Relative: 9.5 %
Neutro Abs: 3380 cells/uL (ref 1500–7800)
Neutrophils Relative %: 52 %
Platelets: 287 10*3/uL (ref 140–400)
RBC: 4.83 10*6/uL (ref 3.80–5.10)
RDW: 16.1 % — ABNORMAL HIGH (ref 11.0–15.0)
Total Lymphocyte: 36 %
WBC: 6.5 10*3/uL (ref 3.8–10.8)

## 2020-01-18 LAB — COMPREHENSIVE METABOLIC PANEL
AG Ratio: 1.2 (calc) (ref 1.0–2.5)
ALT: 13 U/L (ref 6–29)
AST: 17 U/L (ref 10–30)
Albumin: 4 g/dL (ref 3.6–5.1)
Alkaline phosphatase (APISO): 73 U/L (ref 31–125)
BUN: 8 mg/dL (ref 7–25)
CO2: 27 mmol/L (ref 20–32)
Calcium: 9.2 mg/dL (ref 8.6–10.2)
Chloride: 102 mmol/L (ref 98–110)
Creat: 0.62 mg/dL (ref 0.50–1.10)
Globulin: 3.3 g/dL (calc) (ref 1.9–3.7)
Glucose, Bld: 113 mg/dL — ABNORMAL HIGH (ref 65–99)
Potassium: 4.3 mmol/L (ref 3.5–5.3)
Sodium: 136 mmol/L (ref 135–146)
Total Bilirubin: 0.4 mg/dL (ref 0.2–1.2)
Total Protein: 7.3 g/dL (ref 6.1–8.1)

## 2020-01-18 LAB — HEMOGLOBIN A1C
Hgb A1c MFr Bld: 6.1 % of total Hgb — ABNORMAL HIGH (ref <5.7)
Mean Plasma Glucose: 128 (calc)
eAG (mmol/L): 7.1 (calc)

## 2020-01-18 LAB — LIPID PANEL
Cholesterol: 187 mg/dL (ref <200)
HDL: 33 mg/dL — ABNORMAL LOW (ref >=50)
LDL Cholesterol (Calc): 122 mg/dL (calc) — ABNORMAL HIGH
Non-HDL Cholesterol (Calc): 154 mg/dL (calc) — ABNORMAL HIGH (ref <130)
Total CHOL/HDL Ratio: 5.7 (calc) — ABNORMAL HIGH (ref <5.0)
Triglycerides: 204 mg/dL — ABNORMAL HIGH (ref <150)

## 2020-01-18 LAB — IRON: Iron: 47 ug/dL (ref 40–190)

## 2020-01-20 ENCOUNTER — Other Ambulatory Visit: Payer: Self-pay | Admitting: *Deleted

## 2020-01-20 MED ORDER — VITAMIN D (ERGOCALCIFEROL) 1.25 MG (50000 UNIT) PO CAPS
50000.0000 [IU] | ORAL_CAPSULE | ORAL | 0 refills | Status: DC
Start: 2020-01-20 — End: 2020-10-12

## 2020-01-20 MED FILL — VIT D2 1.25 MG (50,000 UNIT: 1.25 MG | 84 days supply | Qty: 12 | Fill #0

## 2020-01-21 MED FILL — LISINOPRIL 10 MG TABS: 10 | 30 days supply | Qty: 30 | Fill #2

## 2020-01-21 MED FILL — LATUDA 40 MG TABLET: 40 | 30 days supply | Qty: 30 | Fill #0

## 2020-02-07 ENCOUNTER — Encounter: Payer: Self-pay | Admitting: Rheumatology

## 2020-02-07 DIAGNOSIS — G8929 Other chronic pain: Secondary | ICD-10-CM

## 2020-02-08 NOTE — Telephone Encounter (Signed)
Patient returned call to the office. Patient advised Dr.Deveshwar recommends referral to Orthopedics. Referral placed.

## 2020-02-08 NOTE — Telephone Encounter (Signed)
I returned patient's call but there was no response.  Please call patient to discuss referral to orthopedics.

## 2020-02-15 ENCOUNTER — Ambulatory Visit (INDEPENDENT_AMBULATORY_CARE_PROVIDER_SITE_OTHER): Payer: No Typology Code available for payment source

## 2020-02-15 ENCOUNTER — Ambulatory Visit (INDEPENDENT_AMBULATORY_CARE_PROVIDER_SITE_OTHER): Payer: No Typology Code available for payment source | Admitting: Orthopaedic Surgery

## 2020-02-15 ENCOUNTER — Encounter: Payer: Self-pay | Admitting: Orthopaedic Surgery

## 2020-02-15 VITALS — Ht 62.0 in | Wt 240.0 lb

## 2020-02-15 DIAGNOSIS — M25511 Pain in right shoulder: Secondary | ICD-10-CM

## 2020-02-15 DIAGNOSIS — G8929 Other chronic pain: Secondary | ICD-10-CM

## 2020-02-15 NOTE — Progress Notes (Signed)
Office Visit Note   Patient: Kaylee Hill           Date of Birth: November 29, 1980           MRN: [id number] Visit Date: 02/15/2020              Requested by: Kaylee Savoy, MD [address] [place],  [place] [address] PCP: Kaylee Scarlet, MD   Assessment & Plan: Visit Diagnoses:  1. Chronic right shoulder pain     Plan: Impression is chronic right shoulder pain.  X-rays are not all that impressive.  Based on our discussion I have recommended a glenohumeral injection which she agreed to.  Patient instructed to follow-up if she does not notice any improvement from this.  Follow-Up Instructions: Return if symptoms worsen or fail to improve.   Orders:  Orders Placed This Encounter  Procedures  . XR Shoulder Right   No orders of the defined types were placed in this encounter.     Procedures: No procedures performed   Clinical Data: No additional findings.   Subjective: Chief Complaint  Patient presents with  . Right Shoulder - Pain    Kaylee Hill is 39 year old female comes in for evaluation of chronic right shoulder pain for years.  She feels like she woke up 1 morning and her shoulder from lying down.  Since then she has had pain all over her shoulder that radiates into the scapula.  Her range of motion is good.  She has done physical therapy without relief.  She has tried over-the-counter as well as prescription NSAIDs without much relief.  Denies any true radicular symptoms.  She was recently worked up by Kaylee Hill for rheumatoid arthritis which was negative.   Review of Systems  Constitutional: Negative.   HENT: Negative.   Eyes: Negative.   Respiratory: Negative.   Cardiovascular: Negative.   Endocrine: Negative.   Musculoskeletal: Negative.   Neurological: Negative.   Hematological: Negative.   Psychiatric/Behavioral: Negative.   All other systems reviewed and are negative.    Objective: Vital Signs: Ht 5\' 2"  (1.575 m)   Wt 240 lb  (108.9 kg)   BMI 43.90 kg/m   Physical Exam Vitals and nursing note reviewed.  Constitutional:      Appearance: She is well-developed.  HENT:     Head: Normocephalic and atraumatic.  Pulmonary:     Effort: Pulmonary effort is normal.  Abdominal:     Palpations: Abdomen is soft.  Musculoskeletal:     Cervical back: Neck supple.  Skin:    General: Skin is warm.     Capillary Refill: Capillary refill takes less than 2 seconds.  Neurological:     Mental Status: She is alert and oriented to person, place, and time.  Psychiatric:        Behavior: Behavior normal.        Thought Content: Thought content normal.        Judgment: Judgment normal.     Ortho Exam Right shoulder shows full range of motion with mild discomfort at the extremes.  Manual muscle testing is normal.  She has mild tenderness throughout her shoulder.  Negative Spurling's. Specialty Comments:  No specialty comments available.  Imaging: XR Shoulder Right  Result Date: 02/15/2020 Mild AC joint arthrosis.  No acute abnormalities.    PMFS History: Patient Active Problem List   Diagnosis Date Noted  . Arthropathy of lumbar facet joint 06/01/2019  . Primary osteoarthritis of both knees 06/01/2019  .  Vitamin D deficiency 08/12/2018  . Iron deficiency anemia, unspecified 08/07/2018  . Class 3 obesity 05/20/2018  . Type 2 diabetes mellitus ([place]) 05/01/2017  . Bipolar disorder with psychotic features ([place]) 04/28/2017  . Sickle cell trait ([place]) 12/09/2014  . Benign essential hypertension 04/09/2013  . Familial multiple lipoprotein-type hyperlipidemia 04/09/2013   Past Medical History:  Diagnosis Date  . Anxiety   . Bipolar 1 disorder ([place])   . Depression   . Gestational diabetes   . Hypertension     Family History  Problem Relation Age of Onset  . Bipolar disorder Mother   . Diabetes Father   . Hypertension Father   . Schizophrenia Father   . Asthma Brother   . Healthy Daughter   . Diabetes  Maternal Grandmother   . Cancer Maternal Grandmother        breast, pancreatic cancer  . Dementia Maternal Grandfather     Past Surgical History:  Procedure Laterality Date  . BREAST SURGERY     Social History   Occupational History  . Occupation: Marine scientist: [employer]  Tobacco Use  . Smoking status: Former Smoker    Packs/day: 0.10    Years: 1.00    Pack years: 0.10    Types: Cigarettes    Quit date: 2000    Years since quitting: 21.4  . Smokeless tobacco: Never Used  Vaping Use  . Vaping Use: Never used  Substance and Sexual Activity  . Alcohol use: No  . Drug use: No  . Sexual activity: Yes    Birth control/protection: Implant

## 2020-02-17 ENCOUNTER — Ambulatory Visit: Payer: No Typology Code available for payment source | Admitting: Orthopaedic Surgery

## 2020-02-21 ENCOUNTER — Ambulatory Visit: Payer: Self-pay

## 2020-02-21 ENCOUNTER — Ambulatory Visit (INDEPENDENT_AMBULATORY_CARE_PROVIDER_SITE_OTHER): Payer: No Typology Code available for payment source | Admitting: Family Medicine

## 2020-02-21 ENCOUNTER — Other Ambulatory Visit: Payer: Self-pay

## 2020-02-21 ENCOUNTER — Encounter: Payer: Self-pay | Admitting: Family Medicine

## 2020-02-21 DIAGNOSIS — M25511 Pain in right shoulder: Secondary | ICD-10-CM | POA: Diagnosis not present

## 2020-02-21 DIAGNOSIS — G8929 Other chronic pain: Secondary | ICD-10-CM | POA: Diagnosis not present

## 2020-02-21 NOTE — Progress Notes (Signed)
Subjective: Patient is here for ultrasound-guided intra-articular right glenohumeral injection.  She has pain in the anterior and posterior shoulder.  Objective: Decreased overhead reach.  Tender trigger point in the rhomboid area.  Procedure: Ultrasound-guided right glenohumeral injection: After sterile prep with Betadine, injected 8 cc 1% lidocaine without epinephrine and 40 mg methylprednisolone using a 22-gauge spinal needle, passing the needle from posterior approach into the glenohumeral joint.  Injectate seen filling the joint capsule.  Not a lot of immediate relief.  She also has vitamin D deficiency which the deficiency which is being treated.  This could contribute to myofascial pain.  She will follow-up as directed.

## 2020-03-15 ENCOUNTER — Encounter: Payer: Self-pay | Admitting: Family Medicine

## 2020-03-17 ENCOUNTER — Encounter: Payer: Self-pay | Admitting: Family Medicine

## 2020-03-17 ENCOUNTER — Ambulatory Visit (INDEPENDENT_AMBULATORY_CARE_PROVIDER_SITE_OTHER): Payer: No Typology Code available for payment source | Admitting: Family Medicine

## 2020-03-17 ENCOUNTER — Other Ambulatory Visit: Payer: Self-pay

## 2020-03-17 VITALS — BP 130/76 | HR 92 | Temp 98.3°F | Resp 14 | Ht 62.0 in | Wt 232.0 lb

## 2020-03-17 DIAGNOSIS — J209 Acute bronchitis, unspecified: Secondary | ICD-10-CM

## 2020-03-17 DIAGNOSIS — J0100 Acute maxillary sinusitis, unspecified: Secondary | ICD-10-CM | POA: Diagnosis not present

## 2020-03-17 MED ORDER — HYDROCOD POLST-CPM POLST ER 10-8 MG/5ML PO SUER: 5.0000 mL | Freq: Two times a day (BID) | ORAL | 0 refills | Status: DC | PRN

## 2020-03-17 MED ORDER — PREDNISONE 20 MG PO TABS
20.0000 mg | ORAL_TABLET | Freq: Every day | ORAL | 0 refills | Status: DC
Start: 2020-03-17 — End: 2020-04-17

## 2020-03-17 MED ORDER — AMOXICILLIN 875 MG PO TABS
875.0000 mg | ORAL_TABLET | Freq: Two times a day (BID) | ORAL | 0 refills | Status: DC
Start: 2020-03-17 — End: 2020-04-17

## 2020-03-17 MED FILL — HYDROCODONE-CHLORPHEN ER SU: 10-8 | 10 days supply | Qty: 100 | Fill #0

## 2020-03-17 MED FILL — predniSONE 20 MG TABS: 20 | 5 days supply | Qty: 5 | Fill #0

## 2020-03-17 MED FILL — AMOXICILLIN 875 MG TABS: 875 | 10 days supply | Qty: 20 | Fill #0

## 2020-03-17 NOTE — Progress Notes (Signed)
   Subjective:    Patient ID: Kaylee Hill, female    DOB: Jan 13, 1981, 39 y.o.   MRN: [id number]  Patient presents for Cough (x2 weeks- intermittent cough with clight production at times, no SOB, no chest pain)   Pt here with cough for the past few weeks. initially She was cleaning out her home- deep clean, slept under vent that night. THe next day ha nasal congestion loss of taste or smell, then cough set in.  She does get some sinus infections Has had COVID-19 vaccine She then starting having progressive coughing fits after exerting herself. Had small amounts of  brownish yellow phlegm coming up. Evening drinking or eating would trigger a ough. She has taken Sudafed,she took claritin which helped dry up the sinus infection She has not tried cough medicine She does still has a little congestion in right nares, no sore throat, no ear pain Husband had URI symptoms a few days, cleared up with claritin  Occ feels a wheezing in chest  Does not tolerate Guaifenesin     Review Of Systems:  GEN- denies fatigue, fever, weight loss,weakness, recent illness HEENT- denies eye drainage, change in vision, +nasal discharge, CVS- denies chest pain, palpitations RESP- denies SOB, +cough, wheeze ABD- denies N/V, change in stools, abd pain GU- denies dysuria, hematuria, dribbling, incontinence MSK- denies joint pain, muscle aches, injury Neuro- denies headache, dizziness, syncope, seizure activity       Objective:    BP 130/76   Pulse 92   Temp 98.3 F (36.8 C) (Temporal)   Resp 14   Ht 5\' 2"  (1.575 m)   Wt 232 lb (105.2 kg)   SpO2 97%   BMI 42.43 kg/m  GEN- NAD, alert and oriented x3 HEENT- PERRL, EOMI, non injected sclera, pink conjunctiva, MMM, oropharynx clear, TM clear bilat no effusion,  no maxillary sinus tenderness,+ inflammed turbinates,  Nasal drainage  Neck- Supple, no LAD CVS- RRR, no murmur RESP-CTAB EXT- No edema Pulses- Radial 2+         Assessment & Plan:       Problem List Items Addressed This Visit    None    Visit Diagnoses    Acute non-recurrent maxillary sinusitis    -  Primary   Sinusitis with inflammaed turbinates, leading to bronchitic cough, cover with amoxicillin, nasal saline, tussionex for coughh, prednisone low dose 2/2 DM Oxygen sat are normal, at this point she has been vaccinated and > 2 weeks with symptoms so covid swab not done    Relevant Medications   amoxicillin (AMOXIL) 875 MG tablet   predniSONE (DELTASONE) 20 MG tablet   chlorpheniramine-HYDROcodone (TUSSIONEX PENNKINETIC ER) 10-8 MG/5ML SUER   Acute bronchitis, unspecified organism          Note: This dictation was prepared with Dragon dictation along with smaller phrase technology. Any transcriptional errors that result from this process are unintentional.

## 2020-03-17 NOTE — Patient Instructions (Signed)
Take amoxicillin  Prednisone burst for inflammation tussionex  F/U as previous

## 2020-03-27 ENCOUNTER — Encounter: Payer: Self-pay | Admitting: Orthopaedic Surgery

## 2020-04-11 MED FILL — FUSION PLUS CAPSULE: 30 days supply | Qty: 30 | Fill #1

## 2020-04-11 MED FILL — LATUDA 40 MG TABLET: 40 | 30 days supply | Qty: 30 | Fill #1

## 2020-04-17 ENCOUNTER — Other Ambulatory Visit: Payer: Self-pay

## 2020-04-17 ENCOUNTER — Encounter: Payer: Self-pay | Admitting: Family Medicine

## 2020-04-17 ENCOUNTER — Ambulatory Visit (INDEPENDENT_AMBULATORY_CARE_PROVIDER_SITE_OTHER): Payer: No Typology Code available for payment source | Admitting: Family Medicine

## 2020-04-17 VITALS — BP 140/90 | HR 83 | Temp 97.8°F | Ht 62.0 in | Wt 235.0 lb

## 2020-04-17 DIAGNOSIS — E669 Obesity, unspecified: Secondary | ICD-10-CM | POA: Diagnosis not present

## 2020-04-17 DIAGNOSIS — I10 Essential (primary) hypertension: Secondary | ICD-10-CM | POA: Diagnosis not present

## 2020-04-17 DIAGNOSIS — E785 Hyperlipidemia, unspecified: Secondary | ICD-10-CM

## 2020-04-17 DIAGNOSIS — E1169 Type 2 diabetes mellitus with other specified complication: Secondary | ICD-10-CM | POA: Diagnosis not present

## 2020-04-17 DIAGNOSIS — D509 Iron deficiency anemia, unspecified: Secondary | ICD-10-CM | POA: Diagnosis not present

## 2020-04-17 NOTE — Patient Instructions (Addendum)
Myfitness Pal to track your food intake  Increase your water F/U 3 months

## 2020-04-17 NOTE — Assessment & Plan Note (Signed)
Discussed using a tracker to monitor food intake

## 2020-04-17 NOTE — Progress Notes (Signed)
   Subjective:    Patient ID: Kaylee Hill, female    DOB: 1981-04-10, 39 y.o.   MRN: [id number]  Patient presents for Follow-up (3 months)  Here follow-up chronic medical problems.  Medications reviewed.  Hypertension she is taking her lisinopril as prescribed no side effects of the medication.  Blood pressure Typically controlled   Class III obesity last weight in July 232lbs  Back on 1.2 of Saxenda for the past 2 weeks  weight today is  235lbs but also ate [place] with tortillas ands salsa last night She recently moved so she is working full time and trying to get her home together   Iron Deficiency anemia she is on iron supplementation  Diabetes mellitus last A1c 6.1% in May she also elevated triglycerides 204.  DL is 425.  Currently treating with Metformin 500 mg twice a day along with Saxenda Due for urine microalbumin, due for eye exam She has not checked her CBG  Kaylee Hill -Eye Doctor     Vitamin D deficiency she is still on high-dose vitamin D weekly Review Of Systems:  GEN- denies fatigue, fever, weight loss,weakness, recent illness HEENT- denies eye drainage, change in vision, nasal discharge, CVS- denies chest pain, palpitations RESP- denies SOB, cough, wheeze ABD- denies N/V, change in stools, abd pain GU- denies dysuria, hematuria, dribbling, incontinence MSK- denies joint pain, muscle aches, injury Neuro- denies headache, dizziness, syncope, seizure activity       Objective:    BP 140/90   Pulse 83   Temp 97.8 F (36.6 C) (Temporal)   Ht 5\' 2"  (1.575 m)   Wt 235 lb (106.6 kg)   SpO2 98%   BMI 42.98 kg/m  GEN- NAD, alert and oriented x3 HEENT- PERRL, EOMI, non injected sclera, pink conjunctiva, MMM, oropharynx clear Neck- Supple, no thyromegaly CVS- RRR, no murmur RESP-CTAB ABD-NABS,soft,NT,ND EXT- No edema Pulses- Radial, DP- 2+        Assessment & Plan:      Problem List Items Addressed This Visit      Unprioritized    Benign essential hypertension - Primary    BP is elevated today, will monitor Continue lisinopril Reduce salts in diet       Relevant Orders   Lipid panel   Class 3 obesity    Discussed using a tracker to monitor food intake      Hyperlipidemia associated with type 2 diabetes mellitus (HCC)   Iron deficiency anemia, unspecified    Continue iron supplement       Type 2 diabetes mellitus (HCC)    Has been controlled, recheck labs Goal less than 7% Recheck lipids, TG elevated Continue saxenda which she recently restarted and metformin       Relevant Orders   CBC with Differential/Platelet   Comprehensive metabolic panel   Lipid panel   Hemoglobin A1c   Microalbumin / creatinine urine ratio   HM DIABETES FOOT EXAM (Completed)      Note: This dictation was prepared with Dragon dictation along with smaller phrase technology. Any transcriptional errors that result from this process are unintentional.

## 2020-04-17 NOTE — Assessment & Plan Note (Signed)
Has been controlled, recheck labs Goal less than 7% Recheck lipids, TG elevated Continue saxenda which she recently restarted and metformin

## 2020-04-17 NOTE — Assessment & Plan Note (Signed)
BP is elevated today, will monitor Continue lisinopril Reduce salts in diet

## 2020-04-17 NOTE — Assessment & Plan Note (Signed)
Continue iron supplement.

## 2020-04-18 LAB — LIPID PANEL
Cholesterol: 178 mg/dL (ref <200)
HDL: 30 mg/dL — ABNORMAL LOW (ref >=50)
LDL Cholesterol (Calc): 121 mg/dL (calc) — ABNORMAL HIGH
Non-HDL Cholesterol (Calc): 148 mg/dL (calc) — ABNORMAL HIGH (ref <130)
Total CHOL/HDL Ratio: 5.9 (calc) — ABNORMAL HIGH (ref <5.0)
Triglycerides: 158 mg/dL — ABNORMAL HIGH (ref <150)

## 2020-04-18 LAB — CBC WITH DIFFERENTIAL/PLATELET
Absolute Monocytes: 488 cells/uL (ref 200–950)
Basophils Absolute: 40 cells/uL (ref 0–200)
Basophils Relative: 0.6 %
Eosinophils Absolute: 158 cells/uL (ref 15–500)
Eosinophils Relative: 2.4 %
HCT: 37.7 % (ref 35.0–45.0)
Hemoglobin: 12 g/dL (ref 11.7–15.5)
Lymphs Abs: 2653 cells/uL (ref 850–3900)
MCH: 24.8 pg — ABNORMAL LOW (ref 27.0–33.0)
MCHC: 31.8 g/dL — ABNORMAL LOW (ref 32.0–36.0)
MCV: 78.1 fL — ABNORMAL LOW (ref 80.0–100.0)
MPV: 10.9 fL (ref 7.5–12.5)
Monocytes Relative: 7.4 %
Neutro Abs: 3260 cells/uL (ref 1500–7800)
Neutrophils Relative %: 49.4 %
Platelets: 292 10*3/uL (ref 140–400)
RBC: 4.83 10*6/uL (ref 3.80–5.10)
RDW: 15.4 % — ABNORMAL HIGH (ref 11.0–15.0)
Total Lymphocyte: 40.2 %
WBC: 6.6 10*3/uL (ref 3.8–10.8)

## 2020-04-18 LAB — COMPREHENSIVE METABOLIC PANEL
AG Ratio: 1.3 (calc) (ref 1.0–2.5)
ALT: 12 U/L (ref 6–29)
AST: 18 U/L (ref 10–30)
Albumin: 3.9 g/dL (ref 3.6–5.1)
Alkaline phosphatase (APISO): 76 U/L (ref 31–125)
BUN/Creatinine Ratio: 10 (calc) (ref 6–22)
BUN: 6 mg/dL — ABNORMAL LOW (ref 7–25)
CO2: 27 mmol/L (ref 20–32)
Calcium: 8.8 mg/dL (ref 8.6–10.2)
Chloride: 104 mmol/L (ref 98–110)
Creat: 0.6 mg/dL (ref 0.50–1.10)
Globulin: 2.9 g/dL (calc) (ref 1.9–3.7)
Glucose, Bld: 102 mg/dL — ABNORMAL HIGH (ref 65–99)
Potassium: 4.3 mmol/L (ref 3.5–5.3)
Sodium: 139 mmol/L (ref 135–146)
Total Bilirubin: 0.4 mg/dL (ref 0.2–1.2)
Total Protein: 6.8 g/dL (ref 6.1–8.1)

## 2020-04-18 LAB — MICROALBUMIN / CREATININE URINE RATIO
Creatinine, Urine: 96 mg/dL (ref 20–275)
Microalb Creat Ratio: 6 mcg/mg creat (ref <30)
Microalb, Ur: 0.6 mg/dL

## 2020-04-18 LAB — HEMOGLOBIN A1C
Hgb A1c MFr Bld: 6.8 % of total Hgb — ABNORMAL HIGH (ref <5.7)
Mean Plasma Glucose: 148 (calc)
eAG (mmol/L): 8.2 (calc)

## 2020-05-15 ENCOUNTER — Other Ambulatory Visit (HOSPITAL_COMMUNITY): Payer: Self-pay

## 2020-05-15 MED FILL — lamoTRIgine 100 MG TABS: 100 | 90 days supply | Qty: 180 | Fill #0

## 2020-05-15 MED FILL — traZODone HCL 50 MG TABS: 50 | 90 days supply | Qty: 180 | Fill #0

## 2020-05-15 MED FILL — LATUDA 40 MG TABLET: 40 | 90 days supply | Qty: 90 | Fill #0

## 2020-06-06 ENCOUNTER — Ambulatory Visit (INDEPENDENT_AMBULATORY_CARE_PROVIDER_SITE_OTHER): Payer: No Typology Code available for payment source | Admitting: Family Medicine

## 2020-06-06 ENCOUNTER — Encounter: Payer: Self-pay | Admitting: Family Medicine

## 2020-06-06 ENCOUNTER — Other Ambulatory Visit: Payer: Self-pay

## 2020-06-06 ENCOUNTER — Ambulatory Visit: Payer: Self-pay

## 2020-06-06 DIAGNOSIS — M25511 Pain in right shoulder: Secondary | ICD-10-CM | POA: Diagnosis not present

## 2020-06-06 DIAGNOSIS — G8929 Other chronic pain: Secondary | ICD-10-CM | POA: Diagnosis not present

## 2020-06-06 NOTE — Progress Notes (Signed)
Office Visit Note   Patient: Kaylee Hill           Date of Birth: [birth year]           MRN: [id number] Visit Date: 06/06/2020 Requested by: Salley Scarlet, MD [address] [place],  [place] [address] PCP: Salley Scarlet, MD  Subjective: No chief complaint on file.   HPI: She is here with persistent right shoulder pain.  Glenohumeral injection in June really did not help at all.  She has pain in the shoulder blade area with radiation into the arm and occasional tingling in the fourth and fifth fingers.  Sometimes she gets pain in the neck as well.  She is very frustrated by her chronic pain.              ROS:   All other systems were reviewed and are negative.  Objective: Vital Signs: There were no vitals taken for this visit.  Physical Exam:  General:  Alert and oriented, in no acute distress. Pulm:  Breathing unlabored. Psy:  Normal mood, congruent affect. Skin: No rash Right arm: She has some tenderness in the right-sided cervical paraspinous muscles.  She has a tender trigger point in the rhomboid region that seems to reproduce most of her pain.  Full range of motion of the shoulder with 5/5 upper extremity strength and 2+ DTRs.  Imaging: No results found.  Assessment & Plan: 1.  Chronic right shoulder pain, possibly myofascial but cannot rule out cervical radiculopathy. -Discussed options with her and elected to try a trigger point injection today.  If this does not help, then x-rays and MRI scan of the cervical spine.     Procedures: Right shoulder trigger point injection: After sterile prep with Betadine, injected 5 cc 1% lidocaine without epinephrine and 40 mg methylprednisolone into the rhomboid area trigger point.  She had modest improvement during the anesthetic phase.    PMFS History: Patient Active Problem List   Diagnosis Date Noted  . Hyperlipidemia associated with type 2 diabetes mellitus (HCC) 04/17/2020  . Arthropathy of lumbar facet joint  06/01/2019  . Primary osteoarthritis of both knees 06/01/2019  . Vitamin D deficiency 08/12/2018  . Iron deficiency anemia, unspecified 08/07/2018  . Class 3 obesity 05/20/2018  . Type 2 diabetes mellitus (HCC) 05/01/2017  . Bipolar disorder with psychotic features (HCC) 04/28/2017  . Sickle cell trait (HCC) 12/09/2014  . Benign essential hypertension 04/09/2013  . Familial multiple lipoprotein-type hyperlipidemia 04/09/2013   Past Medical History:  Diagnosis Date  . Anxiety   . Bipolar 1 disorder (HCC)   . Depression   . Gestational diabetes   . Hypertension     Family History  Problem Relation Age of Onset  . Bipolar disorder Mother   . Diabetes Father   . Hypertension Father   . Schizophrenia Father   . Asthma Brother   . Healthy Daughter   . Diabetes Maternal Grandmother   . Cancer Maternal Grandmother        breast, pancreatic cancer  . Dementia Maternal Grandfather     Past Surgical History:  Procedure Laterality Date  . BREAST SURGERY     Social History   Occupational History  . Occupation: Chief Executive Officer: [employer]  Tobacco Use  . Smoking status: Former Smoker    Packs/day: 0.10    Years: 1.00    Pack years: 0.10    Types: Cigarettes    Quit date:  2000    Years since quitting: 21.7  . Smokeless tobacco: Never Used  Vaping Use  . Vaping Use: Never used  Substance and Sexual Activity  . Alcohol use: No  . Drug use: No  . Sexual activity: Yes    Birth control/protection: Implant

## 2020-06-20 ENCOUNTER — Ambulatory Visit
Admission: EM | Admit: 2020-06-20 | Discharge: 2020-06-20 | Disposition: A | Discharge status: Home / Self Care | Payer: No Typology Code available for payment source | Attending: Emergency Medicine | Admitting: Emergency Medicine

## 2020-06-20 ENCOUNTER — Encounter: Payer: Self-pay | Admitting: Emergency Medicine

## 2020-06-20 ENCOUNTER — Encounter: Payer: Self-pay | Admitting: Family Medicine

## 2020-06-20 ENCOUNTER — Other Ambulatory Visit: Payer: Self-pay

## 2020-06-20 DIAGNOSIS — R079 Chest pain, unspecified: Secondary | ICD-10-CM

## 2020-06-20 NOTE — Discharge Instructions (Signed)
Please read handout attached for further recommendations. Important follow-up with primary care for further evaluation. Your EKG looked okay today. May trial OTC pain medications, heat or ice to see if this helps. Go to ER for worsening, persistent chest pain, difficulty breathing, lightheadedness or dizziness, vomiting, weakness.

## 2020-06-20 NOTE — ED Provider Notes (Signed)
[facility]    CSN: [id number] Arrival date & time: 06/20/20  1151      History   Chief Complaint Chief Complaint  Patient presents with  . Chest Pain    HPI Kaylee Hill is a 39 y.o. female  With history as below presenting for left-sided chest pain.  States it woke her from her sleep early this morning.  States it is worse with inspiration, though not consistent.  Has been intermittent without known exacerbating or alleviating factors.  Has not tried thing for this.  Denies history of cardiopulmonary disease.  Has not had previous episodes.  Called PCP who recommended urgent care for further evaluation.  Denying active chest pain in office.  Past Medical History:  Diagnosis Date  . Anxiety   . Bipolar 1 disorder (HCC)   . Depression   . Gestational diabetes   . Hypertension     Patient Active Problem List   Diagnosis Date Noted  . Hyperlipidemia associated with type 2 diabetes mellitus (HCC) 04/17/2020  . Arthropathy of lumbar facet joint 06/01/2019  . Primary osteoarthritis of both knees 06/01/2019  . Vitamin D deficiency 08/12/2018  . Iron deficiency anemia, unspecified 08/07/2018  . Class 3 obesity 05/20/2018  . Type 2 diabetes mellitus (HCC) 05/01/2017  . Bipolar disorder with psychotic features (HCC) 04/28/2017  . Sickle cell trait (HCC) 12/09/2014  . Benign essential hypertension 04/09/2013  . Familial multiple lipoprotein-type hyperlipidemia 04/09/2013    Past Surgical History:  Procedure Laterality Date  . BREAST SURGERY      OB History    Gravida  1   Para  1   Term  1   Preterm      AB      Living  1     SAB      TAB      Ectopic      Multiple      Live Births  1            Home Medications    Prior to Admission medications   Medication Sig Start Date End Date Taking? Authorizing Provider  b complex vitamins tablet Take 1 tablet by mouth daily.    [provider]  chlorpheniramine-HYDROcodone  (TUSSIONEX PENNKINETIC ER) 10-8 MG/5ML SUER Take 5 mLs by mouth every 12 (twelve) hours as needed. 03/17/20   Lockhart, Velna Hatchet, MD  Insulin Pen Needle (BD PEN NEEDLE MICRO U/F) 32G X 6 MM MISC Use as instructed to inject Saxenda SQ QD. 09/28/19   Salley Scarlet, MD  Iron-FA-B Cmp-C-Biot-Probiotic (FUSION PLUS) CAPS Take 1 capsule by mouth at bedtime. 01/17/20   Salley Scarlet, MD  lamoTRIgine (LAMICTAL) 100 MG tablet TAKE 2 TABLETS BY MOUTH AT BEDTIME. 03/03/19   Tapia, Leisa, PA-C  LATUDA 40 MG TABS tablet Take 40 mg by mouth daily. 11/29/19   [provider]  Liraglutide -Weight Management (SAXENDA) 18 MG/3ML SOPN Inject 0.1 mLs (0.6 mg total) into the skin daily. Titrate up as directed to 3mg  daily 01/17/20   01/19/20, MD  lisinopril (ZESTRIL) 10 MG tablet Take 1 tablet (10 mg total) by mouth daily. 09/17/19   09/19/19, MD  metFORMIN (GLUCOPHAGE) 500 MG tablet Take 1 tablet (500 mg total) by mouth 2 (two) times daily with a meal. Patient taking differently: Take 500 mg by mouth daily with breakfast.  08/18/19   08/20/19, MD  Multiple Vitamin (MULTIVITAMIN) tablet Take 1 tablet by mouth  daily.    [provider]  Omega-3 Fatty Acids (OMEGA 3 PO) Take by mouth daily. Patient not taking: Reported on 04/17/2020    [provider]  traZODone (DESYREL) 50 MG tablet Take 25-100 mg by mouth at bedtime as needed. 11/29/19   [provider]  Vitamin D, Ergocalciferol, (DRISDOL) 1.25 MG (50000 UNIT) CAPS capsule Take 1 capsule (50,000 Units total) by mouth every 7 (seven) days. x12 weeks. 01/20/20   Salley Scarlet, MD    Family History Family History  Problem Relation Age of Onset  . Bipolar disorder Mother   . Diabetes Father   . Hypertension Father   . Schizophrenia Father   . Asthma Brother   . Healthy Daughter   . Diabetes Maternal Grandmother   . Cancer Maternal Grandmother        breast, pancreatic cancer  . Dementia Maternal  Grandfather     Social History Social History   Tobacco Use  . Smoking status: Former Smoker    Packs/day: 0.10    Years: 1.00    Pack years: 0.10    Types: Cigarettes    Quit date: 2000    Years since quitting: 21.8  . Smokeless tobacco: Never Used  Vaping Use  . Vaping Use: Never used  Substance Use Topics  . Alcohol use: No  . Drug use: No     Allergies   Patient has no known allergies.   Review of Systems As per HPI   Physical Exam Triage Vital Signs ED Triage Vitals [06/20/20 1309]  Enc Vitals Group     BP (!) 138/100     Pulse Rate 86     Resp 18     Temp 98.7 F (37.1 C)     Temp Source Oral     SpO2 97 %     Weight      Height      Head Circumference      Peak Flow      Pain Score 6     Pain Loc      Pain Edu?      Excl. in GC?    No data found.  Updated Vital Signs BP (!) 138/100 (BP Location: Right Arm)   Pulse 86   Temp 98.7 F (37.1 C) (Oral)   Resp 18   SpO2 97%   Visual Acuity Right Eye Distance:   Left Eye Distance:   Bilateral Distance:    Right Eye Near:   Left Eye Near:    Bilateral Near:     Physical Exam Vitals reviewed.  Constitutional:      General: She is not in acute distress.    Appearance: She is normal weight. She is not ill-appearing.  HENT:     Head: Normocephalic and atraumatic.  Eyes:     General: No scleral icterus.       Right eye: No discharge.        Left eye: No discharge.     Extraocular Movements: Extraocular movements intact.     Pupils: Pupils are equal, round, and reactive to light.  Cardiovascular:     Rate and Rhythm: Normal rate and regular rhythm.     Pulses: Normal pulses.     Heart sounds: No murmur heard.   Pulmonary:     Effort: Pulmonary effort is normal. No respiratory distress.     Breath sounds: No wheezing.  Chest:     Chest wall: No tenderness.  Abdominal:  General: Abdomen is flat.     Palpations: Abdomen is soft.     Tenderness: There is no abdominal tenderness.  There is no guarding.  Musculoskeletal:     Cervical back: Normal range of motion and neck supple. No muscular tenderness.  Lymphadenopathy:     Cervical: No cervical adenopathy.  Skin:    General: Skin is warm.     Capillary Refill: Capillary refill takes less than 2 seconds.     Coloration: Skin is not jaundiced or pale.     Findings: No rash.  Neurological:     General: No focal deficit present.     Mental Status: She is alert and oriented to person, place, and time.  Psychiatric:        Mood and Affect: Mood normal.        Thought Content: Thought content normal.      UC Treatments / Results  Labs (all labs ordered are listed, but only abnormal results are displayed) Labs Reviewed - No data to display  EKG   Radiology No results found.  Procedures Procedures (including critical care time)  Medications Ordered in UC Medications - No data to display  Initial Impression / Assessment and Plan / UC Course  I have reviewed the triage vital signs and the nursing notes.  Pertinent labs & imaging results that were available during my care of the patient were reviewed by me and considered in my medical decision making (see chart for details).     Patient febrile, nontoxic, hemodynamically stable in office.  Blood pressure is elevated, the patient states she is nervous.  Did not take her blood pressure medications today.  EKG done office, reviewed by me compared to previous from 04/28/2017: NSR with ventricular 80 bpm.  No QTC prolongation, ST elevation or depression.  Waveforms stable in all leads: Nonacute.  Episodes of chest pain appear to be brief, paroxysmal and intermittent without systemic effect.  Will defer further evaluation and management as needed to PCP.  Low concern for acute process at this time.  Will treat supportively as below.  Return precautions discussed, pt verbalized understanding and is agreeable to plan. Final Clinical Impressions(s) / UC Diagnoses    Final diagnoses:  Chest pain, unspecified type     Discharge Instructions     Please read handout attached for further recommendations. Important follow-up with primary care for further evaluation. Your EKG looked okay today. May trial OTC pain medications, heat or ice to see if this helps. Go to ER for worsening, persistent chest pain, difficulty breathing, lightheadedness or dizziness, vomiting, weakness.    ED Prescriptions    None     PDMP not reviewed this encounter.   [place], [place], [place] 06/20/20 1337

## 2020-06-20 NOTE — Progress Notes (Deleted)
Office Visit Note  Patient: Kaylee Hill             Date of Birth: 1980/11/23           MRN: [id number]             PCP: Salley Scarlet, MD Referring: Salley Scarlet, MD Visit Date: 07/04/2020 Occupation: @GUAROCC @  Subjective:  No chief complaint on file.   History of Present Illness: Kaylee Hill is a 39 y.o. female ***   Activities of Daily Living:  Patient reports morning stiffness for *** {minute/hour:19697}.   Patient {ACTIONS;DENIES/REPORTS:21021675::"Denies"} nocturnal pain.  Difficulty dressing/grooming: {ACTIONS;DENIES/REPORTS:21021675::"Denies"} Difficulty climbing stairs: {ACTIONS;DENIES/REPORTS:21021675::"Denies"} Difficulty getting out of chair: {ACTIONS;DENIES/REPORTS:21021675::"Denies"} Difficulty using hands for taps, buttons, cutlery, and/or writing: {ACTIONS;DENIES/REPORTS:21021675::"Denies"}  No Rheumatology ROS completed.   PMFS History:  Patient Active Problem List   Diagnosis Date Noted  . Hyperlipidemia associated with type 2 diabetes mellitus (HCC) 04/17/2020  . Arthropathy of lumbar facet joint 06/01/2019  . Primary osteoarthritis of both knees 06/01/2019  . Vitamin D deficiency 08/12/2018  . Iron deficiency anemia, unspecified 08/07/2018  . Class 3 obesity 05/20/2018  . Type 2 diabetes mellitus (HCC) 05/01/2017  . Bipolar disorder with psychotic features (HCC) 04/28/2017  . Sickle cell trait (HCC) 12/09/2014  . Benign essential hypertension 04/09/2013  . Familial multiple lipoprotein-type hyperlipidemia 04/09/2013    Past Medical History:  Diagnosis Date  . Anxiety   . Bipolar 1 disorder (HCC)   . Depression   . Gestational diabetes   . Hypertension     Family History  Problem Relation Age of Onset  . Bipolar disorder Mother   . Diabetes Father   . Hypertension Father   . Schizophrenia Father   . Asthma Brother   . Healthy Daughter   . Diabetes Maternal Grandmother   . Cancer Maternal Grandmother        breast,  pancreatic cancer  . Dementia Maternal Grandfather    Past Surgical History:  Procedure Laterality Date  . BREAST SURGERY     Social History   Social History Narrative  . Not on file   Immunization History  Administered Date(s) Administered  . Hepatitis B, adult 07/21/2014  . Influenza Whole 08/02/2013, 05/17/2014  . Influenza,inj,Quad PF,6+ Mos 05/19/2015, 04/25/2017  . Influenza-Unspecified 08/02/2014  . Meningococcal Polysaccharide 07/21/2014  . PFIZER SARS-COV-2 Vaccination 11/01/2019, 12/02/2019  . Pneumococcal Polysaccharide-23 01/17/2020  . Tdap 04/09/2013, 04/18/2015     Objective: Vital Signs: There were no vitals taken for this visit.   Physical Exam   Musculoskeletal Exam: ***  CDAI Exam: CDAI Score: -- Patient Global: --; Provider Global: -- Swollen: --; Tender: -- Joint Exam 07/04/2020   No joint exam has been documented for this visit   There is currently no information documented on the homunculus. Go to the Rheumatology activity and complete the homunculus joint exam.  Investigation: No additional findings.  Imaging: No results found.  Recent Labs: Lab Results  Component Value Date   WBC 6.6 04/17/2020   HGB 12.0 04/17/2020   PLT 292 04/17/2020   NA 139 04/17/2020   K 4.3 04/17/2020   CL 104 04/17/2020   CO2 27 04/17/2020   GLUCOSE 102 (H) 04/17/2020   BUN 6 (L) 04/17/2020   CREATININE 0.60 04/17/2020   BILITOT 0.4 04/17/2020   ALKPHOS 85 04/27/2017   AST 18 04/17/2020   ALT 12 04/17/2020   PROT 6.8 04/17/2020   ALBUMIN 3.9 04/27/2017   CALCIUM 8.8 04/17/2020  GFRAA 128 03/10/2019    Speciality Comments: No specialty comments available.  Procedures:  No procedures performed Allergies: Patient has no known allergies.   Assessment / Plan:     Visit Diagnoses: No diagnosis found.  Orders: No orders of the defined types were placed in this encounter.  No orders of the defined types were placed in this  encounter.   Face-to-face time spent with patient was *** minutes. Greater than 50% of time was spent in counseling and coordination of care.  Follow-Up Instructions: No follow-ups on file.   Gearldine Bienenstock, PA-C  Note - This record has been created using Dragon software.  Chart creation errors have been sought, but may not always  have been located. Such creation errors do not reflect on  the standard of medical care.

## 2020-06-20 NOTE — ED Triage Notes (Signed)
Pt here for left sided pain in chest with inspiration and palpation that is intermittent starting last night

## 2020-06-23 ENCOUNTER — Encounter: Payer: Self-pay | Admitting: Family Medicine

## 2020-06-23 DIAGNOSIS — G8929 Other chronic pain: Secondary | ICD-10-CM

## 2020-06-29 ENCOUNTER — Other Ambulatory Visit: Payer: Self-pay | Admitting: Family Medicine

## 2020-06-29 MED FILL — traZODone HCL 50 MG TABS: 50 | 90 days supply | Qty: 180 | Fill #0

## 2020-06-29 MED FILL — LATUDA 40 MG TABLET: 40 | 30 days supply | Qty: 30 | Fill #0

## 2020-06-29 MED FILL — LAMOTRIGINE 100 MG TABS: 100 | 90 days supply | Qty: 180 | Fill #0

## 2020-06-29 MED FILL — LISINOPRIL 10 MG TABS: 10 | 30 days supply | Qty: 30 | Fill #0

## 2020-07-04 ENCOUNTER — Ambulatory Visit: Payer: No Typology Code available for payment source | Admitting: Rheumatology

## 2020-07-04 ENCOUNTER — Other Ambulatory Visit: Payer: Self-pay | Admitting: Family Medicine

## 2020-07-04 DIAGNOSIS — M47816 Spondylosis without myelopathy or radiculopathy, lumbar region: Secondary | ICD-10-CM

## 2020-07-04 DIAGNOSIS — E559 Vitamin D deficiency, unspecified: Secondary | ICD-10-CM

## 2020-07-04 DIAGNOSIS — E7849 Other hyperlipidemia: Secondary | ICD-10-CM

## 2020-07-04 DIAGNOSIS — Z8669 Personal history of other diseases of the nervous system and sense organs: Secondary | ICD-10-CM

## 2020-07-04 DIAGNOSIS — Z862 Personal history of diseases of the blood and blood-forming organs and certain disorders involving the immune mechanism: Secondary | ICD-10-CM

## 2020-07-04 DIAGNOSIS — Z1231 Encounter for screening mammogram for malignant neoplasm of breast: Secondary | ICD-10-CM

## 2020-07-04 DIAGNOSIS — D573 Sickle-cell trait: Secondary | ICD-10-CM

## 2020-07-04 DIAGNOSIS — M654 Radial styloid tenosynovitis [de Quervain]: Secondary | ICD-10-CM

## 2020-07-04 DIAGNOSIS — G8929 Other chronic pain: Secondary | ICD-10-CM

## 2020-07-04 DIAGNOSIS — E119 Type 2 diabetes mellitus without complications: Secondary | ICD-10-CM

## 2020-07-04 DIAGNOSIS — M170 Bilateral primary osteoarthritis of knee: Secondary | ICD-10-CM

## 2020-07-04 DIAGNOSIS — F319 Bipolar disorder, unspecified: Secondary | ICD-10-CM

## 2020-07-04 DIAGNOSIS — I10 Essential (primary) hypertension: Secondary | ICD-10-CM

## 2020-07-04 MED FILL — METFORMIN HCL 500 MG TABS: 500 | 90 days supply | Qty: 180 | Fill #0

## 2020-07-14 ENCOUNTER — Other Ambulatory Visit: Payer: No Typology Code available for payment source

## 2020-07-18 ENCOUNTER — Ambulatory Visit: Payer: No Typology Code available for payment source | Admitting: Family Medicine

## 2020-07-25 ENCOUNTER — Ambulatory Visit
Admission: RE | Admit: 2020-07-25 | Discharge: 2020-07-25 | Disposition: A | Discharge status: Home / Self Care | Payer: No Typology Code available for payment source | Source: Ambulatory Visit | Attending: Family Medicine | Admitting: Family Medicine

## 2020-07-25 ENCOUNTER — Other Ambulatory Visit: Payer: Self-pay

## 2020-07-25 DIAGNOSIS — G8929 Other chronic pain: Secondary | ICD-10-CM

## 2020-07-25 DIAGNOSIS — M25511 Pain in right shoulder: Secondary | ICD-10-CM

## 2020-07-26 ENCOUNTER — Telehealth: Payer: Self-pay | Admitting: Family Medicine

## 2020-07-26 DIAGNOSIS — R519 Headache, unspecified: Secondary | ICD-10-CM

## 2020-07-26 DIAGNOSIS — G8929 Other chronic pain: Secondary | ICD-10-CM

## 2020-07-26 NOTE — Addendum Note (Signed)
Addended by: Lillia Carmel on: 07/26/2020 09:48 AM   Modules accepted: Orders

## 2020-07-26 NOTE — Telephone Encounter (Signed)
MRI shows arthritis in several of the joints in the neck, as well as a bulging disc at the C6-7 level.  No nerves are being compressed.  No indication for surgery based on these results.  Radiologist also notes the possibility of "idiopathic intracranial hypertension".  Do you have any issues with frequent headaches, visual disturbance, or other problems?

## 2020-08-17 MED FILL — METFORMIN HCL 500 MG TABS: 500 | 90 days supply | Qty: 180 | Fill #0

## 2020-08-17 MED FILL — FUSION PLUS CAPSULE: 30 days supply | Qty: 30 | Fill #0

## 2020-09-28 ENCOUNTER — Encounter: Payer: Self-pay | Admitting: Family Medicine

## 2020-09-29 ENCOUNTER — Telehealth: Payer: Self-pay | Admitting: *Deleted

## 2020-09-29 NOTE — Telephone Encounter (Signed)
Received request from pharmacy for PA on Saxenda.   PA submitted.   Dx: E66.01- obesity.  [employer] is reviewing your PA request. You may close this dialog, return to your dashboard, and perform other tasks.  To check for an update later, open this request again from your dashboard. If [employer] has not replied within 24 hours for urgent requests or within 48 hours for standard requests, please contact [employer] at [phone].

## 2020-10-02 ENCOUNTER — Other Ambulatory Visit: Payer: Self-pay | Admitting: Family Medicine

## 2020-10-02 MED ORDER — BD PEN NEEDLE MICRO U/F 32G X 6 MM MISC
1 refills | Status: DC
Start: 2020-10-02 — End: 2021-06-06

## 2020-10-02 MED ORDER — SAXENDA 18 MG/3ML ~~LOC~~ SOPN: 0.6000 mg | PEN_INJECTOR | Freq: Every day | SUBCUTANEOUS | 3 refills | Status: DC

## 2020-10-02 MED FILL — UNIFINE PENTIPS 6MM 31G: 31G X 6 MM | 90 days supply | Qty: 100 | Fill #0

## 2020-10-02 MED FILL — SAXENDA 18 MG/3 ML PEN: 18 | 30 days supply | Qty: 15 | Fill #0

## 2020-10-02 NOTE — Telephone Encounter (Signed)
Received PA determination.   PA [id number] approved 10/02/2020- 01/29/2021.

## 2020-10-04 ENCOUNTER — Institutional Professional Consult (permissible substitution): Payer: No Typology Code available for payment source | Admitting: Neurology

## 2020-10-10 ENCOUNTER — Telehealth: Payer: Self-pay

## 2020-10-10 NOTE — Telephone Encounter (Signed)
Spoke to pt, states she misplaced her cpap during her move, she will call to r/s cpap fu, she will keep her appt with MD for new concern

## 2020-10-11 ENCOUNTER — Ambulatory Visit: Payer: No Typology Code available for payment source | Admitting: Adult Health

## 2020-10-12 ENCOUNTER — Ambulatory Visit (INDEPENDENT_AMBULATORY_CARE_PROVIDER_SITE_OTHER): Payer: No Typology Code available for payment source | Admitting: Neurology

## 2020-10-12 ENCOUNTER — Encounter: Payer: Self-pay | Admitting: Neurology

## 2020-10-12 ENCOUNTER — Other Ambulatory Visit: Payer: Self-pay

## 2020-10-12 VITALS — BP 136/94 | HR 93 | Ht 62.0 in | Wt 220.0 lb

## 2020-10-12 DIAGNOSIS — G4733 Obstructive sleep apnea (adult) (pediatric): Secondary | ICD-10-CM

## 2020-10-12 DIAGNOSIS — E236 Other disorders of pituitary gland: Secondary | ICD-10-CM

## 2020-10-12 DIAGNOSIS — R519 Headache, unspecified: Secondary | ICD-10-CM | POA: Diagnosis not present

## 2020-10-12 DIAGNOSIS — H538 Other visual disturbances: Secondary | ICD-10-CM | POA: Diagnosis not present

## 2020-10-12 NOTE — Patient Instructions (Signed)
Please get restarted on your AutoPap machine.  Your headaches may correlate with untreated obstructive sleep apnea.  As you recall, you have severe obstructive sleep apnea.  Please get your AutoPap machine out of the storage as soon as possible and get restarted on your machine.  If you need new supplies, please talk to your DME company.  I will write for new supplies as well.  As discussed, we will proceed with a brain MRI with and without contrast as well as a referral to ophthalmology.  If you do have underlying increased fluid pressure on the brain called intracranial hypertension, we will have to proceed with additional testing and more closer follow-up but for now, I think it is imperative that you restart your treatment for sleep apnea.  Let us know if you have a preference regarding your ophthalmologist or if your previous practice has an ophthalmologist you can see.

## 2020-10-12 NOTE — Progress Notes (Signed)
Subjective:    Patient ID: Kaylee Hill is a 40 y.o. female.  HPI     Interim history:      Dear Dr. Junius Roads,  I saw your patient, [name], upon your kind request in my neurologic clinic today for initial consultation of her recurrent headaches and concern for intracranial hypertension.  The patient is unaccompanied today.  As you know, [name] is a 40 year old right-handed woman with an underlying medical history of hypertension, depression, anxiety, sickle cell trait, hyperlipidemia, iron deficiency, vitamin D deficiency, history of gestational diabetes, OSA, and morbid obesity with a BMI of over 40, who reports worsening headaches over the past few months.  She reports a bifrontal headache which feels like a pressure-like sensation, also viselike.  She does not typically have any nausea or vomiting, has had intermittent blurry vision in the right eye, typically only after rubbing the right eye and the blurriness persists for a minute or so.  Does not happen on the left side.  No vision loss, no double vision.  No one-sided weakness or numbness or tingling or droopy face or slurring of speech.  She has not been using her AutoPap for the past several months.  She reports that she moved and had the machine in storage and has not taken it out of storage.  Last usage of AutoPap therapy appears to be in June 2021 when we look on the website.  I have previously followed her for her obstructive sleep apnea.  Her home sleep test from May 2020 indicated severe obstructive sleep apnea and she was started on AutoPap therapy.  She was last seen for sleep apnea checkup on 10/11/2019, at which time she was compliant with her AutoPap.  She was working on weight loss.  I suggested an increase in her maximum AutoPap pressure to 15 cm at the time as her residual AHI was 4.2/h.    She had a recent cervical spine MRI without contrast on 07/25/2020 and I reviewed the results: IMPRESSION: Cervical spondylosis  as outlined.   No significant spinal canal stenosis at any level.   At C6-C7, a disc bulge and uncovertebral hypertrophy contribute to mild/moderate right neural foraminal narrowing.   Mild neural foraminal narrowing on the left at C3-C4 and C4-C5.   Nonspecific reversal of the expected cervical lordosis.   Partially empty sella turcica. This finding is very commonly incidental, but can be associated with idiopathic intracranial hypertension.    The patient's allergies, current medications, family history, past medical history, past social history, past surgical history and problem list were reviewed and updated as appropriate.      Previously:   10/11/19: She reports overall doing well, sometimes she has more fatigue, feels like perhaps the settings need to be changed.  She uses a full facemask, it covers the nose, she may have tried in the very beginning a under the nose type of full facemask and did not really like that style.  She would be willing to seek a mask refit.  She would be willing to try a slightly higher maximum AutoPap pressure.  She has been on Saxenda, Metformin was therefore reduced and she has restarted lisinopril per her PCP.  I saw her on 04/05/2019, at which time she had established treatment on AutoPap and was doing well.  She had noticed improvement in her sleep quality and daytime somnolence.    I reviewed her AutoPap compliance data from 09/08/2019 through 10/07/2019 which is a total of 30 days,  during which time she used her machine every night with percent use days greater than 4 hours at 97%, indicating excellent compliance with an average usage of 7 hours and 8 minutes, residual AHI at goal at 4.2/h, leak on the high side with a 95th percentile at 27.8 L/min, 95th percentile pressure is 13.9 cm, range of 7 to 14 cm.     I first met her on 12/16/2018 and virtual visit at the request of her primary care PA, at which time she reported snoring, excessive daytime  somnolence and witnessed apneas.  She was advised to proceed with a home sleep test.  She had this on 01/12/2019 which indicated severe sleep apnea with an AHI of 64.3/h, O2 nadir of 81%.  She was advised to proceed with treatment at home in the form of AutoPap therapy.   I reviewed her AutoPap compliance data from 03/01/2019 through 03/30/2019 which is a total of 30 days, during which time she used her AutoPap every night with percent used days greater than 4 hours at 100%, indicating superb compliance with an average usage of 6 hours and 30 minutes, residual AHI at goal at 2.5/h, 95th percentile of pressure at 13.9 cm, range of 7 cm to 14 cm, leak in the acceptable range with the 95th percentile at 16.5 L/min.     12/16/18: I am conducting a virtual, video based new patient visit via Webex in lieu of a face-to-face visit for evaluation of her sleep disorder, in particular, concern for underlying obstructive sleep apnea. The patient is unaccompanied today and joins via cell phone from home. She is referred by her primary care PA, Verline Lema, and I reviewed her note from 09/25/2018. Patient endorses snoring, witnessed apneas and excessive daytime somnolence. Her Epworth sleepiness score is 20 out of 24, fatigue severity score is 56 out of 63.  Note, she is on potentially sedating medications including Abilify, lamotrigine and a beta blocker, labetalol.she has been on Abilify for the past 5+ years, on Lamictal only for the past few months. 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 Bedtime is around midnight and rise time around 10. Her husband and her mother-in-law have witnessed breathing pauses while she is asleep. She has had infrequent episodes of sleep paralysis, happens maybe once every 3 months, has noticed this for the past few years, maybe as long as 7 years. Her snoring wakes her up at times and sometimes she has woken up with a sense of gasping for air.  She does not have recurrent morning headaches or night to night nocturia. She's not aware of any family history of OSA. She's not had a tonsillectomy. She has 4 dogs in the household, to our her mother-in-law's and 2 are affairs, they sleep on the floor in her bedroom. She lives with her husband, 78-year-old daughter, mother-in-law and sister-in-law. Her daughter sleeps in her own bed in their bedroom. She has a TV on at night for white noise. She does not drink caffeine on a daily basis secondary to feeling jittery from it. She does not utilize any alcohol currently and is a nonsmoker, she smoked one year between 1999 and 2000. Her mood is stable currently on her medication regimen, she is followed by mental health.  Her Past Medical History Is Significant For: Past Medical History:  Diagnosis Date  . Anxiety   . Bipolar 1 disorder ([place])   . Depression   . Gestational  diabetes   . Hypertension     Her Past Surgical History Is Significant For: Past Surgical History:  Procedure Laterality Date  . BREAST SURGERY      Her Family History Is Significant For: Family History  Problem Relation Age of Onset  . Bipolar disorder Mother   . Diabetes Father   . Hypertension Father   . Schizophrenia Father   . Asthma Brother   . Healthy Daughter   . Diabetes Maternal Grandmother   . Cancer Maternal Grandmother        breast, pancreatic cancer  . Dementia Maternal Grandfather     Her Social History Is Significant For: Social History   Socioeconomic History  . Marital status: Married    Spouse name: Not on file  . Number of children: 1  . Years of education: Not on file  . Highest education level: Some college, no degree  Occupational History  . Occupation: Marine scientist: [place]  Tobacco Use  . Smoking status: Former Smoker    Packs/day: 0.10    Years: 1.00    Pack years: 0.10    Types: Cigarettes    Quit date: 2000    Years since quitting: 22.1  . Smokeless  tobacco: Never Used  Vaping Use  . Vaping Use: Never used  Substance and Sexual Activity  . Alcohol use: No  . Drug use: No  . Sexual activity: Yes    Birth control/protection: Implant  Other Topics Concern  . Not on file  Social History Narrative  . Not on file   Social Determinants of Health   Financial Resource Strain: Not on file  Food Insecurity: Not on file  Transportation Needs: Not on file  Physical Activity: Not on file  Stress: Not on file  Social Connections: Not on file    Her Allergies Are:  No Known Allergies:   Her Current Medications Are:  Outpatient Encounter Medications as of 10/12/2020  Medication Sig  . Cyanocobalamin (VITAMIN B 12 PO) Take by mouth. Chew 2 per day  . Insulin Pen Needle (BD PEN NEEDLE MICRO U/F) 32G X 6 MM MISC Use as instructed to inject Saxenda SQ QD.  . Iron-FA-B Cmp-C-Biot-Probiotic (FUSION PLUS) CAPS Take 1 capsule by mouth at bedtime.  . lamoTRIgine (LAMICTAL) 100 MG tablet TAKE 2 TABLETS BY MOUTH AT BEDTIME.  Marland Kitchen LATUDA 40 MG TABS tablet Take 40 mg by mouth daily.  . Liraglutide -Weight Management (SAXENDA) 18 MG/3ML SOPN Inject 0.6 mg into the skin daily. Titrate up as directed to 30m daily  . lisinopril (ZESTRIL) 10 MG tablet Take 1 tablet (10 mg total) by mouth daily.  . metFORMIN (GLUCOPHAGE) 500 MG tablet TAKE 1 TABLET (500 MG TOTAL) BY MOUTH TWICE A DAY WITH A MEAL.  . Multiple Vitamin (MULTIVITAMIN) tablet Take by mouth daily. Chew 2 per day  . Omega-3 Fatty Acids (OMEGA 3 PO) Take by mouth daily. Gummy chew 2 per day  . traZODone (DESYREL) 50 MG tablet Take 25-100 mg by mouth at bedtime as needed.  . TURMERIC PO Take by mouth. With black pepper chew 2 per day  . VITAMIN D PO Take by mouth. Chew 2 per day  . [DISCONTINUED] b complex vitamins tablet Take 1 tablet by mouth daily.  . [DISCONTINUED] chlorpheniramine-HYDROcodone (TUSSIONEX PENNKINETIC ER) 10-8 MG/5ML SUER Take 5 mLs by mouth every 12 (twelve) hours as needed.  .  [DISCONTINUED] Vitamin D, Ergocalciferol, (DRISDOL) 1.25 MG (50000 UNIT) CAPS capsule  Take 1 capsule (50,000 Units total) by mouth every 7 (seven) days. x12 weeks.   No facility-administered encounter medications on file as of 10/12/2020.  :  Review of Systems:  Out of a complete 14 point review of systems, all are reviewed and negative with the exception of these symptoms as listed below: Review of Systems  Neurological:       Pt reports she is here to discuss worsening h/a. She sts she has sporadic h/a that can last 2-3 days at a time. She sts she will typically take Advil PRN with h/a is at its worst. She sts she was on cpap but has not used in a while. She reports her machine is somewhere in storage and she has not gotten it out yet since moving last year.     Objective:  Neurological Exam  Physical Exam Physical Examination:   Vitals:   10/12/20 1443  BP: (!) 136/94  Pulse: 93  SpO2: 98%    General Examination: The patient is a very pleasant 40 y.o. female in no acute distress. She appears well-developed and well-nourished and well groomed.   HEENT:Normocephalic, atraumatic, pupils are reactiveto light.  Funduscopic exam looks normal on the left, questionable hazy disc margins on the right.  Visual fields full by finger perimetric.  Corrective eyeglasses in place.  No photophobia. Extraocular tracking is good.Normal smooth pursuit is noted.Hearing is grossly intact. Face is symmetric with normal facial animation and normal facial sensation. Speech is clear with no dysarthria noted. There is no hypophonia. There is no lip, neck/head, jaw or voice tremor. Mild mouth dryness noted, otherwise oropharynx exam is stable.  Chest:Clear to auscultation without wheezing, rhonchi or crackles noted.  Heart:S1+S2+0,  questionable systolicmurmurnoted.   Abdomen:Soft, non-tender and non-distended with normal bowel sounds appreciated on auscultation.  Extremities:There  isnopitting edema in the distal lower extremities bilaterally.   Skin: Warm and dry without trophic changes noted.  Musculoskeletal: exam reveals no obvious joint deformities, tenderness or joint swelling or erythema.   Neurologically:  Mental status: The patient is awake, alert and oriented in all 4 spheres.Herimmediate and remote memory,attention, language skills and fund of knowledge are appropriate. There is no evidence of aphasia, agnosia, apraxia or anomia. Speech is clear with normal prosody and enunciation. Thought process is linear. Mood isnormaland affect is normal.  Cranial nerves II - XII are as described above under HEENT exam. Motor exam: Normal bulk, strength and tone is noted. There is no tremor. Romberg is negative. Fine motor skills and coordination:grosslyintact.  Cerebellar testing: No dysmetria or intention tremor. There is no truncal or gait ataxia.  Finger-to-nose and heel-to-shin unremarkable. Sensory exam: intact to light touch, temperature and vibration in the upper and lower extremities bilaterally, symmetrically.  Gait, station and balance:Shestands easily. No veering to one side is noted. No leaning to one side is noted. Posture is age-appropriate and stance is narrow based. Gait showsnormalstride length and normalpace. No problems turning are noted. Tandem walk is good.   Assessmentand Plan:  In summary,Shanda Smithis a very pleasant 10 year oldfemalewith an underlying medical history of hypertension, depression, anxiety, sickle cell trait, hyperlipidemia, iron deficiency, vitamin D deficiency, history of gestational diabetes, severe obstructive sleep apnea, recurrent headaches, and morbid obesity with a BMI of over 40, who presents for evaluation of her worsening headaches.  Of note, she is stopped using her AutoPap several months ago, last usage on the wet side appears to be in June of last year.  She reports that her  machine has been in storage  since they moved.  She is strongly advised to restart with her AutoPap machine as she has a history of severe obstructive sleep apnea as demonstrated by her home sleep test in May 2020.  She is advised to seek formal evaluation with an ophthalmologist for a dilated eye examination to look for papilledema.  We will proceed with a brain MRI with and without contrast to rule out a structural cause of her headaches.  Furthermore, we may proceed with a MR venogram of the head, we will await evaluation through ophthalmology.  She has a eye doctor that she goes to, she is due for an examination but is not sure if she has seen an ophthalmologist or if the practice has an ophthalmologist.  She will let us know if she has a preference, otherwise she is open to seeing a new ophthalmologist.  She is advised to follow-up in about 3 months, sooner if needed and we will keep her posted as to her brain MRI results and she will hopefully schedule soon with an ophthalmologist.  She is willing to restart her AutoPap therapy and I wrote for new supplies.  I answered all her questions today and she was in agreement.   I spent 40 minutes in total face-to-face time and in reviewing records during pre-charting, more than 50% of which was spent in counseling and coordination of care, reviewing test results, reviewing medications and treatment regimen and/or in discussing or reviewing the diagnosis of recurrent HAs, severe OSA, blurry vision, the prognosis and treatment options. Pertinent laboratory and imaging test results that were available during this visit with the patient were reviewed by me and considered in my medical decision making (see chart for details).

## 2020-10-13 LAB — COMPREHENSIVE METABOLIC PANEL
ALT: 13 IU/L (ref 0–32)
AST: 20 IU/L (ref 0–40)
Albumin/Globulin Ratio: 1.4 (ref 1.2–2.2)
Albumin: 4.2 g/dL (ref 3.8–4.8)
Alkaline Phosphatase: 92 IU/L (ref 44–121)
BUN/Creatinine Ratio: 16 (ref 9–23)
BUN: 8 mg/dL (ref 6–24)
Bilirubin Total: 0.2 mg/dL (ref 0.0–1.2)
CO2: 24 mmol/L (ref 20–29)
Calcium: 9.3 mg/dL (ref 8.7–10.2)
Chloride: 101 mmol/L (ref 96–106)
Creatinine, Ser: 0.5 mg/dL — ABNORMAL LOW (ref 0.57–1.00)
GFR calc Af Amer: 139 mL/min/{1.73_m2} (ref >59)
GFR calc non Af Amer: 121 mL/min/{1.73_m2} (ref >59)
Globulin, Total: 3.1 g/dL (ref 1.5–4.5)
Glucose: 81 mg/dL (ref 65–99)
Potassium: 4.4 mmol/L (ref 3.5–5.2)
Sodium: 139 mmol/L (ref 134–144)
Total Protein: 7.3 g/dL (ref 6.0–8.5)

## 2020-10-16 ENCOUNTER — Encounter: Payer: Self-pay | Admitting: Neurology

## 2020-10-16 NOTE — Progress Notes (Signed)
Blood chemistry panel showed no abnormality with her kidney function with the exception that she has a low creatinine level.  We typically worry about a high creatinine level.  Please remind her to get blood work done on a regular basis through her primary care physician but from our end it is okay to proceed with a brain MRI as planned with and without contrast.

## 2020-10-17 ENCOUNTER — Telehealth: Payer: Self-pay | Admitting: Neurology

## 2020-10-17 NOTE — Telephone Encounter (Signed)
LVM for pt to call back about scheduling mri  [facility] auth: [id number] (exp. 10/18/20 to 11/17/20)

## 2020-10-17 NOTE — Telephone Encounter (Signed)
Patient returned my call she is scheduled at [facility] for 10/24/20.

## 2020-10-24 ENCOUNTER — Ambulatory Visit: Payer: No Typology Code available for payment source

## 2020-10-24 DIAGNOSIS — H538 Other visual disturbances: Secondary | ICD-10-CM | POA: Diagnosis not present

## 2020-10-24 DIAGNOSIS — E236 Other disorders of pituitary gland: Secondary | ICD-10-CM

## 2020-10-24 DIAGNOSIS — R519 Headache, unspecified: Secondary | ICD-10-CM

## 2020-10-24 DIAGNOSIS — G4733 Obstructive sleep apnea (adult) (pediatric): Secondary | ICD-10-CM

## 2020-10-24 MED ORDER — GADOBUTROL 1 MMOL/ML IV SOLN
10.0000 mL | Freq: Once | INTRAVENOUS | Status: AC | PRN
Start: 2020-10-24 — End: 2020-10-24
  Administered 2020-10-24: 10 mL via INTRAVENOUS

## 2020-10-25 ENCOUNTER — Telehealth: Payer: Self-pay | Admitting: Neurology

## 2020-10-25 NOTE — Telephone Encounter (Signed)
Pt would very much like a call just as soon as the results are available.

## 2020-10-25 NOTE — Telephone Encounter (Signed)
MRI completed on 10/24/20, will update once results are available.

## 2020-10-26 ENCOUNTER — Telehealth: Payer: Self-pay | Admitting: *Deleted

## 2020-10-26 NOTE — Telephone Encounter (Signed)
Called patient and advised her that her brain MRI with and without contrast showed no acute findings and no new findings. Again, partially empty sella was seen. Advised we have sent the ophthalmology referral to Dr. Laruth Hill office, but the patient requested Dr. Luciana Hill. She stated that she is fine to see Dr Kaylee Hill. Patient verbalized understanding, appreciation.

## 2020-10-26 NOTE — Telephone Encounter (Signed)
error 

## 2020-10-26 NOTE — Progress Notes (Signed)
Please call patient and advise her that her brain MRI with and without contrast showed no acute findings and no new findings.  Again, partially empty sella was seen.  It looks like we have sent the ophthalmology referral to Dr. Laruth Bouchard office, but the patient requested Dr. Luciana Axe?  Please verify with patient that this is ok, and inform Annabelle Harman if we need to send a referral to Dr. Luciana Axe instead.

## 2020-11-17 ENCOUNTER — Encounter: Payer: Self-pay | Admitting: Neurology

## 2020-11-20 MED FILL — SAXENDA 18 MG/3 ML PEN: 18 | 30 days supply | Qty: 15 | Fill #0

## 2020-11-20 MED FILL — FUSION PLUS CAPSULE: 30 days supply | Qty: 30 | Fill #1

## 2020-11-20 MED FILL — LATUDA 40 MG TABLET: 40 | 30 days supply | Qty: 30 | Fill #1

## 2020-11-28 ENCOUNTER — Telehealth: Payer: Self-pay | Admitting: Family Medicine

## 2020-11-28 ENCOUNTER — Other Ambulatory Visit: Payer: Self-pay

## 2020-11-28 ENCOUNTER — Ambulatory Visit (INDEPENDENT_AMBULATORY_CARE_PROVIDER_SITE_OTHER): Payer: No Typology Code available for payment source | Admitting: Ophthalmology

## 2020-11-28 ENCOUNTER — Encounter (INDEPENDENT_AMBULATORY_CARE_PROVIDER_SITE_OTHER): Payer: Self-pay | Admitting: Ophthalmology

## 2020-11-28 DIAGNOSIS — H43823 Vitreomacular adhesion, bilateral: Secondary | ICD-10-CM

## 2020-11-28 DIAGNOSIS — E119 Type 2 diabetes mellitus without complications: Secondary | ICD-10-CM

## 2020-11-28 DIAGNOSIS — G4733 Obstructive sleep apnea (adult) (pediatric): Secondary | ICD-10-CM

## 2020-11-28 LAB — HM DIABETES EYE EXAM

## 2020-11-28 NOTE — Progress Notes (Signed)
11/28/2020     CHIEF COMPLAINT Patient presents for Blurred Vision (Pt referred for papilledema by Dr. Ladell Heads Athar/Pt c/o occasional difficultly focusing OS. Pt states she has a hard time reading as well. Pt last used CPAP about 2 weeks ago. Pt states she is currently having trouble with the mask fit. Denies floaters and FOL./BGL: did not check/A1C: 6.8 in Aug)   HISTORY OF PRESENT ILLNESS: Kaylee Hill is a 40 y.o. female who presents to the clinic today for:   HPI    Blurred Vision    In right eye.  Onset was gradual.  Vision is difficult to focus.  Severity is moderate.  Occurring intermittently.  It is worse throughout the day.  Context:  near vision.  Since onset it is stable.  Treatments tried include glasses.  Response to treatment was no improvement.  I, the attending physician,  performed the HPI with the patient and updated documentation appropriately. Additional comments: Pt referred for papilledema by Dr. Huston Foley Pt c/o occasional difficultly focusing OS. Pt states she has a hard time reading as well. Pt last used CPAP about 2 weeks ago. Pt states she is currently having trouble with the mask fit. Denies floaters and FOL. BGL: did not check A1C: 6.8 in Aug       Last edited by Elyse Jarvis on 11/28/2020  1:15 PM. (History)      Referring physician: Salley Scarlet, MD [address] [place],  [place] [address]  HISTORICAL INFORMATION:   Selected notes from the MEDICAL RECORD NUMBER    Lab Results  Component Value Date   HGBA1C 6.8 (H) 04/17/2020     CURRENT MEDICATIONS: No current outpatient medications on file. (Ophthalmic Drugs)   No current facility-administered medications for this visit. (Ophthalmic Drugs)   Current Outpatient Medications (Other)  Medication Sig  . Cyanocobalamin (VITAMIN B 12 PO) Take by mouth. Chew 2 per day  . Insulin Pen Needle (BD PEN NEEDLE MICRO U/F) 32G X 6 MM MISC Use as instructed to inject Saxenda SQ QD.  .  Iron-FA-B Cmp-C-Biot-Probiotic (FUSION PLUS) CAPS Take 1 capsule by mouth at bedtime.  . lamoTRIgine (LAMICTAL) 100 MG tablet TAKE 2 TABLETS BY MOUTH AT BEDTIME.  Marland Kitchen LATUDA 40 MG TABS tablet Take 40 mg by mouth daily.  . Liraglutide -Weight Management (SAXENDA) 18 MG/3ML SOPN Inject 0.6 mg into the skin daily. Titrate up as directed to 3mg  daily  . lisinopril (ZESTRIL) 10 MG tablet Take 1 tablet (10 mg total) by mouth daily.  . metFORMIN (GLUCOPHAGE) 500 MG tablet TAKE 1 TABLET (500 MG TOTAL) BY MOUTH TWICE A DAY WITH A MEAL.  . Multiple Vitamin (MULTIVITAMIN) tablet Take by mouth daily. Chew 2 per day  . Omega-3 Fatty Acids (OMEGA 3 PO) Take by mouth daily. Gummy chew 2 per day  . traZODone (DESYREL) 50 MG tablet Take 25-100 mg by mouth at bedtime as needed.  . TURMERIC PO Take by mouth. With black pepper chew 2 per day  . VITAMIN D PO Take by mouth. Chew 2 per day   No current facility-administered medications for this visit. (Other)      REVIEW OF SYSTEMS: ROS    Positive for: Endocrine   Last edited by on 11/28/2020  1:15 PM. (History)       ALLERGIES No Known Allergies  PAST MEDICAL HISTORY Past Medical History:  Diagnosis Date  . Anxiety   . Bipolar 1 disorder (  HCC)   . Depression   . Gestational diabetes   . Hypertension    Past Surgical History:  Procedure Laterality Date  . BREAST SURGERY      FAMILY HISTORY Family History  Problem Relation Age of Onset  . Bipolar disorder Mother   . Diabetes Father   . Hypertension Father   . Schizophrenia Father   . Asthma Brother   . Healthy Daughter   . Diabetes Maternal Grandmother   . Cancer Maternal Grandmother        breast, pancreatic cancer  . Dementia Maternal Grandfather     SOCIAL HISTORY Social History   Tobacco Use  . Smoking status: Former Smoker    Packs/day: 0.10    Years: 1.00    Pack years: 0.10    Types: Cigarettes    Quit date: 2000    Years since quitting: 22.2  .  Smokeless tobacco: Never Used  Vaping Use  . Vaping Use: Never used  Substance Use Topics  . Alcohol use: No  . Drug use: No         OPHTHALMIC EXAM: Base Eye Exam    Visual Acuity (Snellen - Linear)      Right Left   Dist cc 20/25 -2 20/25   Correction: Glasses       Tonometry (Tonopen, 1:20 PM)      Right Left   Pressure 12 12       Pupils      Pupils Dark Light Shape React APD   Right PERRL 4 3 Round Brisk None   Left PERRL 4 3 Round Brisk None       Visual Fields (Counting fingers)      Left Right    Full Full       Neuro/Psych    Oriented x3: Yes   Mood/Affect: Normal       Dilation    Both eyes: 1.0% Mydriacyl, 2.5% Phenylephrine @ 1:20 PM        Slit Lamp and Fundus Exam    External Exam      Right Left   External Normal Normal       Slit Lamp Exam      Right Left   Lids/Lashes Normal Normal   Conjunctiva/Sclera White and quiet White and quiet   Cornea Clear Clear   Anterior Chamber Deep and quiet Deep and quiet   Iris Round and reactive Round and reactive   Lens Clear Clear   Anterior Vitreous Normal Normal       Fundus Exam      Right Left   Posterior Vitreous Normal Normal   Disc Normal Normal   C/D Ratio 0.15 0.15   Macula Normal Normal   Vessels no DR no DR   Periphery Normal Normal          IMAGING AND PROCEDURES  Imaging and Procedures for 11/28/20  OCT, Retina - OU - Both Eyes       Right Eye Quality was good. Scan locations included subfoveal. Central Foveal Thickness: 261. Progression has been stable. Findings include normal foveal contour, vitreomacular adhesion .   Left Eye Quality was good. Scan locations included subfoveal. Central Foveal Thickness: 236. Progression has been stable. Findings include normal foveal contour, vitreomacular adhesion .        Color Fundus Photography Optos - OU - Both Eyes       Right Eye Progression has been stable. Disc findings include normal observations. Macula : normal  observations. Vessels : normal observations. Periphery : normal observations.   Left Eye Progression has been stable. Macula : normal observations. Vessels : normal observations. Periphery : normal observations.   Notes Normal optic nerve, no signs of optic nerve edema clinical or OCT or color fundus photography                ASSESSMENT/PLAN:  Vitreomacular adhesion of both eyes Physiologic finding no pathology  OSA (obstructive sleep apnea) Patient has been informed the importance of use of CPAP  This ophthalmologist has also informed of the importance of prevention of nighttime low oxygen (hypoxia) and its harmful effect on the retinal vasculature, that is the blood supply in the back of the eye and the potential for problems in the future.  Currently there are no findings relative to untreated or poorly treated sleep apnea at this time either than the optic nerves or in the maculae  Diabetes mellitus with no complication (HCC) The nature of diabetic retinopathy was explained using the following analogy: "Retinopathy develops in the body's blood supply like salty water corrodes the linings of pipes in a house, until rust appears, then holes in the pipes develop which leak followed by destruction and loss of the pipes as the corrosion turns them to dust. In a similar fashion, Diabetes damages the blood supply of the body by cumulative long--term elevated blood sugar, which corrodes the blood supply in the body, particularly the blood vessels supplying the retina, kidneys, and nerves".  Thus, control of blood sugar, slows the progression of the corrosive effect of diabetes mellitus.  Currently no diabetic retinopathy in either eye         ICD-10-CM   1. Diabetes mellitus with no complication (HCC)  E11.9   2. Vitreomacular adhesion of both eyes  H43.823 OCT, Retina - OU - Both Eyes    Color Fundus Photography Optos - OU - Both Eyes  3. OSA (obstructive sleep apnea)  G47.33      1.  No detectable diabetic retinopathy at this time.  With good blood sugar control follow-up is warranted in 2 years dilated eye examination.  2.  Patient understands importance of compliance with treatment of OSA for since wellbeing improvement but most importantly to prevent nightly hypoxia damage to the brain and potentially to the eyes  3.  Ophthalmic Meds Ordered this visit:  No orders of the defined types were placed in this encounter.      Return in about 2 years (around 11/29/2022) for DILATE OU, COLOR FP, OCT.  There are no Patient Instructions on file for this visit.   Explained the diagnoses, plan, and follow up with the patient and they expressed understanding.  Patient expressed understanding of the importance of proper follow up care.   Alford HighlandGary A. Kamarah Bilotta M.D. Diseases & Surgery of the Retina and Vitreous [facility] 11/28/20     Abbreviations: M myopia (nearsighted); A astigmatism; H hyperopia (farsighted); P presbyopia; Mrx spectacle prescription;  CTL contact lenses; OD right eye; OS left eye; OU both eyes  XT exotropia; ET esotropia; PEK punctate epithelial keratitis; PEE punctate epithelial erosions; DES dry eye syndrome; MGD meibomian gland dysfunction; ATs artificial tears; PFAT's preservative free artificial tears; NSC nuclear sclerotic cataract; PSC posterior subcapsular cataract; ERM epi-retinal membrane; PVD posterior vitreous detachment; RD retinal detachment; DM diabetes mellitus; DR diabetic retinopathy; NPDR non-proliferative diabetic retinopathy; PDR proliferative diabetic retinopathy; CSME clinically significant macular edema; DME diabetic macular edema; dbh dot blot hemorrhages;  CWS cotton wool spot; POAG primary open angle glaucoma; C/D cup-to-disc ratio; HVF humphrey visual field; GVF goldmann visual field; OCT optical coherence tomography; IOP intraocular pressure; BRVO Branch retinal vein occlusion; CRVO central retinal vein  occlusion; CRAO central retinal artery occlusion; BRAO branch retinal artery occlusion; RT retinal tear; SB scleral buckle; PPV pars plana vitrectomy; VH Vitreous hemorrhage; PRP panretinal laser photocoagulation; IVK intravitreal kenalog; VMT vitreomacular traction; MH Macular hole;  NVD neovascularization of the disc; NVE neovascularization elsewhere; AREDS age related eye disease study; ARMD age related macular degeneration; POAG primary open angle glaucoma; EBMD epithelial/anterior basement membrane dystrophy; ACIOL anterior chamber intraocular lens; IOL intraocular lens; PCIOL posterior chamber intraocular lens; Phaco/IOL phacoemulsification with intraocular lens placement; PRK photorefractive keratectomy; LASIK laser assisted in situ keratomileusis; HTN hypertension; DM diabetes mellitus; COPD chronic obstructive pulmonary disease

## 2020-11-28 NOTE — Telephone Encounter (Signed)
Returned patient's call. Patient stated she gets her A1C and hemoglobin tested every 3 months. Last test 07/2020; retesting was due in Feb, 2022. Patient aware  no longer works at this office. Gave patient number to [facility] provider referral service. Patient wants to know if this office will do her labs as a courtesy or if she needs to wait until she finds a new provider to repeat them. Please advise at [phone].

## 2020-11-28 NOTE — Assessment & Plan Note (Signed)
The nature of diabetic retinopathy was explained using the following analogy: "Retinopathy develops in the body's blood supply like salty water corrodes the linings of pipes in a house, until rust appears, then holes in the pipes develop which leak followed by destruction and loss of the pipes as the corrosion turns them to dust. In a similar fashion, Diabetes damages the blood supply of the body by cumulative long--term elevated blood sugar, which corrodes the blood supply in the body, particularly the blood vessels supplying the retina, kidneys, and nerves".  Thus, control of blood sugar, slows the progression of the corrosive effect of diabetes mellitus.  Currently no diabetic retinopathy in either eye

## 2020-11-28 NOTE — Assessment & Plan Note (Signed)
Patient has been informed the importance of use of CPAP  This ophthalmologist has also informed of the importance of prevention of nighttime low oxygen (hypoxia) and its harmful effect on the retinal vasculature, that is the blood supply in the back of the eye and the potential for problems in the future.  Currently there are no findings relative to untreated or poorly treated sleep apnea at this time either than the optic nerves or in the maculae

## 2020-11-28 NOTE — Assessment & Plan Note (Signed)
Physiologic finding no pathology

## 2020-11-29 NOTE — Telephone Encounter (Signed)
Call placed to patient.   Advised that we can give 90 day supply on routine medications and a 30 day supply on controlled substances.   Advised to have new PCP draw labs.

## 2020-12-04 ENCOUNTER — Other Ambulatory Visit (HOSPITAL_COMMUNITY): Payer: Self-pay

## 2020-12-04 MED ORDER — TRAZODONE HCL 50 MG PO TABS
50.0000 mg | ORAL_TABLET | ORAL | 1 refills | Status: DC
  Filled 2020-12-04: qty 180, 90d supply, fill #0

## 2020-12-04 MED ORDER — LATUDA 60 MG PO TABS
60.0000 mg | ORAL_TABLET | Freq: Every day | ORAL | 3 refills | Status: DC
  Filled 2020-12-04: qty 30, 30d supply, fill #0
  Filled 2021-03-13: qty 30, 30d supply, fill #1
  Filled 2021-05-24: qty 30, 30d supply, fill #2

## 2020-12-04 MED ORDER — LATUDA 40 MG PO TABS
40.0000 mg | ORAL_TABLET | Freq: Every day | ORAL | 1 refills | Status: DC
  Filled 2020-12-04: qty 90, 90d supply, fill #0

## 2020-12-06 ENCOUNTER — Other Ambulatory Visit (HOSPITAL_COMMUNITY): Payer: Self-pay

## 2020-12-12 ENCOUNTER — Encounter (HOSPITAL_COMMUNITY): Payer: Self-pay

## 2020-12-12 ENCOUNTER — Emergency Department (HOSPITAL_COMMUNITY)
Admission: EM | Admit: 2020-12-12 | Discharge: 2020-12-12 | Disposition: A | Discharge status: Home / Self Care | Payer: No Typology Code available for payment source | Attending: Emergency Medicine | Admitting: Emergency Medicine

## 2020-12-12 ENCOUNTER — Other Ambulatory Visit: Payer: Self-pay

## 2020-12-12 DIAGNOSIS — R519 Headache, unspecified: Secondary | ICD-10-CM | POA: Diagnosis present

## 2020-12-12 DIAGNOSIS — Z5321 Procedure and treatment not carried out due to patient leaving prior to being seen by health care provider: Secondary | ICD-10-CM | POA: Insufficient documentation

## 2020-12-12 DIAGNOSIS — R61 Generalized hyperhidrosis: Secondary | ICD-10-CM | POA: Diagnosis not present

## 2020-12-12 HISTORY — DX: Headache, unspecified: R51.9

## 2020-12-12 LAB — CBG MONITORING, ED: Glucose-Capillary: 97 mg/dL (ref 70–99)

## 2020-12-12 NOTE — ED Triage Notes (Signed)
Patient c/o a sudden headache (5 mins ago) in the forehead area and diaphoresis. Patient states she has had this happen before and was given no explanation regarding this. Patient states she is sensitive to light. Patient states she started her period 3 days ago and can trigger headaches, but not as bad as this current headache is. patient denies any nausea.

## 2020-12-12 NOTE — ED Triage Notes (Signed)
Emergency Medicine Provider Triage Evaluation Note  Kaylee Hill , a 40 y.o. female  was evaluated in triage.  Pt complains of headache of sudden while at work, likely 30 minutes to arrival. Pain to the frontal side radiating to the face. Feels like similar episode. Has not taken any medication for this, currently not on any prophalactic medications. Vision was blurry earlier, now resolved. +Photophobia.  Review of Systems  Positive: Headache, chills, vision changes Negative: Fever, neck pain, trauma,nausea    Physical Exam  BP (!) 141/100   Pulse 80   Temp 98.3 F (36.8 C)   Resp 16   Ht 5\' 2"  (1.575 m)   Wt 98.9 kg   LMP 12/09/2020   SpO2 98%   BMI 39.87 kg/m  Gen:   Awake, diaphoretic HEENT:  Atraumatic  Resp:  Normal effort Cardiac:  Normal rate  Abd:   Nondistended, nontender MSK:   Moves extremities without difficulty  Neuro:  Speech clear   Medical Decision Making  Medically screening exam initiated at 2:42 PM.  Appropriate orders placed.  Kaylee Hill was informed that the remainder of the evaluation will be completed by another provider, this initial triage assessment does not replace that evaluation, and the importance of remaining in the ED until their evaluation is complete.  Clinical Impression  MRI Brain 10/2020 : NORMAL RESULTS Similar episodes in the past, has not taken anything for pain, followed by Neurology Dr. 10/2020. Last seen on 10/25/2020. BP elevated in triage, has been out of medication since last month.  Labs  Out of lisinopril.      10/27/2020, PA-C 12/12/20 1450

## 2020-12-14 ENCOUNTER — Encounter: Payer: Self-pay | Admitting: Family Medicine

## 2020-12-14 ENCOUNTER — Other Ambulatory Visit (HOSPITAL_COMMUNITY): Payer: Self-pay

## 2020-12-14 MED ORDER — LISINOPRIL 10 MG PO TABS
10.0000 mg | ORAL_TABLET | Freq: Every day | ORAL | 0 refills | Status: DC
  Filled 2020-12-14: qty 90, 90d supply, fill #0

## 2020-12-15 ENCOUNTER — Other Ambulatory Visit (HOSPITAL_COMMUNITY): Payer: Self-pay

## 2020-12-18 ENCOUNTER — Other Ambulatory Visit: Payer: Self-pay | Admitting: Internal Medicine

## 2020-12-18 DIAGNOSIS — Z1231 Encounter for screening mammogram for malignant neoplasm of breast: Secondary | ICD-10-CM

## 2021-01-10 ENCOUNTER — Ambulatory Visit: Payer: No Typology Code available for payment source | Admitting: Neurology

## 2021-02-05 ENCOUNTER — Ambulatory Visit (INDEPENDENT_AMBULATORY_CARE_PROVIDER_SITE_OTHER): Payer: No Typology Code available for payment source | Admitting: Internal Medicine

## 2021-02-05 ENCOUNTER — Encounter: Payer: Self-pay | Admitting: Internal Medicine

## 2021-02-05 ENCOUNTER — Other Ambulatory Visit: Payer: Self-pay

## 2021-02-05 VITALS — BP 136/84 | HR 78 | Temp 98.5°F | Resp 16 | Ht 62.0 in | Wt 218.0 lb

## 2021-02-05 DIAGNOSIS — E538 Deficiency of other specified B group vitamins: Secondary | ICD-10-CM | POA: Diagnosis not present

## 2021-02-05 DIAGNOSIS — E118 Type 2 diabetes mellitus with unspecified complications: Secondary | ICD-10-CM | POA: Diagnosis not present

## 2021-02-05 DIAGNOSIS — Z23 Encounter for immunization: Secondary | ICD-10-CM | POA: Diagnosis not present

## 2021-02-05 DIAGNOSIS — Z0000 Encounter for general adult medical examination without abnormal findings: Secondary | ICD-10-CM | POA: Diagnosis not present

## 2021-02-05 DIAGNOSIS — I10 Essential (primary) hypertension: Secondary | ICD-10-CM

## 2021-02-05 LAB — CBC WITH DIFFERENTIAL/PLATELET
Basophils Absolute: 0 10*3/uL (ref 0.0–0.1)
Basophils Relative: 0.5 % (ref 0.0–3.0)
Eosinophils Absolute: 0.1 10*3/uL (ref 0.0–0.7)
Eosinophils Relative: 1.5 % (ref 0.0–5.0)
HCT: 36 % (ref 36.0–46.0)
Hemoglobin: 12.1 g/dL (ref 12.0–15.0)
Lymphocytes Relative: 43.3 % (ref 12.0–46.0)
Lymphs Abs: 2.8 10*3/uL (ref 0.7–4.0)
MCHC: 33.6 g/dL (ref 30.0–36.0)
MCV: 76 fl — ABNORMAL LOW (ref 78.0–100.0)
Monocytes Absolute: 0.5 10*3/uL (ref 0.1–1.0)
Monocytes Relative: 8.4 % (ref 3.0–12.0)
Neutro Abs: 3 10*3/uL (ref 1.4–7.7)
Neutrophils Relative %: 46.3 % (ref 43.0–77.0)
Platelets: 272 10*3/uL (ref 150.0–400.0)
RBC: 4.74 Mil/uL (ref 3.87–5.11)
RDW: 14.7 % (ref 11.5–15.5)
WBC: 6.5 10*3/uL (ref 4.0–10.5)

## 2021-02-05 LAB — FOLATE: Folate: >24.4 ng/mL (ref >5.9)

## 2021-02-05 LAB — TSH: TSH: 0.71 u[IU]/mL (ref 0.35–4.50)

## 2021-02-05 LAB — HEMOGLOBIN A1C: Hgb A1c MFr Bld: 6.6 % — ABNORMAL HIGH (ref 4.6–6.5)

## 2021-02-05 LAB — VITAMIN B12: Vitamin B-12: 960 pg/mL — ABNORMAL HIGH (ref 211–911)

## 2021-02-05 NOTE — Patient Instructions (Signed)
Type 2 Diabetes Mellitus, Diagnosis, Adult Type 2 diabetes (type 2 diabetes mellitus) is a long-term, or chronic, disease. In type 2 diabetes, one or both of these problems may be present:  The pancreas does not make enough of a hormone called insulin.  Cells in the body do not respond properly to insulin that the body makes (insulin resistance). Normally, insulin allows blood sugar (glucose) to enter cells in the body. The cells use glucose for energy. Insulin resistance or lack of insulin causes excess glucose to build up in the blood instead of going into cells. This causes high blood glucose (hyperglycemia).  What are the causes? The exact cause of type 2 diabetes is not known. What increases the risk? The following factors may make you more likely to develop this condition:  Having a family member with type 2 diabetes.  Being overweight or obese.  Being inactive (sedentary).  Having been diagnosed with insulin resistance.  Having a history of prediabetes, diabetes when you were pregnant (gestational diabetes), or polycystic ovary syndrome (PCOS). What are the signs or symptoms? In the early stage of this condition, you may not have symptoms. Symptoms develop slowly and may include:  Increased thirst or hunger.  Increased urination.  Unexplained weight loss.  Tiredness (fatigue) or weakness.  Vision changes, such as blurry vision.  Dark patches on the skin. How is this diagnosed? This condition is diagnosed based on your symptoms, your medical history, a physical exam, and your blood glucose level. Your blood glucose may be checked with one or more of the following blood tests:  A fasting blood glucose (FBG) test. You will not be allowed to eat (you will fast) for 8 hours or longer before a blood sample is taken.  A random blood glucose test. This test checks blood glucose at any time of day regardless of when you ate.  An A1C (hemoglobin A1C) blood test. This test  provides information about blood glucose levels over the previous 2-3 months.  An oral glucose tolerance test (OGTT). This test measures your blood glucose at two times: ? After fasting. This is your baseline blood glucose level. ? Two hours after drinking a beverage that contains glucose. You may be diagnosed with type 2 diabetes if:  Your fasting blood glucose level is 126 mg/dL (7.0 mmol/L) or higher.  Your random blood glucose level is 200 mg/dL (11.1 mmol/L) or higher.  Your A1C level is 6.5% or higher.  Your oral glucose tolerance test result is higher than 200 mg/dL (11.1 mmol/L). These blood tests may be repeated to confirm your diagnosis.   How is this treated? Your treatment may be managed by a specialist called an endocrinologist. Type 2 diabetes may be treated by following instructions from your health care provider about:  Making dietary and lifestyle changes. These may include: ? Following a personalized nutrition plan that is developed by a registered dietitian. ? Exercising regularly. ? Finding ways to manage stress.  Checking your blood glucose level as often as told.  Taking diabetes medicines or insulin daily. This helps to keep your blood glucose levels in the healthy range.  Taking medicines to help prevent complications from diabetes. Medicines may include: ? Aspirin. ? Medicine to lower cholesterol. ? Medicine to control blood pressure. Your health care provider will set treatment goals for you. Your goals will be based on your age, other medical conditions you have, and how you respond to diabetes treatment. Generally, the goal of treatment is to maintain the   following blood glucose levels:  Before meals: 80-130 mg/dL (4.4-7.2 mmol/L).  After meals: below 180 mg/dL (10 mmol/L).  A1C level: less than 7%. Follow these instructions at home: Questions to ask your health care provider Consider asking the following questions:  Should I meet with a certified  diabetes care and education specialist?  What diabetes medicines do I need, and when should I take them?  What equipment will I need to manage my diabetes at home?  How often do I need to check my blood glucose?  Where can I find a support group for people with diabetes?  What number can I call if I have questions?  When is my next appointment? General instructions  Take over-the-counter and prescription medicines only as told by your health care provider.  Keep all follow-up visits as told by your health care provider. This is important. Where to find more information  American Diabetes Association (ADA): www.diabetes.org  American Association of Diabetes Care and Education Specialists (ADCES): www.diabeteseducator.org  International Diabetes Federation (IDF): www.idf.org Contact a health care provider if:  Your blood glucose is at or above 240 mg/dL (13.3 mmol/L) for 2 days in a row.  You have been sick or have had a fever for 2 days or longer, and you are not getting better.  You have any of the following problems for more than 6 hours: ? You cannot eat or drink. ? You have nausea and vomiting. ? You have diarrhea. Get help right away if:  You have severe hypoglycemia. This means your blood glucose is lower than 54 mg/dL (3.0 mmol/L).  You become confused or you have trouble thinking clearly.  You have difficulty breathing.  You have moderate or large ketone levels in your urine. These symptoms may represent a serious problem that is an emergency. Do not wait to see if the symptoms will go away. Get medical help right away. Call your local emergency services (911 in the [place] Do not drive yourself to the hospital. Summary  Type 2 diabetes (type 2 diabetes mellitus) is a long-term, or chronic, disease. In type 2 diabetes, the pancreas does not make enough of a hormone called insulin, or cells in the body do not respond properly to insulin that the body makes (insulin  resistance).  This condition is treated by making dietary and lifestyle changes and taking diabetes medicines or insulin.  Your health care provider will set treatment goals for you. Your goals will be based on your age, other medical conditions you have, and how you respond to diabetes treatment.  Keep all follow-up visits as told by your health care provider. This is important. This information is not intended to replace advice given to you by your health care provider. Make sure you discuss any questions you have with your health care provider. Document Revised: 03/15/2020 Document Reviewed: 03/15/2020 Elsevier Patient Education  2021 [employer]  

## 2021-02-05 NOTE — Progress Notes (Signed)
Subjective:  Patient ID: Kaylee Hill, female    DOB: 1981/02/03  Age: 40 y.o. MRN: [id number]  CC: Hypertension, Diabetes, and Annual Exam  This visit occurred during the SARS-CoV-2 public health emergency.  Safety protocols were in place, including screening questions prior to the visit, additional usage of staff PPE, and extensive cleaning of exam room while observing appropriate contact time as indicated for disinfecting solutions.    HPI Kaylee Hill presents for f/up and to establish.  She has a history of DM2 and hypertension.  She has been taking an ACE inhibitor and using a GLP-1 agonist.  She denies any recent episodes of headache, blurred vision, chest pain, shortness of breath, or polys.  She is sexually active and is not using any form of contraception at this time.  History Kaylee Hill has a past medical history of Anxiety, Bipolar 1 disorder (HCC), Depression, Gestational diabetes, Headache, and Hypertension.   She has a past surgical history that includes Breast surgery.   Her family history includes Asthma in her brother; Bipolar disorder in her mother; Cancer in her maternal grandmother; Dementia in her maternal grandfather; Diabetes in her father and maternal grandmother; Healthy in her daughter; Hypertension in her father; Schizophrenia in her father.She reports that she quit smoking about 22 years ago. Her smoking use included cigarettes. She has a 0.10 pack-year smoking history. She has never used smokeless tobacco. She reports that she does not drink alcohol and does not use drugs.  Outpatient Medications Prior to Visit  Medication Sig Dispense Refill  . Insulin Pen Needle (BD PEN NEEDLE MICRO U/F) 32G X 6 MM MISC Use as instructed to inject Saxenda SQ QD. 100 each 1  . Insulin Pen Needle 31G X 6 MM MISC USE AS DIRECTED TO INJECT SAXENDA 100 each 1  . LATUDA 40 MG TABS tablet Take 40 mg by mouth daily.    . Lurasidone HCl (LATUDA) 60 MG TABS take 1 tablet by mouth  daily with a meal 30 tablet 3  . metFORMIN (GLUCOPHAGE) 500 MG tablet TAKE 1 TABLET BY MOUTH TWICE A DAY WITH A MEAL. 180 tablet 0  . Multiple Vitamin (MULTIVITAMIN) tablet Take by mouth daily. Chew 2 per day    . Omega-3 Fatty Acids (OMEGA 3 PO) Take by mouth daily. Gummy chew 2 per day    . traZODone (DESYREL) 50 MG tablet take 1/2 - 2 tablet by mouth at night as needed for insomnia 180 tablet 1  . TURMERIC PO Take by mouth. With black pepper chew 2 per day    . VITAMIN D PO Take by mouth. Chew 2 per day    . Liraglutide -Weight Management 18 MG/3ML SOPN INJECT 0.6MG  INTO THE SKIN DAILY. TITRATE UP TO 3 MG DAILY 15 mL 3  . lisinopril (ZESTRIL) 10 MG tablet Take 1 tablet (10 mg total) by mouth daily. 90 tablet 0  . traZODone (DESYREL) 50 MG tablet Take 25-100 mg by mouth at bedtime as needed.    . traZODone (DESYREL) 50 MG tablet TAKE 1/2 TO 2 TABLETS BY MOUTH AT BEDTIME AS NEEDED FOR INSOMNIA. 180 tablet 1  . Cyanocobalamin (VITAMIN B 12 PO) Take by mouth. Chew 2 per day    . lamoTRIgine (LAMICTAL) 100 MG tablet TAKE 2 TABLETS BY MOUTH AT BEDTIME. 180 tablet 1  . lamoTRIgine (LAMICTAL) 100 MG tablet TAKE 2 TABLETS BY MOUTH DAILY. 180 tablet 1   No facility-administered medications prior to visit.    ROS Review of  Systems  Constitutional: Negative for appetite change, diaphoresis, fatigue and unexpected weight change.  HENT: Negative.   Eyes: Negative for visual disturbance.  Respiratory: Negative for cough, chest tightness, shortness of breath and wheezing.   Cardiovascular: Negative for chest pain, palpitations and leg swelling.  Gastrointestinal: Negative for abdominal pain, diarrhea and nausea.  Endocrine: Negative.  Negative for polydipsia, polyphagia and polyuria.  Genitourinary: Negative.  Negative for difficulty urinating and dysuria.  Musculoskeletal: Negative.   Skin: Negative.   Neurological: Negative.  Negative for dizziness, weakness and headaches.  Hematological:  Negative for adenopathy. Does not bruise/bleed easily.  Psychiatric/Behavioral: Negative.     Objective:  BP 136/84 (BP Location: Left Arm, Patient Position: Sitting, Cuff Size: Large)   Pulse 78   Temp 98.5 F (36.9 C) (Oral)   Resp 16   Ht 5\' 2"  (1.575 m)   Wt 218 lb (98.9 kg)   LMP 01/18/2021   SpO2 98%   BMI 39.87 kg/m   Physical Exam Vitals reviewed.  Constitutional:      Appearance: Normal appearance.  HENT:     Nose: Nose normal.     Mouth/Throat:     Mouth: Mucous membranes are moist.  Eyes:     General: No scleral icterus.    Conjunctiva/sclera: Conjunctivae normal.  Cardiovascular:     Rate and Rhythm: Normal rate and regular rhythm.     Heart sounds: No murmur heard.   Pulmonary:     Effort: Pulmonary effort is normal.     Breath sounds: No stridor. No wheezing, rhonchi or rales.  Abdominal:     General: Abdomen is protuberant. Bowel sounds are normal. There is no distension.     Palpations: Abdomen is soft. There is no hepatomegaly, splenomegaly or mass.     Tenderness: There is no abdominal tenderness.  Musculoskeletal:        General: Normal range of motion.     Cervical back: Neck supple.     Right lower leg: No edema.     Left lower leg: No edema.  Lymphadenopathy:     Cervical: No cervical adenopathy.  Skin:    General: Skin is warm and dry.     Findings: No rash.  Neurological:     General: No focal deficit present.     Mental Status: She is alert.  Psychiatric:        Mood and Affect: Mood normal.        Behavior: Behavior normal.     Lab Results  Component Value Date   WBC 6.5 02/05/2021   HGB 12.1 02/05/2021   HCT 36.0 02/05/2021   PLT 272.0 02/05/2021   GLUCOSE 90 02/05/2021   CHOL 178 04/17/2020   TRIG 158 (H) 04/17/2020   HDL 30 (L) 04/17/2020   LDLCALC 121 (H) 04/17/2020   ALT 13 10/12/2020   AST 20 10/12/2020   NA 138 02/05/2021   K 4.3 02/05/2021   CL 102 02/05/2021   CREATININE 0.60 02/05/2021   BUN 8 02/05/2021    CO2 27 02/05/2021   TSH 0.71 02/05/2021   HGBA1C 6.6 (H) 02/05/2021   MICROALBUR 0.6 04/17/2020    Assessment & Plan:   Kaylee Hill was seen today for hypertension, diabetes and annual exam.  Diagnoses and all orders for this visit:  Benign essential hypertension- She has not achieved her blood pressure goal of 130/80.  She is at risk of pregnancy so I recommended that she stop taking the ACE inhibitor.  Will start  nebivolol to control her blood pressure. -     CBC with Differential/Platelet; Future -     Basic metabolic panel; Future -     TSH; Future -     TSH -     Basic metabolic panel -     CBC with Differential/Platelet  Type II diabetes mellitus with manifestations (HCC)- Her blood sugar is adequately well controlled.  I recommended that she stop using the GLP-1 agonist as long as she is at risk of pregnancy. -     Basic metabolic panel; Future -     Hemoglobin A1c; Future -     Hemoglobin A1c -     Basic metabolic panel  Need for vaccination -     Pneumococcal conjugate vaccine 20-valent  B12 deficiency- Her H&H, B12, and folate levels are normal now. -     Vitamin B12; Future -     Folate; Future -     Folate -     Vitamin B12  Routine general medical examination at a health care facility- Exam completed, labs reviewed, vaccines reviewed and updated, cancer screenings are up-to-date, patient education was given.   I have discontinued Kaylee Hill's lamoTRIgine, Cyanocobalamin (VITAMIN B 12 PO), Liraglutide -Weight Management, lamoTRIgine, and lisinopril. I am also having her start on nebivolol. Additionally, I am having her maintain her multivitamin, Omega-3 Fatty Acids (OMEGA 3 PO), Latuda, BD Pen Needle Micro U/F, TURMERIC PO, VITAMIN D PO, Insulin Pen Needle, metFORMIN, Latuda, and traZODone.  Meds ordered this encounter  Medications  . nebivolol (BYSTOLIC) 5 MG tablet    Sig: Take 1 tablet (5 mg total) by mouth daily.    Dispense:  90 tablet    Refill:  1      Follow-up: Return in about 6 months (around 08/07/2021).  Sanda Linger, MD

## 2021-02-06 ENCOUNTER — Encounter: Payer: Self-pay | Admitting: Internal Medicine

## 2021-02-06 LAB — BASIC METABOLIC PANEL
BUN: 8 mg/dL (ref 6–23)
CO2: 27 mEq/L (ref 19–32)
Calcium: 9.3 mg/dL (ref 8.4–10.5)
Chloride: 102 mEq/L (ref 96–112)
Creatinine, Ser: 0.6 mg/dL (ref 0.40–1.20)
GFR: 111.05 mL/min (ref >60.00)
Glucose, Bld: 90 mg/dL (ref 70–99)
Potassium: 4.3 mEq/L (ref 3.5–5.1)
Sodium: 138 mEq/L (ref 135–145)

## 2021-02-07 ENCOUNTER — Encounter: Payer: Self-pay | Admitting: Internal Medicine

## 2021-02-07 ENCOUNTER — Other Ambulatory Visit (HOSPITAL_COMMUNITY): Payer: Self-pay

## 2021-02-07 DIAGNOSIS — Z23 Encounter for immunization: Secondary | ICD-10-CM | POA: Insufficient documentation

## 2021-02-07 DIAGNOSIS — Z0000 Encounter for general adult medical examination without abnormal findings: Secondary | ICD-10-CM | POA: Insufficient documentation

## 2021-02-07 MED ORDER — NEBIVOLOL HCL 5 MG PO TABS
5.0000 mg | ORAL_TABLET | Freq: Every day | ORAL | 1 refills | Status: DC
  Filled 2021-02-07: qty 90, 90d supply, fill #0
  Filled 2021-07-04: qty 90, 90d supply, fill #1

## 2021-02-12 ENCOUNTER — Ambulatory Visit
Admission: RE | Admit: 2021-02-12 | Discharge: 2021-02-12 | Disposition: A | Discharge status: Home / Self Care | Payer: No Typology Code available for payment source | Source: Ambulatory Visit

## 2021-02-12 ENCOUNTER — Other Ambulatory Visit: Payer: Self-pay

## 2021-02-12 DIAGNOSIS — Z1231 Encounter for screening mammogram for malignant neoplasm of breast: Secondary | ICD-10-CM

## 2021-02-13 ENCOUNTER — Ambulatory Visit: Payer: No Typology Code available for payment source | Admitting: Neurology

## 2021-03-13 ENCOUNTER — Encounter: Payer: Self-pay | Admitting: Internal Medicine

## 2021-03-13 ENCOUNTER — Other Ambulatory Visit (HOSPITAL_COMMUNITY): Payer: Self-pay

## 2021-05-13 ENCOUNTER — Encounter (INDEPENDENT_AMBULATORY_CARE_PROVIDER_SITE_OTHER): Payer: Self-pay | Admitting: Ophthalmology

## 2021-05-24 ENCOUNTER — Other Ambulatory Visit (HOSPITAL_COMMUNITY): Payer: Self-pay

## 2021-05-25 ENCOUNTER — Other Ambulatory Visit (HOSPITAL_COMMUNITY): Payer: Self-pay

## 2021-05-25 ENCOUNTER — Other Ambulatory Visit: Payer: Self-pay

## 2021-05-25 ENCOUNTER — Ambulatory Visit (HOSPITAL_COMMUNITY)
Admission: EM | Admit: 2021-05-25 | Discharge: 2021-05-25 | Disposition: A | Discharge status: Home / Self Care | Payer: No Typology Code available for payment source | Attending: Student | Admitting: Student

## 2021-05-25 ENCOUNTER — Encounter (HOSPITAL_COMMUNITY): Payer: Self-pay | Admitting: Emergency Medicine

## 2021-05-25 DIAGNOSIS — J029 Acute pharyngitis, unspecified: Secondary | ICD-10-CM | POA: Diagnosis not present

## 2021-05-25 DIAGNOSIS — Z112 Encounter for screening for other bacterial diseases: Secondary | ICD-10-CM | POA: Diagnosis not present

## 2021-05-25 LAB — POCT RAPID STREP A, ED / UC: Streptococcus, Group A Screen (Direct): NEGATIVE

## 2021-05-25 MED ORDER — LIDOCAINE VISCOUS HCL 2 % MT SOLN
15.0000 mL | OROMUCOSAL | 0 refills | Status: DC | PRN
  Filled 2021-05-25: qty 100, 2d supply, fill #0

## 2021-05-25 NOTE — ED Provider Notes (Signed)
[facility]    CSN: [id number] Arrival date & time: 05/25/21  3419      History   Chief Complaint Chief Complaint  Patient presents with   Sore Throat    Congestion     HPI Kaylee Hill is a 40 y.o. female presenting with sore throat and congestion for 2 days.  Slight cough.  Medical history noncontributory.  Over-the-counter medications providing some relief.  Denies history of cardiopulmonary disease.  States negative COVID test already, requesting strep test.  Denies trouble swallowing, shortness of breath, difficulty speaking or handling secretions.  HPI  Past Medical History:  Diagnosis Date   Anxiety    Bipolar 1 disorder (HCC)    Depression    Gestational diabetes    Headache    Hypertension     Patient Active Problem List   Diagnosis Date Noted   Need for vaccination 02/07/2021   Routine general medical examination at a health care facility 02/07/2021   Type II diabetes mellitus with manifestations (HCC) 02/05/2021   B12 deficiency 02/05/2021   Vitreomacular adhesion of both eyes 11/28/2020   OSA (obstructive sleep apnea) 11/28/2020   Hyperlipidemia associated with type 2 diabetes mellitus (HCC) 04/17/2020   Arthropathy of lumbar facet joint 06/01/2019   Primary osteoarthritis of both knees 06/01/2019   Vitamin D deficiency 08/12/2018   Iron deficiency anemia, unspecified 08/07/2018   Class 3 obesity 05/20/2018   Diabetes mellitus with no complication (HCC) 05/01/2017   Bipolar disorder with psychotic features (HCC) 04/28/2017   Sickle cell trait (HCC) 12/09/2014   Benign essential hypertension 04/09/2013   Familial multiple lipoprotein-type hyperlipidemia 04/09/2013    Past Surgical History:  Procedure Laterality Date   BREAST SURGERY      OB History     Gravida  1   Para  1   Term  1   Preterm      AB      Living  1      SAB      IAB      Ectopic      Multiple      Live Births  1            Home  Medications    Prior to Admission medications   Medication Sig Start Date End Date Taking? Authorizing Provider  lidocaine (XYLOCAINE) 2 % solution Swish and gargle as directed 15 mLs in the mouth or throat as needed for mouth pain. 05/25/21  Yes Rhys Martini, PA-C  Insulin Pen Needle (BD PEN NEEDLE MICRO U/F) 32G X 6 MM MISC Use as instructed to inject Saxenda SQ QD. 10/02/20   Salley Scarlet, MD  Insulin Pen Needle 31G X 6 MM MISC USE AS DIRECTED TO INJECT [facility] 10/02/20 10/02/21  [place], Velna Hatchet, MD  LATUDA 40 MG TABS tablet Take 40 mg by mouth daily. 11/29/19   [provider]  Lurasidone HCl (LATUDA) 60 MG TABS take 1 tablet by mouth daily with a meal 12/04/20     metFORMIN (GLUCOPHAGE) 500 MG tablet TAKE 1 TABLET BY MOUTH TWICE A DAY WITH A MEAL. 07/04/20 07/04/21  Salley Scarlet, MD  Multiple Vitamin (MULTIVITAMIN) tablet Take by mouth daily. Chew 2 per day    [provider]  nebivolol (BYSTOLIC) 5 MG tablet Take 1 tablet (5 mg total) by mouth daily. 02/07/21   Etta Grandchild, MD  Omega-3 Fatty Acids (OMEGA 3 PO) Take by mouth daily. Gummy chew 2  per day    [provider]  traZODone (DESYREL) 50 MG tablet take 1/2 - 2 tablet by mouth at night as needed for insomnia 12/04/20     TURMERIC PO Take by mouth. With black pepper chew 2 per day    [provider]  VITAMIN D PO Take by mouth. Chew 2 per day    [provider]    Family History Family History  Problem Relation Age of Onset   Bipolar disorder Mother    Diabetes Father    Hypertension Father    Schizophrenia Father    Asthma Brother    Healthy Daughter    Diabetes Maternal Grandmother    Cancer Maternal Grandmother        breast, pancreatic cancer   Dementia Maternal Grandfather     Social History Social History   Tobacco Use   Smoking status: Former    Packs/day: 0.10    Years: 1.00    Pack years: 0.10    Types: Cigarettes    Quit date: 2000    Years since  quitting: 22.7   Smokeless tobacco: Never  Vaping Use   Vaping Use: Never used  Substance Use Topics   Alcohol use: No   Drug use: No     Allergies   Patient has no known allergies.   Review of Systems Review of Systems  Constitutional:  Negative for appetite change, chills and fever.  HENT:  Positive for congestion and sore throat. Negative for ear pain, rhinorrhea, sinus pressure and sinus pain.   Eyes:  Negative for redness and visual disturbance.  Respiratory:  Positive for cough. Negative for chest tightness, shortness of breath and wheezing.   Cardiovascular:  Negative for chest pain and palpitations.  Gastrointestinal:  Negative for abdominal pain, constipation, diarrhea, nausea and vomiting.  Genitourinary:  Negative for dysuria, frequency and urgency.  Musculoskeletal:  Negative for myalgias.  Neurological:  Negative for dizziness, weakness and headaches.  Psychiatric/Behavioral:  Negative for confusion.   All other systems reviewed and are negative.   Physical Exam Triage Vital Signs ED Triage Vitals  Enc Vitals Group     BP 05/25/21 1020 (!) 148/94     Pulse Rate 05/25/21 1020 67     Resp 05/25/21 1020 18     Temp 05/25/21 1020 98 F (36.7 C)     Temp src --      SpO2 05/25/21 1020 97 %     Weight --      Height --      Head Circumference --      Peak Flow --      Pain Score 05/25/21 1022 3     Pain Loc --      Pain Edu? --      Excl. in GC? --    No data found.  Updated Vital Signs BP (!) 148/94   Pulse 67   Temp 98 F (36.7 C)   Resp 18   SpO2 97%   Visual Acuity Right Eye Distance:   Left Eye Distance:   Bilateral Distance:    Right Eye Near:   Left Eye Near:    Bilateral Near:     Physical Exam Vitals reviewed.  Constitutional:      General: She is not in acute distress.    Appearance: Normal appearance. She is not ill-appearing.  HENT:     Head: Normocephalic and atraumatic.     Right Ear: Tympanic membrane, ear canal and  external ear normal. No tenderness. No middle ear effusion. There is no impacted cerumen. Tympanic membrane is not perforated, erythematous, retracted or bulging.     Left Ear: Tympanic membrane, ear canal and external ear normal. No tenderness.  No middle ear effusion. There is no impacted cerumen. Tympanic membrane is not perforated, erythematous, retracted or bulging.     Nose: Nose normal. No congestion.     Mouth/Throat:     Mouth: Mucous membranes are moist.     Pharynx: Uvula midline. Posterior oropharyngeal erythema present. No oropharyngeal exudate.     Tonsils: No tonsillar exudate.     Comments: Smooth erythema posterior pharynx On exam, uvula is midline, she is tolerating her secretions without difficulty, there is no trismus, no drooling, she has normal phonation  Eyes:     Extraocular Movements: Extraocular movements intact.     Pupils: Pupils are equal, round, and reactive to light.  Cardiovascular:     Rate and Rhythm: Normal rate and regular rhythm.     Heart sounds: Normal heart sounds.  Pulmonary:     Effort: Pulmonary effort is normal.     Breath sounds: Normal breath sounds. No decreased breath sounds, wheezing, rhonchi or rales.  Abdominal:     Palpations: Abdomen is soft.     Tenderness: There is no abdominal tenderness. There is no guarding or rebound.  Neurological:     General: No focal deficit present.     Mental Status: She is alert and oriented to person, place, and time.  Psychiatric:        Mood and Affect: Mood normal.        Behavior: Behavior normal.        Thought Content: Thought content normal.        Judgment: Judgment normal.     UC Treatments / Results  Labs (all labs ordered are listed, but only abnormal results are displayed) Labs Reviewed  CULTURE, GROUP A STREP [facility] [place])  POCT RAPID STREP A, ED / UC    EKG   Radiology No results found.  Procedures Procedures (including critical care time)  Medications Ordered in  UC Medications - No data to display  Initial Impression / Assessment and Plan / UC Course  I have reviewed the triage vital signs and the nursing notes.  Pertinent labs & imaging results that were available during my care of the patient were reviewed by me and considered in my medical decision making (see chart for details).     This patient is a very pleasant 40 y.o. year old female presenting with viral pharyngitis. Today this pt is afebrile nontachycardic nontachypneic, oxygenating well on room air, no wheezes rhonchi or rales.    Rapid strep negative, culture sent . Multiple negative home COVID tests and health at work COVID test on day 2.  Recommended retesting on day 3.  Symptomatic relief with viscous lidocaine.  Over-the-counter medications for additional relief.  ED return precautions discussed. Patient verbalizes understanding and agreement.    Final Clinical Impressions(s) / UC Diagnoses   Final diagnoses:  Viral pharyngitis  Screening for streptococcal infection     Discharge Instructions      -For sore throat, use lidocaine mouthwash up to every 4 hours. Make sure not to eat for at least 1 hour after using this, as your mouth will be very numb and you could bite yourself. -Over-the-counter medications for additional relief -With a virus, you're typically contagious for 5-7 days, or as long as you're  having fevers.  -I recommend retesting for covid on day 3 or 4 (today or tomorrow) as this is often not positive until day 3 or 4   ED Prescriptions     Medication Sig Dispense Auth. Provider   lidocaine (XYLOCAINE) 2 % solution Swish and gargle as directed 15 mLs in the mouth or throat as needed for mouth pain. 100 mL Rhys Martini, PA-C      PDMP not reviewed this encounter.   Rhys Martini, PA-C 05/25/21 1110

## 2021-05-25 NOTE — Discharge Instructions (Addendum)
-  For sore throat, use lidocaine mouthwash up to every 4 hours. Make sure not to eat for at least 1 hour after using this, as your mouth will be very numb and you could bite yourself. -Over-the-counter medications for additional relief -With a virus, you're typically contagious for 5-7 days, or as long as you're having fevers.  -I recommend retesting for covid on day 3 or 4 (today or tomorrow) as this is often not positive until day 3 or 4

## 2021-05-25 NOTE — ED Triage Notes (Signed)
Pt is present today with a sore throat and nasal congestion. Pt states sx started x2 days ago. Pt states that she took at home covid test and results are negative.

## 2021-05-27 LAB — CULTURE, GROUP A STREP (THRC)

## 2021-05-29 ENCOUNTER — Encounter: Payer: Self-pay | Admitting: Internal Medicine

## 2021-06-04 ENCOUNTER — Ambulatory Visit: Payer: No Typology Code available for payment source | Admitting: Internal Medicine

## 2021-06-06 ENCOUNTER — Ambulatory Visit (INDEPENDENT_AMBULATORY_CARE_PROVIDER_SITE_OTHER): Payer: No Typology Code available for payment source | Admitting: Internal Medicine

## 2021-06-06 ENCOUNTER — Encounter: Payer: Self-pay | Admitting: Internal Medicine

## 2021-06-06 ENCOUNTER — Other Ambulatory Visit: Payer: Self-pay

## 2021-06-06 ENCOUNTER — Other Ambulatory Visit (HOSPITAL_COMMUNITY): Payer: Self-pay

## 2021-06-06 VITALS — BP 126/84 | HR 77 | Temp 98.4°F | Resp 16 | Ht 62.0 in | Wt 226.0 lb

## 2021-06-06 DIAGNOSIS — H0259 Other disorders affecting eyelid function: Secondary | ICD-10-CM | POA: Diagnosis not present

## 2021-06-06 DIAGNOSIS — R202 Paresthesia of skin: Secondary | ICD-10-CM

## 2021-06-06 DIAGNOSIS — R200 Anesthesia of skin: Secondary | ICD-10-CM | POA: Diagnosis not present

## 2021-06-06 DIAGNOSIS — H6501 Acute serous otitis media, right ear: Secondary | ICD-10-CM

## 2021-06-06 DIAGNOSIS — E118 Type 2 diabetes mellitus with unspecified complications: Secondary | ICD-10-CM | POA: Diagnosis not present

## 2021-06-06 DIAGNOSIS — J301 Allergic rhinitis due to pollen: Secondary | ICD-10-CM | POA: Insufficient documentation

## 2021-06-06 LAB — MICROALBUMIN / CREATININE URINE RATIO
Creatinine,U: 101.8 mg/dL
Microalb Creat Ratio: 0.7 mg/g (ref 0.0–30.0)
Microalb, Ur: <0.7 mg/dL (ref 0.0–1.9)

## 2021-06-06 LAB — HEMOGLOBIN A1C: Hgb A1c MFr Bld: 6.8 % — ABNORMAL HIGH (ref 4.6–6.5)

## 2021-06-06 MED ORDER — METHYLPREDNISOLONE 4 MG PO TBPK
ORAL_TABLET | ORAL | 0 refills | Status: AC
  Filled 2021-06-06: qty 21, 6d supply, fill #0

## 2021-06-06 NOTE — Progress Notes (Signed)
Subjective:  Patient ID: Kaylee Hill, female    DOB: 23-Oct-1980  Age: 40 y.o. MRN: [id number]  CC: Diabetes and URI  This visit occurred during the SARS-CoV-2 public health emergency.  Safety protocols were in place, including screening questions prior to the visit, additional usage of staff PPE, and extensive cleaning of exam room while observing appropriate contact time as indicated for disinfecting solutions.    HPI Kaylee Hill presents for f/up -  She complains of a 3-week history of excessive eye blinking.  She recently saw an eye doctor and was told that her eyes are normal.  She was also recently seen elsewhere for pharyngitis and was told that she has a viral upper respiratory infection.  She has nasal congestion, tension around her eyebrows, and popping/pressure in her right ear.  She also complains of chronic tingling in her fingers.  Outpatient Medications Prior to Visit  Medication Sig Dispense Refill   Lurasidone HCl (LATUDA) 60 MG TABS take 1 tablet by mouth daily with a meal 30 tablet 3   Multiple Vitamin (MULTIVITAMIN) tablet Take by mouth daily. Chew 2 per day     nebivolol (BYSTOLIC) 5 MG tablet Take 1 tablet (5 mg total) by mouth daily. 90 tablet 1   traZODone (DESYREL) 50 MG tablet take 1/2 - 2 tablet by mouth at night as needed for insomnia 180 tablet 1   Insulin Pen Needle (BD PEN NEEDLE MICRO U/F) 32G X 6 MM MISC Use as instructed to inject Saxenda SQ QD. 100 each 1   Insulin Pen Needle 31G X 6 MM MISC USE AS DIRECTED TO INJECT SAXENDA 100 each 1   LATUDA 40 MG TABS tablet Take 40 mg by mouth daily.     lidocaine (XYLOCAINE) 2 % solution Swish and gargle as directed 15 mLs in the mouth or throat as needed for mouth pain. 100 mL 0   metFORMIN (GLUCOPHAGE) 500 MG tablet TAKE 1 TABLET BY MOUTH TWICE A DAY WITH A MEAL. 180 tablet 0   Omega-3 Fatty Acids (OMEGA 3 PO) Take by mouth daily. Gummy chew 2 per day     TURMERIC PO Take by mouth. With black pepper chew 2  per day     VITAMIN D PO Take by mouth. Chew 2 per day     No facility-administered medications prior to visit.    ROS Review of Systems  Constitutional:  Negative for chills, diaphoresis, fatigue and fever.  HENT:  Positive for congestion, ear pain, postnasal drip and rhinorrhea. Negative for facial swelling, nosebleeds, sinus pressure, sinus pain, sneezing, sore throat and voice change.   Cardiovascular:  Negative for chest pain, palpitations and leg swelling.  Gastrointestinal: Negative.  Negative for abdominal pain, constipation, diarrhea, nausea and vomiting.  Genitourinary:  Negative for difficulty urinating.  Musculoskeletal: Negative.   Skin: Negative.   Neurological:  Positive for headaches. Negative for dizziness, speech difficulty, weakness and light-headedness.  Hematological:  Negative for adenopathy. Does not bruise/bleed easily.  Psychiatric/Behavioral: Negative.     Objective:  BP 126/84 (BP Location: Left Arm, Patient Position: Sitting, Cuff Size: Large)   Pulse 77   Temp 98.4 F (36.9 C) (Oral)   Resp 16   Ht 5\' 2"  (1.575 m)   Wt 226 lb (102.5 kg)   SpO2 98%   BMI 41.34 kg/m   BP Readings from Last 3 Encounters:  06/06/21 126/84  05/25/21 (!) 148/94  02/05/21 136/84    Wt Readings from Last 3  Encounters:  06/06/21 226 lb (102.5 kg)  02/05/21 218 lb (98.9 kg)  10/12/20 220 lb (99.8 kg)    Physical Exam Vitals reviewed.  Constitutional:      General: She is not in acute distress.    Appearance: Normal appearance. She is obese. She is not toxic-appearing or diaphoretic.  HENT:     Right Ear: Hearing, ear canal and external ear normal. No drainage. A middle ear effusion (serous fluid) is present. Tympanic membrane is not injected.     Left Ear: Hearing, tympanic membrane, ear canal and external ear normal. No drainage.  No middle ear effusion. Tympanic membrane is not injected.     Nose: Mucosal edema, congestion and rhinorrhea present. Rhinorrhea is  clear.     Right Nostril: No epistaxis.     Left Nostril: No epistaxis.     Right Turbinates: Not enlarged, swollen or pale.     Left Turbinates: Not enlarged, swollen or pale.     Right Sinus: No maxillary sinus tenderness or frontal sinus tenderness.     Left Sinus: No maxillary sinus tenderness or frontal sinus tenderness.     Mouth/Throat:     Mouth: Mucous membranes are moist.     Pharynx: Oropharynx is clear. No pharyngeal swelling, posterior oropharyngeal erythema or uvula swelling.     Tonsils: No tonsillar exudate.  Eyes:     Conjunctiva/sclera: Conjunctivae normal.  Cardiovascular:     Rate and Rhythm: Normal rate and regular rhythm.     Heart sounds: No murmur heard. Pulmonary:     Effort: Pulmonary effort is normal. No tachypnea.     Breath sounds: No stridor. Examination of the right-middle field reveals rhonchi. Examination of the left-middle field reveals rhonchi. Rhonchi present. No wheezing or rales.  Chest:     Chest wall: No tenderness.  Abdominal:     General: Abdomen is protuberant. There is no distension.     Palpations: Abdomen is soft. There is no hepatomegaly, splenomegaly or mass.     Tenderness: There is no abdominal tenderness.  Musculoskeletal:        General: Normal range of motion.     Cervical back: Neck supple.     Right lower leg: No edema.     Left lower leg: No edema.  Skin:    General: Skin is warm and dry.     Findings: No rash.  Neurological:     General: No focal deficit present.     Mental Status: She is alert. Mental status is at baseline.  Psychiatric:        Mood and Affect: Mood normal.        Behavior: Behavior normal.    Lab Results  Component Value Date   WBC 6.5 02/05/2021   HGB 12.1 02/05/2021   HCT 36.0 02/05/2021   PLT 272.0 02/05/2021   GLUCOSE 90 02/05/2021   CHOL 178 04/17/2020   TRIG 158 (H) 04/17/2020   HDL 30 (L) 04/17/2020   LDLCALC 121 (H) 04/17/2020   ALT 13 10/12/2020   AST 20 10/12/2020   NA 138  02/05/2021   K 4.3 02/05/2021   CL 102 02/05/2021   CREATININE 0.60 02/05/2021   BUN 8 02/05/2021   CO2 27 02/05/2021   TSH 0.87 06/06/2021   HGBA1C 6.8 (H) 06/06/2021   MICROALBUR <0.7 06/06/2021    No results found.  Assessment & Plan:   Kaylee Hill was seen today for diabetes and uri.  Diagnoses and all orders for  this visit:  Numbness and tingling in both hands- I do not see any secondary causes for this. -     Thyroid peroxidase antibody; Future -     Thyroid Panel With TSH; Future -     Thyroid Panel With TSH -     Thyroid peroxidase antibody  Excessive involuntary blinking- I think this is an extraparametal side effect caused by Latuda.  I have asked her to discontinue Latuda for few days to see if the symptoms improve.  I have also asked her to discuss this with her psychiatrist. -     Thyroid peroxidase antibody; Future -     Thyroid Panel With TSH; Future -     Thyroid Panel With TSH -     Thyroid peroxidase antibody  Type II diabetes mellitus with manifestations (HCC)- Her A1c is up to 6.8%.  Will increase the dose of metformin. -     Hemoglobin A1c; Future -     Microalbumin / creatinine urine ratio; Future -     Microalbumin / creatinine urine ratio -     Hemoglobin A1c -     metFORMIN (GLUCOPHAGE XR) 750 MG 24 hr tablet; Take 1 tablet (750 mg total) by mouth daily with breakfast.  Seasonal allergic rhinitis due to pollen -     methylPREDNISolone (MEDROL DOSEPAK) 4 MG TBPK tablet; TAKE AS DIRECTED  Non-recurrent acute serous otitis media of right ear -     methylPREDNISolone (MEDROL DOSEPAK) 4 MG TBPK tablet; TAKE AS DIRECTED  I have discontinued Annitta Jersey D. Runk's Omega-3 Fatty Acids (OMEGA 3 PO), BD Pen Needle Micro U/F, TURMERIC PO, VITAMIN D PO, Insulin Pen Needle, metFORMIN, and lidocaine. I am also having her start on methylPREDNISolone and metFORMIN. Additionally, I am having her maintain her multivitamin, Latuda, traZODone, and nebivolol.  Meds ordered  this encounter  Medications   methylPREDNISolone (MEDROL DOSEPAK) 4 MG TBPK tablet    Sig: TAKE AS DIRECTED    Dispense:  21 tablet    Refill:  0   metFORMIN (GLUCOPHAGE XR) 750 MG 24 hr tablet    Sig: Take 1 tablet (750 mg total) by mouth daily with breakfast.    Dispense:  90 tablet    Refill:  1      Follow-up: Return in about 3 months (around 09/06/2021).  Sanda Linger, MD

## 2021-06-06 NOTE — Patient Instructions (Signed)
Tardive Dyskinesia Tardive dyskinesia is a disorder that causes uncontrollable body movements. It occurs in some people who are taking certain medicines to treat a mental illness (neuroleptic medicine) or have taken this type of medicine in the past. These medicines block the effects of a specific brain chemical (dopamine). Sometimes, tardive dyskinesia starts months or years after someone took the medicine. Not everyone who takes a neuroleptic medicine will get tardivedyskinesia. What are the causes? This condition is caused by changes in your brain that are associated withtaking a neuroleptic medicine. What increases the risk? If you are taking a neuroleptic medicine, your risk for tardive dyskinesia may be higher if you: Are taking an older type of neuroleptic medicine. Have been taking the medicine for a long time at a high dose. Are a woman past the age of menopause. Are older than 60. Have a history of alcohol or drug abuse. What are the signs or symptoms? Abnormal, uncontrollable movements are the main symptom of tardive dyskinesia. These types of movements may include: Grimacing. Sticking out or twisting your tongue. Making chewing or sucking sounds. Making grunting or sighing noises. Blinking your eyes. Twisting, swaying, or thrusting your body. Foot tapping or finger waving. Rapid movements of your arms or legs. How is this diagnosed? Your health care provider may suspect that you have tardive dyskinesia if: You have been taking neuroleptic medicines. You have abnormal movements that you cannot control. If you are taking a medicine that can cause tardive dyskinesia, your health care provider may screen you for early signs of the condition. This may include: Observing your body movements. Using a specific rating scale called the Abnormal Involuntary Movement Scale (AIMS). You may also have tests to rule out other conditions that cause abnormal body movements,  including: Parkinson's disease. Huntington's disease. Stroke. How is this treated? The best treatment for tardive dyskinesia is to lower the dose of your medicine or to switch to a different medicine at the first sign of abnormal and uncontrolled movements. There is no cure for long-term (chronic) tardive dyskinesia. Some medicines may help control the movements. These include: Clozapine, a medicine used to treat mental illness (antipsychotic). Some muscle relaxants. Some anti-seizure medicines. Some medicines used to treat high blood pressure. Some tranquilizers (sedatives). Follow these instructions at home:     Take over-the-counter and prescription medicines only as told by your health care provider. Do not stop or start taking any medicines without talking to your health care provider first. Do not abuse drugs or alcohol. Keep all follow-up visits as told by your health care provider. This is important. Contact a health care provider if: You are unable to eat or drink. You have had a fall. Your symptoms get worse. Summary Tardive dyskinesia is a disorder that causes uncontrollable body movements. These may include grimacing, sticking out or twisting your tongue, blinking your eyes, or rapid movements of your arms or legs. The condition occurs in some people who are taking certain medicines to treat a mental illness (neuroleptic medicine) or have taken this type of medicine in the past. The best treatment for tardive dyskinesia is to lower the dose of your medicine or to switch to a different medicine at the first sign of abnormal and uncontrolled movements. There is no cure for long-term (chronic) tardive dyskinesia, but some medicines may help control the movements. This information is not intended to replace advice given to you by your health care provider. Make sure you discuss any questions you have with your   healthcare provider. Document Revised: 09/11/2017 Document Reviewed:  09/11/2017 Elsevier Patient Education  2022 [employer]  

## 2021-06-07 ENCOUNTER — Encounter: Payer: Self-pay | Admitting: Internal Medicine

## 2021-06-07 ENCOUNTER — Other Ambulatory Visit (HOSPITAL_COMMUNITY): Payer: Self-pay

## 2021-06-07 LAB — THYROID PEROXIDASE ANTIBODY: Thyroperoxidase Ab SerPl-aCnc: <1 IU/mL (ref <9)

## 2021-06-07 LAB — THYROID PANEL WITH TSH
Free Thyroxine Index: 2.5 (ref 1.4–3.8)
T3 Uptake: 30 % (ref 22–35)
T4, Total: 8.3 ug/dL (ref 5.1–11.9)
TSH: 0.87 mIU/L

## 2021-06-07 MED ORDER — METFORMIN HCL ER 750 MG PO TB24
750.0000 mg | ORAL_TABLET | Freq: Every day | ORAL | 1 refills | Status: DC
  Filled 2021-06-07: qty 90, 90d supply, fill #0
  Filled 2021-09-25: qty 90, 90d supply, fill #1

## 2021-06-08 ENCOUNTER — Other Ambulatory Visit (HOSPITAL_COMMUNITY): Payer: Self-pay

## 2021-06-08 MED ORDER — LORAZEPAM 0.5 MG PO TABS
0.5000 mg | ORAL_TABLET | Freq: Two times a day (BID) | ORAL | 0 refills | Status: DC
  Filled 2021-06-08: qty 60, 30d supply, fill #0

## 2021-06-08 MED ORDER — LATUDA 40 MG PO TABS
40.0000 mg | ORAL_TABLET | Freq: Every day | ORAL | 1 refills | Status: DC
  Filled 2021-06-08: qty 90, 90d supply, fill #0

## 2021-06-08 MED ORDER — BENZTROPINE MESYLATE 0.5 MG PO TABS
0.5000 mg | ORAL_TABLET | Freq: Two times a day (BID) | ORAL | 3 refills | Status: DC
  Filled 2021-06-08: qty 60, 30d supply, fill #0

## 2021-06-13 LAB — HM DIABETES EYE EXAM

## 2021-06-18 ENCOUNTER — Other Ambulatory Visit (HOSPITAL_COMMUNITY): Payer: Self-pay

## 2021-06-18 MED ORDER — BENZTROPINE MESYLATE 1 MG PO TABS
1.0000 mg | ORAL_TABLET | Freq: Two times a day (BID) | ORAL | 3 refills | Status: DC
  Filled 2021-06-18: qty 60, 30d supply, fill #0

## 2021-06-25 ENCOUNTER — Other Ambulatory Visit (HOSPITAL_COMMUNITY): Payer: Self-pay

## 2021-06-25 MED ORDER — BENZTROPINE MESYLATE 1 MG PO TABS
1.0000 mg | ORAL_TABLET | Freq: Three times a day (TID) | ORAL | 3 refills | Status: DC
  Filled 2021-06-25: qty 90, 30d supply, fill #0

## 2021-07-04 ENCOUNTER — Other Ambulatory Visit (HOSPITAL_COMMUNITY): Payer: Self-pay

## 2021-07-04 MED ORDER — AUSTEDO 12 MG PO TABS
2.0000 | ORAL_TABLET | Freq: Every day | ORAL | 3 refills | Status: DC
  Filled 2021-07-04 – 2021-07-09 (×2): qty 60, 30d supply, fill #0
  Filled 2021-08-08 – 2021-08-20 (×2): qty 60, 30d supply, fill #1

## 2021-07-06 ENCOUNTER — Other Ambulatory Visit (HOSPITAL_COMMUNITY): Payer: Self-pay

## 2021-07-09 ENCOUNTER — Other Ambulatory Visit (HOSPITAL_COMMUNITY): Payer: Self-pay

## 2021-07-10 ENCOUNTER — Other Ambulatory Visit (HOSPITAL_COMMUNITY): Payer: Self-pay

## 2021-07-31 ENCOUNTER — Other Ambulatory Visit (HOSPITAL_COMMUNITY): Payer: Self-pay

## 2021-07-31 MED ORDER — LITHIUM CARBONATE 300 MG PO TABS
ORAL_TABLET | ORAL | 3 refills | Status: DC
Start: 2021-07-31 — End: 2021-10-29
  Filled 2021-07-31: qty 90, 30d supply, fill #0
  Filled 2021-09-14: qty 90, 30d supply, fill #1

## 2021-08-08 ENCOUNTER — Other Ambulatory Visit (HOSPITAL_COMMUNITY): Payer: Self-pay

## 2021-08-19 ENCOUNTER — Other Ambulatory Visit: Payer: Self-pay

## 2021-08-19 ENCOUNTER — Emergency Department (HOSPITAL_COMMUNITY)
Admission: EM | Admit: 2021-08-19 | Discharge: 2021-08-20 | Disposition: A | Discharge status: Home / Self Care | Payer: No Typology Code available for payment source | Attending: Emergency Medicine | Admitting: Emergency Medicine

## 2021-08-19 ENCOUNTER — Encounter (HOSPITAL_COMMUNITY): Payer: Self-pay | Admitting: Emergency Medicine

## 2021-08-19 DIAGNOSIS — R200 Anesthesia of skin: Secondary | ICD-10-CM | POA: Diagnosis not present

## 2021-08-19 DIAGNOSIS — R0602 Shortness of breath: Secondary | ICD-10-CM | POA: Diagnosis not present

## 2021-08-19 DIAGNOSIS — N9489 Other specified conditions associated with female genital organs and menstrual cycle: Secondary | ICD-10-CM | POA: Diagnosis not present

## 2021-08-19 DIAGNOSIS — Z5321 Procedure and treatment not carried out due to patient leaving prior to being seen by health care provider: Secondary | ICD-10-CM | POA: Diagnosis not present

## 2021-08-19 DIAGNOSIS — H0259 Other disorders affecting eyelid function: Secondary | ICD-10-CM | POA: Diagnosis present

## 2021-08-19 LAB — BASIC METABOLIC PANEL
Anion gap: 7 (ref 5–15)
BUN: 8 mg/dL (ref 6–20)
CO2: 28 mmol/L (ref 22–32)
Calcium: 9.6 mg/dL (ref 8.9–10.3)
Chloride: 102 mmol/L (ref 98–111)
Creatinine, Ser: 0.65 mg/dL (ref 0.44–1.00)
GFR, Estimated: >60 mL/min (ref >60)
Glucose, Bld: 105 mg/dL — ABNORMAL HIGH (ref 70–99)
Potassium: 4.2 mmol/L (ref 3.5–5.1)
Sodium: 137 mmol/L (ref 135–145)

## 2021-08-19 LAB — CBC
HCT: 38.8 % (ref 36.0–46.0)
Hemoglobin: 12.7 g/dL (ref 12.0–15.0)
MCH: 25.1 pg — ABNORMAL LOW (ref 26.0–34.0)
MCHC: 32.7 g/dL (ref 30.0–36.0)
MCV: 76.8 fL — ABNORMAL LOW (ref 80.0–100.0)
Platelets: 322 10*3/uL (ref 150–400)
RBC: 5.05 MIL/uL (ref 3.87–5.11)
RDW: 14.2 % (ref 11.5–15.5)
WBC: 8.9 10*3/uL (ref 4.0–10.5)
nRBC: 0 % (ref 0.0–0.2)

## 2021-08-19 LAB — I-STAT BETA HCG BLOOD, ED (MC, WL, AP ONLY): I-stat hCG, quantitative: <5 m[IU]/mL (ref <5)

## 2021-08-19 NOTE — ED Notes (Signed)
PT left per Patient Access °

## 2021-08-19 NOTE — ED Triage Notes (Signed)
Pt reports intermittent "facial paralysis" x 1 month.  Reports episodes of face "locking up" with eyes closed tight and smiling.  States her doctor thought it was related to Jordan and gradually tapered her dose down and switched her to Lithium.   Reports jaw pain.

## 2021-08-19 NOTE — ED Provider Notes (Signed)
Emergency Medicine Provider Triage Evaluation Note  Kaylee Hill , a 40 y.o. female  was evaluated in triage.  Pt complains of 'face locking up'. She states that several months ago she started having 'excessive blinking' she then started having episodes where she could not open her eyes. She is now having episodes where her face will 'freeze' and she will be unable to move her muscles. She states that this morning her face was 'frozen' with her mouth wide open and she could not move her face for 2-3 minutes. She also endorses some bilateral facial numbness when she has these attacks. She also states these attacks make it difficult to breath. She saw her PCP for these complaints 10 days ago, they suspected it was due to the Jordan she was taking, d/ced that and started Lithium. She denies any change in her symptoms since these medication changes  Review of Systems  Positive:  Negative: See above  Physical Exam  BP (!) 155/112 (BP Location: Right Arm)    Pulse 82    Temp 98.4 F (36.9 C)    Resp 18    LMP 08/05/2021    SpO2 98%  Gen:   Awake, no distress   Resp:  Normal effort  MSK:   Moves extremities without difficulty  Other:  Neurologically intact, no deficits  Medical Decision Making  Medically screening exam initiated at 3:32 PM.  Appropriate orders placed.  [name] was informed that the remainder of the evaluation will be completed by another provider, this initial triage assessment does not replace that evaluation, and the importance of remaining in the ED until their evaluation is complete.     Vear Clock 08/19/21 1537    Vanetta Mulders, MD 08/22/21 1330

## 2021-08-20 ENCOUNTER — Other Ambulatory Visit (HOSPITAL_COMMUNITY): Payer: Self-pay

## 2021-08-21 ENCOUNTER — Other Ambulatory Visit (HOSPITAL_COMMUNITY): Payer: Self-pay

## 2021-08-22 ENCOUNTER — Other Ambulatory Visit (HOSPITAL_COMMUNITY): Payer: Self-pay

## 2021-09-07 ENCOUNTER — Other Ambulatory Visit (HOSPITAL_COMMUNITY): Payer: Self-pay

## 2021-09-07 MED ORDER — LORAZEPAM 0.5 MG PO TABS
0.5000 mg | ORAL_TABLET | Freq: Two times a day (BID) | ORAL | 3 refills | Status: DC
  Filled 2021-09-07: qty 60, 30d supply, fill #0
  Filled 2021-10-15: qty 60, 30d supply, fill #1

## 2021-09-13 ENCOUNTER — Other Ambulatory Visit (HOSPITAL_COMMUNITY): Payer: Self-pay

## 2021-09-13 MED ORDER — AUSTEDO 9 MG PO TABS
18.0000 mg | ORAL_TABLET | Freq: Two times a day (BID) | ORAL | 3 refills | Status: DC
  Filled 2021-09-13: qty 120, 30d supply, fill #0
  Filled 2021-10-15: qty 120, 30d supply, fill #1

## 2021-09-14 ENCOUNTER — Other Ambulatory Visit (HOSPITAL_COMMUNITY): Payer: Self-pay

## 2021-09-25 ENCOUNTER — Other Ambulatory Visit (HOSPITAL_COMMUNITY): Payer: Self-pay

## 2021-10-03 ENCOUNTER — Other Ambulatory Visit (HOSPITAL_COMMUNITY): Payer: Self-pay

## 2021-10-03 ENCOUNTER — Encounter (HOSPITAL_COMMUNITY): Payer: Self-pay

## 2021-10-03 ENCOUNTER — Ambulatory Visit (INDEPENDENT_AMBULATORY_CARE_PROVIDER_SITE_OTHER): Payer: No Typology Code available for payment source

## 2021-10-03 ENCOUNTER — Ambulatory Visit (HOSPITAL_COMMUNITY)
Admission: EM | Admit: 2021-10-03 | Discharge: 2021-10-03 | Disposition: A | Discharge status: Home / Self Care | Payer: No Typology Code available for payment source | Attending: Physician Assistant | Admitting: Physician Assistant

## 2021-10-03 ENCOUNTER — Other Ambulatory Visit: Payer: Self-pay

## 2021-10-03 DIAGNOSIS — R0602 Shortness of breath: Secondary | ICD-10-CM | POA: Diagnosis present

## 2021-10-03 DIAGNOSIS — J189 Pneumonia, unspecified organism: Secondary | ICD-10-CM | POA: Insufficient documentation

## 2021-10-03 LAB — CBC WITH DIFFERENTIAL/PLATELET
Abs Immature Granulocytes: 0.06 10*3/uL (ref 0.00–0.07)
Basophils Absolute: 0.1 10*3/uL (ref 0.0–0.1)
Basophils Relative: 1 %
Eosinophils Absolute: 0.3 10*3/uL (ref 0.0–0.5)
Eosinophils Relative: 3 %
HCT: 36.2 % (ref 36.0–46.0)
Hemoglobin: 12 g/dL (ref 12.0–15.0)
Immature Granulocytes: 1 %
Lymphocytes Relative: 30 %
Lymphs Abs: 2.9 10*3/uL (ref 0.7–4.0)
MCH: 25.5 pg — ABNORMAL LOW (ref 26.0–34.0)
MCHC: 33.1 g/dL (ref 30.0–36.0)
MCV: 76.9 fL — ABNORMAL LOW (ref 80.0–100.0)
Monocytes Absolute: 0.8 10*3/uL (ref 0.1–1.0)
Monocytes Relative: 8 %
Neutro Abs: 5.6 10*3/uL (ref 1.7–7.7)
Neutrophils Relative %: 57 %
Platelets: 320 10*3/uL (ref 150–400)
RBC: 4.71 MIL/uL (ref 3.87–5.11)
RDW: 14.5 % (ref 11.5–15.5)
WBC: 9.7 10*3/uL (ref 4.0–10.5)
nRBC: 0 % (ref 0.0–0.2)

## 2021-10-03 LAB — COMPREHENSIVE METABOLIC PANEL
ALT: 18 U/L (ref 0–44)
AST: 23 U/L (ref 15–41)
Albumin: 3.5 g/dL (ref 3.5–5.0)
Alkaline Phosphatase: 68 U/L (ref 38–126)
Anion gap: 8 (ref 5–15)
BUN: 5 mg/dL — ABNORMAL LOW (ref 6–20)
CO2: 27 mmol/L (ref 22–32)
Calcium: 9.2 mg/dL (ref 8.9–10.3)
Chloride: 101 mmol/L (ref 98–111)
Creatinine, Ser: 0.6 mg/dL (ref 0.44–1.00)
GFR, Estimated: >60 mL/min (ref >60)
Glucose, Bld: 128 mg/dL — ABNORMAL HIGH (ref 70–99)
Potassium: 3.6 mmol/L (ref 3.5–5.1)
Sodium: 136 mmol/L (ref 135–145)
Total Bilirubin: 0.5 mg/dL (ref 0.3–1.2)
Total Protein: 7.8 g/dL (ref 6.5–8.1)

## 2021-10-03 LAB — BRAIN NATRIURETIC PEPTIDE: B Natriuretic Peptide: 17 pg/mL (ref 0.0–100.0)

## 2021-10-03 MED ORDER — DOXYCYCLINE HYCLATE 100 MG PO CAPS
100.0000 mg | ORAL_CAPSULE | Freq: Two times a day (BID) | ORAL | 0 refills | Status: DC
  Filled 2021-10-03: qty 20, 10d supply, fill #0

## 2021-10-03 MED ORDER — AMOXICILLIN-POT CLAVULANATE 875-125 MG PO TABS
1.0000 | ORAL_TABLET | Freq: Two times a day (BID) | ORAL | 0 refills | Status: AC
  Filled 2021-10-03: qty 20, 10d supply, fill #0

## 2021-10-03 NOTE — Discharge Instructions (Addendum)
Your chest x-ray showed the beginning of a pneumonia.  I am concerned this is the cause of your symptoms.  Please start Augmentin twice daily and doxycycline twice daily to cover for infection.  We will contact you if your lab work is abnormal.  Please follow-up with either our clinic or your primary care provider next week for recheck.  If you have any worsening symptoms including chest pain, worsening shortness of breath, high fever, nausea/vomiting you need to go to the emergency room.

## 2021-10-03 NOTE — ED Provider Notes (Signed)
[facility]    CSN: [id number] Arrival date & time: 10/03/21  1543      History   Chief Complaint Chief Complaint  Patient presents with   Shortness of Breath    HPI Kaylee Hill is a 41 y.o. female.   Patient presents today with a several month history of worsening shortness of breath and dyspnea on exertion.  Reports that whenever she is active she has this facial spasm that causes involuntary eyelid closure.  Initially shortness of breath symptoms were linked to facial spasms but now these episodes are independent.  Reports that she is now taking twice as long (7 minutes when it used to take her 3 minutes) to walk across campus as result of symptoms.  Reports that she will be feeling well and developed shortness of breath which causes her to stop at which point she gasp for air which will lead her to panic.  She is confident that symptoms are not related to a panic attack as panic follows the shortness of breath and not the other way around.  She denies any chest pain or palpitations.  Denies any dizziness or syncope.  She denies any recent dietary changes and is ensuring that she drinks plenty of fluid.  Denies any medication changes.  She denies history of cardiovascular disease and is unsure if there is anyone in her family with CVD.  She is a former smoker but only smoked for 1 year and quit many years ago.  She denies history of chronic lung condition including asthma or COPD.  She denies any history of anemia or blood loss including menorrhagia, melena, hematochezia, hematuria.  She does have a history of diabetes, hyperlipidemia, hypertension.  She denies any recent illness or COVID-19 infection.   Past Medical History:  Diagnosis Date   Anxiety    Bipolar 1 disorder ([place])    Depression    Gestational diabetes    Headache    Hypertension     Patient Active Problem List   Diagnosis Date Noted   Numbness and tingling in both hands 06/06/2021   Excessive  involuntary blinking 06/06/2021   Seasonal allergic rhinitis due to pollen 06/06/2021   Non-recurrent acute serous otitis media of right ear 06/06/2021   Need for vaccination 02/07/2021   Routine general medical examination at a health care facility 02/07/2021   Type II diabetes mellitus with manifestations ([place]) 02/05/2021   B12 deficiency 02/05/2021   Vitreomacular adhesion of both eyes 11/28/2020   OSA (obstructive sleep apnea) 11/28/2020   Hyperlipidemia associated with type 2 diabetes mellitus ([place]) 04/17/2020   Arthropathy of lumbar facet joint 06/01/2019   Primary osteoarthritis of both knees 06/01/2019   Vitamin D deficiency 08/12/2018   Iron deficiency anemia, unspecified 08/07/2018   Class 3 obesity ([place]) 05/20/2018   Diabetes mellitus with no complication ([place]) 0000000   Bipolar disorder with psychotic features ([place]) 04/28/2017   Sickle cell trait ([place]) 12/09/2014   Benign essential hypertension 04/09/2013   Familial multiple lipoprotein-type hyperlipidemia 04/09/2013    Past Surgical History:  Procedure Laterality Date   BREAST SURGERY      OB History     Gravida  1   Para  1   Term  1   Preterm      AB      Living  1      SAB      IAB      Ectopic      Multiple  Live Births  1            Home Medications    Prior to Admission medications   Medication Sig Start Date End Date Taking? Authorizing Provider  amoxicillin-clavulanate (AUGMENTIN) 875-125 MG tablet Take 1 tablet by mouth every 12 (twelve) hours for 10 days. 10/03/21 10/13/21 Yes Daphney Hopke K, PA-C  Deutetrabenazine (AUSTEDO) 12 MG TABS Take 2 tablets by mouth daily. 07/04/21  Yes   Deutetrabenazine (AUSTEDO) 9 MG TABS Take 2 tablets (18 mg) by mouth 2 (two) times daily. 09/13/21  Yes   doxycycline (VIBRAMYCIN) 100 MG capsule Take 1 capsule (100 mg total) by mouth 2 (two) times daily. 10/03/21  Yes Osman Calzadilla K, PA-C  lithium 300 MG tablet Take 1 tablet (300 mg total) by mouth  at bedtime for 4 days, THEN 2 tablets (600 mg total) at bedtime for 4 days, THEN 3 tablets (900 mg total) at bedtime. 07/31/21 10/14/21 Yes   LORazepam (ATIVAN) 0.5 MG tablet Take 1 tablet (0.5 mg total) by mouth 2 (two) times daily as needed for anxiety 06/08/21  Yes   LORazepam (ATIVAN) 0.5 MG tablet Take 1 tablet (0.5 mg total) by mouth 2 (two) times daily. 09/07/21  Yes   lurasidone (LATUDA) 40 MG TABS tablet Take 1 tablet (40 mg total) by mouth daily with a meal 06/08/21  Yes   Lurasidone HCl (LATUDA) 60 MG TABS take 1 tablet by mouth daily with a meal 12/04/20  Yes   metFORMIN (GLUCOPHAGE XR) 750 MG 24 hr tablet Take 1 tablet (750 mg total) by mouth daily with breakfast. 06/07/21  Yes Janith Lima, MD  Multiple Vitamin (MULTIVITAMIN) tablet Take by mouth daily. Chew 2 per day   Yes [provider]  nebivolol (BYSTOLIC) 5 MG tablet Take 1 tablet (5 mg total) by mouth daily. 02/07/21  Yes Janith Lima, MD  traZODone (DESYREL) 50 MG tablet take 1/2 - 2 tablet by mouth at night as needed for insomnia 12/04/20  Yes   benztropine (COGENTIN) 0.5 MG tablet Take 1 tablet (0.5 mg total) by mouth 2 (two) times daily. 06/08/21     benztropine (COGENTIN) 1 MG tablet Take 1 tablet (1 mg total) by mouth 2 (two) times daily. 06/18/21     benztropine (COGENTIN) 1 MG tablet Take 1 tablet (1 mg total) by mouth 3 (three) times daily. 06/25/21       Family History Family History  Problem Relation Age of Onset   Bipolar disorder Mother    Diabetes Father    Hypertension Father    Schizophrenia Father    Asthma Brother    Healthy Daughter    Diabetes Maternal Grandmother    Cancer Maternal Grandmother        breast, pancreatic cancer   Dementia Maternal Grandfather     Social History Social History   Tobacco Use   Smoking status: Former    Packs/day: 0.10    Years: 1.00    Pack years: 0.10    Types: Cigarettes    Quit date: 2000    Years since quitting: 23.1   Smokeless tobacco: Never   Vaping Use   Vaping Use: Never used  Substance Use Topics   Alcohol use: No   Drug use: No     Allergies   Patient has no known allergies.   Review of Systems Review of Systems  Constitutional:  Positive for activity change. Negative for appetite change, fatigue and fever.  HENT:  Negative  for congestion, sinus pressure, sneezing and sore throat.   Respiratory:  Positive for shortness of breath. Negative for cough.   Cardiovascular:  Negative for chest pain.  Gastrointestinal:  Negative for abdominal pain, diarrhea, nausea and vomiting.  Neurological:  Negative for dizziness, light-headedness and headaches.    Physical Exam Triage Vital Signs ED Triage Vitals [10/03/21 1557]  Enc Vitals Group     BP (!) 143/86     Pulse Rate 76     Resp 18     Temp 98.3 F (36.8 C)     Temp Source Oral     SpO2 100 %     Weight      Height      Head Circumference      Peak Flow      Pain Score      Pain Loc      Pain Edu?      Excl. in [place]?    No data found.  Updated Vital Signs BP (!) 143/86 (BP Location: Left Arm)    Pulse 76    Temp 98.3 F (36.8 C) (Oral)    Resp 18    LMP  (LMP Unknown)    SpO2 100%   Visual Acuity Right Eye Distance:   Left Eye Distance:   Bilateral Distance:    Right Eye Near:   Left Eye Near:    Bilateral Near:     Physical Exam Vitals reviewed.  Constitutional:      General: She is awake. She is not in acute distress.    Appearance: Normal appearance. She is well-developed. She is not ill-appearing.     Comments: Very pleasant female appears stated age in no acute distress  HENT:     Head: Normocephalic and atraumatic.     Right Ear: External ear normal.     Left Ear: External ear normal.     Mouth/Throat:     Mouth: Mucous membranes are moist.     Pharynx: Uvula midline. No oropharyngeal exudate or posterior oropharyngeal erythema.  Eyes:     Extraocular Movements: Extraocular movements intact.  Cardiovascular:     Rate and Rhythm:  Normal rate and regular rhythm.     Heart sounds: Normal heart sounds, S1 normal and S2 normal. No murmur heard. Pulmonary:     Effort: Pulmonary effort is normal.     Breath sounds: Normal breath sounds. No wheezing, rhonchi or rales.     Comments: Clear to auscultation bilaterally Psychiatric:        Behavior: Behavior is cooperative.     UC Treatments / Results  Labs (all labs ordered are listed, but only abnormal results are displayed) Labs Reviewed  CBC WITH DIFFERENTIAL/PLATELET  COMPREHENSIVE METABOLIC PANEL  BRAIN NATRIURETIC PEPTIDE    EKG   Radiology DG Chest 2 View  Result Date: 10/03/2021 CLINICAL DATA:  Short of breath EXAM: CHEST - 2 VIEW COMPARISON:  None. FINDINGS: Heart size and vascularity normal.  No effusion Small area of patchy airspace disease right upper lobe. Remaining lungs clear. IMPRESSION: Small infiltrate right upper lobe. Correlate with symptoms of pneumonia. Electronically Signed   By: Franchot Gallo M.D.   On: 10/03/2021 17:15    Procedures Procedures (including critical care time)  Medications Ordered in UC Medications - No data to display  Initial Impression / Assessment and Plan / UC Course  I have reviewed the triage vital signs and the nursing notes.  Pertinent labs & imaging results that were available  during my care of the patient were reviewed by me and considered in my medical decision making (see chart for details).     EKG obtained showed with ventricular rate of 69 bpm changes; in leads III and V1 compared to 08/21/2021 tracing.  Chest x-ray obtained showed small right upper lobe infiltrate and given associated shortness of breath will cover for CAP with Augmentin and doxycycline.  I am not convinced that this is the sole cause of her symptoms given clinical presentation and did recommend that she follow-up with cardiology if symptoms do not improve quickly with antibiotics.  She was given contact information for local provider  encouraged her to schedule an appointment with them.  CURB65 score of 0 no indication for inpatient admission based on presentation today.  Recommend she follow-up with PCP within a few weeks for recheck and discussed that she would likely need additional x-ray in 4 to 6 weeks to ensure clearing of infection.  CBC, CMP, BNP obtained today-results pending.  We will contact patient with any abnormalities.  She was encouraged to rest and drink plenty of fluid.  She was provided work and school excuse note.  Discussed alarm symptoms that warrant emergent evaluation including high fever, worsening shortness of breath, chest pain, nausea/vomiting, lethargy.  Strict return precautions given to which she expressed understanding.  Final Clinical Impressions(s) / UC Diagnoses   Final diagnoses:  Community acquired pneumonia of right upper lobe of lung  Shortness of breath     Discharge Instructions      Your chest x-ray showed the beginning of a pneumonia.  I am concerned this is the cause of your symptoms.  Please start Augmentin twice daily and doxycycline twice daily to cover for infection.  We will contact you if your lab work is abnormal.  Please follow-up with either our clinic or your primary care provider next week for recheck.  If you have any worsening symptoms including chest pain, worsening shortness of breath, high fever, nausea/vomiting you need to go to the emergency room.     ED Prescriptions     Medication Sig Dispense Auth. Provider   amoxicillin-clavulanate (AUGMENTIN) 875-125 MG tablet Take 1 tablet by mouth every 12 (twelve) hours for 10 days. 20 tablet Daemon Dowty K, PA-C   doxycycline (VIBRAMYCIN) 100 MG capsule Take 1 capsule (100 mg total) by mouth 2 (two) times daily. 20 capsule Keontay Vora, Derry Skill, PA-C      PDMP not reviewed this encounter.   Terrilee Croak, PA-C 10/03/21 1735

## 2021-10-03 NOTE — ED Triage Notes (Signed)
Pt reports eye close up and she starts gasping for breath. This started several weeks ago.

## 2021-10-08 ENCOUNTER — Ambulatory Visit (HOSPITAL_COMMUNITY)
Admission: EM | Admit: 2021-10-08 | Discharge: 2021-10-08 | Disposition: A | Discharge status: Home / Self Care | Payer: No Typology Code available for payment source

## 2021-10-08 ENCOUNTER — Encounter (HOSPITAL_COMMUNITY): Payer: Self-pay | Admitting: *Deleted

## 2021-10-08 ENCOUNTER — Other Ambulatory Visit: Payer: Self-pay

## 2021-10-08 DIAGNOSIS — J189 Pneumonia, unspecified organism: Secondary | ICD-10-CM | POA: Diagnosis not present

## 2021-10-08 DIAGNOSIS — R0602 Shortness of breath: Secondary | ICD-10-CM

## 2021-10-08 NOTE — ED Triage Notes (Signed)
Pt reports Shortness of breath is worse. Pt reports SHOB while eating and talking.

## 2021-10-08 NOTE — Discharge Instructions (Signed)
Please report to the emergency room as you are in need of a higher level of care than we can provide in the urgent care setting. You are failing outpatient treatment with Augmentin and doxycycline for your pneumonia as you report that you are getting worse including your shortness of breath. This will require a higher level of care than we can provide in the urgent care setting including consideration for a chest CT scan.

## 2021-10-08 NOTE — ED Provider Notes (Signed)
[facility]   MRN: [id number] DOB: 1980-11-03  Subjective:   Kaylee Hill is a 41 y.o. female with pmh of OSA, DM II, Bipolar disorder I presenting for recheck on her shortness of breath. Patient was diagnosed with community acquired pneumonia of right upper lobe as seen on her chest x-ray on 10/03/2021. Was started on Augmentin. Patient also had labs drawn that were unremarkable. Results from her 10/03/2021 visit are as below. No history of pulmonary embolism.  Unfortunately, patient reports that today she is dramatically worse.  States that she cannot take more than a few steps without starting to gasp for air and then even sitting and resting is difficult for her with her breathing.  She has no history of asthma or COPD.  She is not a smoker.    No current facility-administered medications for this encounter.  Current Outpatient Medications:    amoxicillin-clavulanate (AUGMENTIN) 875-125 MG tablet, Take 1 tablet by mouth every 12 (twelve) hours for 10 days., Disp: 20 tablet, Rfl: 0   benztropine (COGENTIN) 0.5 MG tablet, Take 1 tablet (0.5 mg total) by mouth 2 (two) times daily., Disp: 60 tablet, Rfl: 3   benztropine (COGENTIN) 1 MG tablet, Take 1 tablet (1 mg total) by mouth 2 (two) times daily., Disp: 60 tablet, Rfl: 3   benztropine (COGENTIN) 1 MG tablet, Take 1 tablet (1 mg total) by mouth 3 (three) times daily., Disp: 90 tablet, Rfl: 3   Deutetrabenazine (AUSTEDO) 12 MG TABS, Take 2 tablets by mouth daily., Disp: 60 tablet, Rfl: 3   Deutetrabenazine (AUSTEDO) 9 MG TABS, Take 2 tablets (18 mg) by mouth 2 (two) times daily., Disp: 120 tablet, Rfl: 3   doxycycline (VIBRAMYCIN) 100 MG capsule, Take 1 capsule (100 mg total) by mouth 2 (two) times daily., Disp: 20 capsule, Rfl: 0   lithium 300 MG tablet, Take 1 tablet (300 mg total) by mouth at bedtime for 4 days, THEN 2 tablets (600 mg total) at bedtime for 4 days, THEN 3 tablets (900 mg total) at bedtime., Disp: 90  tablet, Rfl: 3   LORazepam (ATIVAN) 0.5 MG tablet, Take 1 tablet (0.5 mg total) by mouth 2 (two) times daily as needed for anxiety, Disp: 60 tablet, Rfl: 0   LORazepam (ATIVAN) 0.5 MG tablet, Take 1 tablet (0.5 mg total) by mouth 2 (two) times daily., Disp: 60 tablet, Rfl: 3   lurasidone (LATUDA) 40 MG TABS tablet, Take 1 tablet (40 mg total) by mouth daily with a meal, Disp: 90 tablet, Rfl: 1   Lurasidone HCl (LATUDA) 60 MG TABS, take 1 tablet by mouth daily with a meal, Disp: 30 tablet, Rfl: 3   metFORMIN (GLUCOPHAGE XR) 750 MG 24 hr tablet, Take 1 tablet (750 mg total) by mouth daily with breakfast., Disp: 90 tablet, Rfl: 1   Multiple Vitamin (MULTIVITAMIN) tablet, Take by mouth daily. Chew 2 per day, Disp: , Rfl:    nebivolol (BYSTOLIC) 5 MG tablet, Take 1 tablet (5 mg total) by mouth daily., Disp: 90 tablet, Rfl: 1   traZODone (DESYREL) 50 MG tablet, take 1/2 - 2 tablet by mouth at night as needed for insomnia, Disp: 180 tablet, Rfl: 1   No Known Allergies  Past Medical History:  Diagnosis Date   Anxiety    Bipolar 1 disorder ([facility])    Depression    Gestational diabetes    Headache    Hypertension      Past Surgical History:  Procedure Laterality Date  BREAST SURGERY      Family History  Problem Relation Age of Onset   Bipolar disorder Mother    Diabetes Father    Hypertension Father    Schizophrenia Father    Asthma Brother    Healthy Daughter    Diabetes Maternal Grandmother    Cancer Maternal Grandmother        breast, pancreatic cancer   Dementia Maternal Grandfather     Social History   Tobacco Use   Smoking status: Former    Packs/day: 0.10    Years: 1.00    Pack years: 0.10    Types: Cigarettes    Quit date: 2000    Years since quitting: 23.1   Smokeless tobacco: Never  Vaping Use   Vaping Use: Never used  Substance Use Topics   Alcohol use: No   Drug use: No    ROS   Objective:   Vitals: BP 125/74    Pulse 64    Temp 98.4 F (36.9 C)     Resp 20    LMP 09/07/2021 (Approximate)    SpO2 100%   Physical Exam Constitutional:      General: She is not in acute distress.    Appearance: Normal appearance. She is well-developed. She is not ill-appearing, toxic-appearing or diaphoretic.  HENT:     Head: Normocephalic and atraumatic.     Nose: Nose normal.     Mouth/Throat:     Mouth: Mucous membranes are moist.  Eyes:     General: No scleral icterus.       Right eye: No discharge.        Left eye: No discharge.     Extraocular Movements: Extraocular movements intact.  Cardiovascular:     Rate and Rhythm: Normal rate.     Heart sounds: No murmur heard.   No friction rub. No gallop.  Pulmonary:     Effort: Pulmonary effort is normal. No respiratory distress.     Breath sounds: No stridor. No decreased breath sounds, wheezing, rhonchi or rales.  Chest:     Chest wall: No tenderness.  Skin:    General: Skin is warm and dry.  Neurological:     General: No focal deficit present.     Mental Status: She is alert and oriented to person, place, and time.  Psychiatric:        Mood and Affect: Mood normal.        Behavior: Behavior normal.    DG Chest 2 View  Result Date: 10/03/2021 CLINICAL DATA:  Short of breath EXAM: CHEST - 2 VIEW COMPARISON:  None. FINDINGS: Heart size and vascularity normal.  No effusion Small area of patchy airspace disease right upper lobe. Remaining lungs clear. IMPRESSION: Small infiltrate right upper lobe. Correlate with symptoms of pneumonia. Electronically Signed   By: Marlan Palau M.D.   On: 10/03/2021 17:15    Recent Results (from the past 2160 hour(s))  Basic metabolic panel     Status: Abnormal   Collection Time: 08/19/21  2:31 PM  Result Value Ref Range   Sodium 137 135 - 145 mmol/L   Potassium 4.2 3.5 - 5.1 mmol/L   Chloride 102 98 - 111 mmol/L   CO2 28 22 - 32 mmol/L   Glucose, Bld 105 (H) 70 - 99 mg/dL    Comment: Glucose reference range applies only to samples taken after fasting for  at least 8 hours.   BUN 8 6 - 20 mg/dL  Creatinine, Ser 0.65 0.44 - 1.00 mg/dL   Calcium 9.6 8.9 - 33.8 mg/dL   GFR, Estimated >25 >05 mL/min    Comment: (NOTE) Calculated using the CKD-EPI Creatinine Equation (2021)    Anion gap 7 5 - 15    Comment: Performed at [facility], [address], [place], [place] [address]  CBC     Status: Abnormal   Collection Time: 08/19/21  2:31 PM  Result Value Ref Range   WBC 8.9 4.0 - 10.5 K/uL   RBC 5.05 3.87 - 5.11 MIL/uL   Hemoglobin 12.7 12.0 - 15.0 g/dL   HCT 34.1 93.7 - 90.2 %   MCV 76.8 (L) 80.0 - 100.0 fL   MCH 25.1 (L) 26.0 - 34.0 pg   MCHC 32.7 30.0 - 36.0 g/dL   RDW 40.9 73.5 - 32.9 %   Platelets 322 150 - 400 K/uL   nRBC 0.0 0.0 - 0.2 %    Comment: Performed at [facility], [address] [place], [place] [address]  I-Stat beta hCG blood, ED     Status: None   Collection Time: 08/19/21  2:45 PM  Result Value Ref Range   I-stat hCG, quantitative <5.0 <5 mIU/mL   Comment 3            Comment:   GEST. AGE      CONC.  (mIU/mL)   <=1 WEEK        5 - 50     2 WEEKS       50 - 500     3 WEEKS       100 - 10,000     4 WEEKS     1,000 - 30,000        FEMALE AND NON-PREGNANT FEMALE:     LESS THAN 5 mIU/mL   CBC with Differential     Status: Abnormal   Collection Time: 10/03/21  4:37 PM  Result Value Ref Range   WBC 9.7 4.0 - 10.5 K/uL   RBC 4.71 3.87 - 5.11 MIL/uL   Hemoglobin 12.0 12.0 - 15.0 g/dL   HCT 83.4 19.6 - 22.2 %   MCV 76.9 (L) 80.0 - 100.0 fL   MCH 25.5 (L) 26.0 - 34.0 pg   MCHC 33.1 30.0 - 36.0 g/dL   RDW 97.9 89.2 - 11.9 %   Platelets 320 150 - 400 K/uL   nRBC 0.0 0.0 - 0.2 %   Neutrophils Relative % 57 %   Neutro Abs 5.6 1.7 - 7.7 K/uL   Lymphocytes Relative 30 %   Lymphs Abs 2.9 0.7 - 4.0 K/uL   Monocytes Relative 8 %   Monocytes Absolute 0.8 0.1 - 1.0 K/uL   Eosinophils Relative 3 %   Eosinophils Absolute 0.3 0.0 - 0.5 K/uL   Basophils Relative 1 %   Basophils Absolute 0.1 0.0 - 0.1 K/uL    Immature Granulocytes 1 %   Abs Immature Granulocytes 0.06 0.00 - 0.07 K/uL    Comment: Performed at [facility], [address] [place], [place] [address]  Comprehensive metabolic panel     Status: Abnormal   Collection Time: 10/03/21  4:37 PM  Result Value Ref Range   Sodium 136 135 - 145 mmol/L   Potassium 3.6 3.5 - 5.1 mmol/L   Chloride 101 98 - 111 mmol/L   CO2 27 22 - 32 mmol/L   Glucose, Bld 128 (H) 70 - 99 mg/dL  Comment: Glucose reference range applies only to samples taken after fasting for at least 8 hours.   BUN 5 (L) 6 - 20 mg/dL   Creatinine, Ser 4.740.60 0.44 - 1.00 mg/dL   Calcium 9.2 8.9 - 25.910.3 mg/dL   Total Protein 7.8 6.5 - 8.1 g/dL   Albumin 3.5 3.5 - 5.0 g/dL   AST 23 15 - 41 U/L   ALT 18 0 - 44 U/L   Alkaline Phosphatase 68 38 - 126 U/L   Total Bilirubin 0.5 0.3 - 1.2 mg/dL   GFR, Estimated >56>60 >38>60 mL/min    Comment: (NOTE) Calculated using the CKD-EPI Creatinine Equation (2021)    Anion gap 8 5 - 15    Comment: Performed at [facility], [address] [place], [place] [address]  Brain natriuretic peptide     Status: None   Collection Time: 10/03/21  4:37 PM  Result Value Ref Range   B Natriuretic Peptide 17.0 0.0 - 100.0 pg/mL    Comment: Performed at [facility], [address] [place], [place] [address]     Assessment and Plan :   PDMP not reviewed this encounter.  1. Shortness of breath   2. Community acquired pneumonia of right upper lobe of lung    Patient has never had a pulmonary embolism and has a long score for pulmonary embolism.  However given her report of significant worsening of her shortness of breath she will need a higher level of evaluation and care than we can provide in the urgent care setting. PA Denny Peonrin did an amazing job of working up patient extensively.  I reviewed all results again with patient and as she is significantly worse despite double antibiotic coverage for her community-acquired pneumonia, I am  recommending that she present to the hospital now for further work up and intervention.  Patient is hemodynamically stable and can present there by personal vehicle.   Wallis BambergMani, Gurtej Noyola, [place] JerseyPA-C 10/08/21 1753

## 2021-10-11 ENCOUNTER — Other Ambulatory Visit (HOSPITAL_COMMUNITY): Payer: Self-pay

## 2021-10-11 ENCOUNTER — Emergency Department (HOSPITAL_COMMUNITY): Payer: No Typology Code available for payment source

## 2021-10-11 ENCOUNTER — Encounter (HOSPITAL_COMMUNITY): Payer: Self-pay

## 2021-10-11 ENCOUNTER — Emergency Department (HOSPITAL_COMMUNITY)
Admission: EM | Admit: 2021-10-11 | Discharge: 2021-10-11 | Disposition: A | Discharge status: Home / Self Care | Payer: No Typology Code available for payment source | Attending: Emergency Medicine | Admitting: Emergency Medicine

## 2021-10-11 DIAGNOSIS — Z20822 Contact with and (suspected) exposure to covid-19: Secondary | ICD-10-CM | POA: Diagnosis not present

## 2021-10-11 DIAGNOSIS — J181 Lobar pneumonia, unspecified organism: Secondary | ICD-10-CM | POA: Insufficient documentation

## 2021-10-11 DIAGNOSIS — Z79899 Other long term (current) drug therapy: Secondary | ICD-10-CM | POA: Insufficient documentation

## 2021-10-11 DIAGNOSIS — J189 Pneumonia, unspecified organism: Secondary | ICD-10-CM

## 2021-10-11 DIAGNOSIS — R0602 Shortness of breath: Secondary | ICD-10-CM | POA: Diagnosis present

## 2021-10-11 LAB — LITHIUM LEVEL: Lithium Lvl: 0.42 mmol/L — ABNORMAL LOW (ref 0.60–1.20)

## 2021-10-11 LAB — RESP PANEL BY RT-PCR (FLU A&B, COVID) ARPGX2
Influenza A by PCR: NEGATIVE
Influenza B by PCR: NEGATIVE
SARS Coronavirus 2 by RT PCR: NEGATIVE

## 2021-10-11 LAB — CBC WITH DIFFERENTIAL/PLATELET
Abs Immature Granulocytes: 0.02 10*3/uL (ref 0.00–0.07)
Basophils Absolute: 0 10*3/uL (ref 0.0–0.1)
Basophils Relative: 0 %
Eosinophils Absolute: 0.2 10*3/uL (ref 0.0–0.5)
Eosinophils Relative: 3 %
HCT: 35.7 % — ABNORMAL LOW (ref 36.0–46.0)
Hemoglobin: 11.8 g/dL — ABNORMAL LOW (ref 12.0–15.0)
Immature Granulocytes: 0 %
Lymphocytes Relative: 31 %
Lymphs Abs: 2.2 10*3/uL (ref 0.7–4.0)
MCH: 25.3 pg — ABNORMAL LOW (ref 26.0–34.0)
MCHC: 33.1 g/dL (ref 30.0–36.0)
MCV: 76.6 fL — ABNORMAL LOW (ref 80.0–100.0)
Monocytes Absolute: 0.7 10*3/uL (ref 0.1–1.0)
Monocytes Relative: 10 %
Neutro Abs: 4 10*3/uL (ref 1.7–7.7)
Neutrophils Relative %: 56 %
Platelets: 291 10*3/uL (ref 150–400)
RBC: 4.66 MIL/uL (ref 3.87–5.11)
RDW: 14.1 % (ref 11.5–15.5)
WBC: 7.1 10*3/uL (ref 4.0–10.5)
nRBC: 0 % (ref 0.0–0.2)

## 2021-10-11 LAB — COMPREHENSIVE METABOLIC PANEL
ALT: 15 U/L (ref 0–44)
AST: 20 U/L (ref 15–41)
Albumin: 3.8 g/dL (ref 3.5–5.0)
Alkaline Phosphatase: 69 U/L (ref 38–126)
Anion gap: 6 (ref 5–15)
BUN: 5 mg/dL — ABNORMAL LOW (ref 6–20)
CO2: 29 mmol/L (ref 22–32)
Calcium: 8.7 mg/dL — ABNORMAL LOW (ref 8.9–10.3)
Chloride: 103 mmol/L (ref 98–111)
Creatinine, Ser: 0.49 mg/dL (ref 0.44–1.00)
GFR, Estimated: >60 mL/min (ref >60)
Glucose, Bld: 129 mg/dL — ABNORMAL HIGH (ref 70–99)
Potassium: 3.6 mmol/L (ref 3.5–5.1)
Sodium: 138 mmol/L (ref 135–145)
Total Bilirubin: 0.3 mg/dL (ref 0.3–1.2)
Total Protein: 7.8 g/dL (ref 6.5–8.1)

## 2021-10-11 LAB — TROPONIN I (HIGH SENSITIVITY): Troponin I (High Sensitivity): 3 ng/L (ref <18)

## 2021-10-11 LAB — HCG, QUANTITATIVE, PREGNANCY: hCG, Beta Chain, Quant, S: <1 m[IU]/mL (ref <5)

## 2021-10-11 MED ORDER — IOHEXOL 300 MG/ML  SOLN: 100.0000 mL | Freq: Once | INTRAMUSCULAR | Status: DC | PRN

## 2021-10-11 MED ORDER — PREDNISONE 20 MG PO TABS
60.0000 mg | ORAL_TABLET | Freq: Once | ORAL | Status: AC
Start: 2021-10-11 — End: 2021-10-11
  Administered 2021-10-11: 60 mg via ORAL
  Filled 2021-10-11: qty 3

## 2021-10-11 MED ORDER — IOHEXOL 350 MG/ML SOLN
100.0000 mL | Freq: Once | INTRAVENOUS | Status: AC | PRN
  Administered 2021-10-11: 80 mL via INTRAVENOUS

## 2021-10-11 MED ORDER — PREDNISONE 20 MG PO TABS
ORAL_TABLET | ORAL | 0 refills | Status: DC
  Filled 2021-10-11 (×2): qty 8, 4d supply, fill #0

## 2021-10-11 MED ORDER — ALBUTEROL SULFATE HFA 108 (90 BASE) MCG/ACT IN AERS
2.0000 | INHALATION_SPRAY | Freq: Once | RESPIRATORY_TRACT | Status: AC
  Administered 2021-10-11: 2 via RESPIRATORY_TRACT
  Filled 2021-10-11: qty 6.7

## 2021-10-11 NOTE — ED Triage Notes (Signed)
Pt presents with c/o shortness of breath. Pt reports that she has been short of breath for several weeks, diagnosed with pneumonia on 2/1.

## 2021-10-11 NOTE — Discharge Instructions (Signed)
You can use the inhaler at home up to every 4 hours while you are awake.  If you need to use the inhaler more often then he need to return to the emergency department for repeat evaluation.  Please call the pulmonology clinic and set up an appointment.

## 2021-10-11 NOTE — ED Provider Notes (Signed)
[facility]-EMERGENCY DEPT Provider Note   CSN: [id number] Arrival date & time: 10/11/21  1191     History  Chief Complaint  Patient presents with   Shortness of Breath    Kaylee Hill is a 41 y.o. female.  41 yo F with a cc of shortness of breath.  This been going on for couple months now.  She tells me that she is very short of breath especially upon exertion.  Whenever she walks to class she feels like her breathing gets much worse and she has to stop and take breaks.  No significant chest discomfort.  She had been seen in urgent care and had an x-ray that was concerning for pneumonia and she has been on antibiotics.  She does not feel like that helped and actually she is got worse.  Was seen in urgent care about 3 days ago and they suggested she come to the ED but she declined.  Things got worse today and so she came to be evaluated.  She denies any significant cough or fever.  Denies any recent travel.  In December went to [place] in [place] by car.   Shortness of Breath     Home Medications Prior to Admission medications   Medication Sig Start Date End Date Taking? Authorizing Provider  predniSONE (DELTASONE) 20 MG tablet Take 2 tablets by mouth daily for  4 days 10/11/21  Yes Melene Plan, DO  amoxicillin-clavulanate (AUGMENTIN) 875-125 MG tablet Take 1 tablet by mouth every 12 (twelve) hours for 10 days. 10/03/21 10/13/21  Raspet, Noberto Retort, PA-C  benztropine (COGENTIN) 0.5 MG tablet Take 1 tablet (0.5 mg total) by mouth 2 (two) times daily. 06/08/21     benztropine (COGENTIN) 1 MG tablet Take 1 tablet (1 mg total) by mouth 2 (two) times daily. 06/18/21     benztropine (COGENTIN) 1 MG tablet Take 1 tablet (1 mg total) by mouth 3 (three) times daily. 06/25/21     Deutetrabenazine (AUSTEDO) 12 MG TABS Take 2 tablets by mouth daily. 07/04/21     Deutetrabenazine (AUSTEDO) 9 MG TABS Take 2 tablets (18 mg) by mouth 2 (two) times daily. 09/13/21     doxycycline  (VIBRAMYCIN) 100 MG capsule Take 1 capsule (100 mg total) by mouth 2 (two) times daily. 10/03/21   Raspet, Noberto Retort, PA-C  lithium 300 MG tablet Take 1 tablet (300 mg total) by mouth at bedtime for 4 days, THEN 2 tablets (600 mg total) at bedtime for 4 days, THEN 3 tablets (900 mg total) at bedtime. 07/31/21 10/14/21    LORazepam (ATIVAN) 0.5 MG tablet Take 1 tablet (0.5 mg total) by mouth 2 (two) times daily as needed for anxiety 06/08/21     LORazepam (ATIVAN) 0.5 MG tablet Take 1 tablet (0.5 mg total) by mouth 2 (two) times daily. 09/07/21     lurasidone (LATUDA) 40 MG TABS tablet Take 1 tablet (40 mg total) by mouth daily with a meal 06/08/21     Lurasidone HCl (LATUDA) 60 MG TABS take 1 tablet by mouth daily with a meal 12/04/20     metFORMIN (GLUCOPHAGE XR) 750 MG 24 hr tablet Take 1 tablet (750 mg total) by mouth daily with breakfast. 06/07/21   Etta Grandchild, MD  Multiple Vitamin (MULTIVITAMIN) tablet Take by mouth daily. Chew 2 per day    [provider]  nebivolol (BYSTOLIC) 5 MG tablet Take 1 tablet (5 mg total) by mouth daily. 02/07/21   Yetta Barre,  Bernadene Bell, MD  traZODone (DESYREL) 50 MG tablet take 1/2 - 2 tablet by mouth at night as needed for insomnia 12/04/20         Allergies    Patient has no known allergies.    Review of Systems   Review of Systems  Respiratory:  Positive for shortness of breath.    Physical Exam Updated Vital Signs BP (!) 161/108    Pulse 86    Temp 98.5 F (36.9 C) (Oral)    Resp 12    LMP 10/11/2021 (Approximate)    SpO2 92%  Physical Exam Vitals and nursing note reviewed.  Constitutional:      General: She is not in acute distress.    Appearance: She is well-developed. She is not diaphoretic.  HENT:     Head: Normocephalic and atraumatic.  Eyes:     Pupils: Pupils are equal, round, and reactive to light.  Cardiovascular:     Rate and Rhythm: Normal rate and regular rhythm.     Heart sounds: No murmur heard.   No friction rub. No gallop.  Pulmonary:      Effort: Pulmonary effort is normal.     Breath sounds: No wheezing or rales.  Abdominal:     General: There is no distension.     Palpations: Abdomen is soft.     Tenderness: There is no abdominal tenderness.  Musculoskeletal:        General: No tenderness.     Cervical back: Normal range of motion and neck supple.  Skin:    General: Skin is warm and dry.  Neurological:     Mental Status: She is alert and oriented to person, place, and time.  Psychiatric:        Behavior: Behavior normal.    ED Results / Procedures / Treatments   Labs (all labs ordered are listed, but only abnormal results are displayed) Labs Reviewed  CBC WITH DIFFERENTIAL/PLATELET - Abnormal; Notable for the following components:      Result Value   Hemoglobin 11.8 (*)    HCT 35.7 (*)    MCV 76.6 (*)    MCH 25.3 (*)    All other components within normal limits  COMPREHENSIVE METABOLIC PANEL - Abnormal; Notable for the following components:   Glucose, Bld 129 (*)    BUN 5 (*)    Calcium 8.7 (*)    All other components within normal limits  LITHIUM LEVEL - Abnormal; Notable for the following components:   Lithium Lvl 0.42 (*)    All other components within normal limits  RESP PANEL BY RT-PCR (FLU A&B, COVID) ARPGX2  HCG, QUANTITATIVE, PREGNANCY  TROPONIN I (HIGH SENSITIVITY)    EKG EKG Interpretation  Date/Time:  Thursday October 11 2021 09:29:18 EST Ventricular Rate:  77 PR Interval:  168 QRS Duration: 98 QT Interval:  334 QTC Calculation: 378 R Axis:   50 Text Interpretation: Sinus rhythm Borderline T wave abnormalities No significant change since last tracing Confirmed by Melene Plan [phone]) on 10/11/2021 9:43:05 AM  Radiology DG Chest 2 View  Result Date: 10/11/2021 CLINICAL DATA:  Shortness of breath for 1 month. Diagnosed with pneumonia 10/03/2021. EXAM: CHEST - 2 VIEW COMPARISON:  Chest two views 10/03/2021 FINDINGS: There is motion artifact on frontal view. Within this limitation, the  prior subtle heterogeneous and linear opacity within the mid to superior right lung is likely similar to prior. No gross abnormality is seen within the left lung. Cardiac silhouette and mediastinal  contours are within normal limits. Mild multilevel degenerative disc changes of the thoracic spine. IMPRESSION: Study is limited by motion artifact on frontal view. Within this limitation, no definite change in subtle heterogeneous opacity within the mid to upper right lung. There has not been progression, and there does not appear to be worsening of the previously described possible very mild pneumonia. Electronically Signed   By: Neita Garnet M.D.   On: 10/11/2021 10:31   CT Angio Chest PE W and/or Wo Contrast  Result Date: 10/11/2021 CLINICAL DATA:  Shortness of breath. EXAM: CT ANGIOGRAPHY CHEST WITH CONTRAST TECHNIQUE: Multidetector CT imaging of the chest was performed using the standard protocol during bolus administration of intravenous contrast. Multiplanar CT image reconstructions and MIPs were obtained to evaluate the vascular anatomy. RADIATION DOSE REDUCTION: This exam was performed according to the departmental dose-optimization program which includes automated exposure control, adjustment of the mA and/or kV according to patient size and/or use of iterative reconstruction technique. CONTRAST:  13mL OMNIPAQUE IOHEXOL 350 MG/ML SOLN COMPARISON:  Radiograph of same day. FINDINGS: Cardiovascular: Satisfactory opacification of the pulmonary arteries to the segmental level. No evidence of pulmonary embolism. Normal heart size. No pericardial effusion. Mediastinum/Nodes: No enlarged mediastinal, hilar, or axillary lymph nodes. Thyroid gland, trachea, and esophagus demonstrate no significant findings. Lungs/Pleura: No pneumothorax or pleural effusion is noted. Left lung is clear. Small ill-defined opacity is noted in right upper lobe most consistent with pneumonia. Upper Abdomen: No acute abnormality.  Musculoskeletal: No chest wall abnormality. No acute or significant osseous findings. Review of the MIP images confirms the above findings. IMPRESSION: No definite evidence of pulmonary embolus. Mild ill-defined opacity seen in right upper lobe consistent with pneumonia. Electronically Signed   By: Lupita Raider M.D.   On: 10/11/2021 11:46    Procedures Procedures    Medications Ordered in ED Medications  predniSONE (DELTASONE) tablet 60 mg (has no administration in time range)  albuterol (VENTOLIN HFA) 108 (90 Base) MCG/ACT inhaler 2 puff (has no administration in time range)  iohexol (OMNIPAQUE) 350 MG/ML injection 100 mL (80 mLs Intravenous Contrast Given 10/11/21 1129)    ED Course/ Medical Decision Making/ A&P                           Medical Decision Making Amount and/or Complexity of Data Reviewed Labs: ordered. Radiology: ordered.  Risk Prescription drug management.   Patient is a 41 y.o. female with a cc of shortness of breath.  This been going on for months now.  She has clear lung sounds for me.  She is taking very deep inspirations about every 10 seconds or so.  This does seem to be distractible especially during her lung exam.  We will obtain a chest x-ray blood work.  I reviewed the patient's medical record and she was seen in urgent care for this.  Had a right upper lobe infiltrate, no obvious risk factor for tuberculosis.  Low threshold for CT.  Repeat chest x-ray here.  Blood work.  Reassess.. Significant PMH of bipolar disorder on lithium.  We will check a lithium level..  I reviewed the patients chart and see the visit where she was seen in urgent care was started on both Augmentin and doxycycline..  I independently interpreted the patients labs and imaging plain film viewed by me and independently interpreted with the right upper lobe infiltrate not significantly changed from prior.  CT angiogram of the chest ordered to  assess for other possible pathology.  Has resulted  with no PE.  Is consistent with a right upper lobe infiltrate.  Blood work otherwise unremarkable no significant anemia no significant electrolyte abnormality troponin negative.  As the symptoms are persistent despite what seems reasonable antibiotic therapy will discuss with pulmonology.  Pulmonology independently reviewed her imaging felt like this could be resolving infiltrate based on the review of the CT scan.  Recommended short course of steroids and beta-2 agonist.  We will get follow-up for the pulmonology clinic.  12:32 PM:  I have discussed the diagnosis/risks/treatment options with the patient.  Evaluation and diagnostic testing in the emergency department does not suggest an emergent condition requiring admission or immediate intervention beyond what has been performed at this time.  They will follow up with  PCP, pulm. We also discussed returning to the ED immediately if new or worsening sx occur. We discussed the sx which are most concerning (e.g., sudden worsening sob, need to use inhaler more often than every 4 hours, fever, inability to tolerate by mouth) that necessitate immediate return. Medications administered to the patient during their visit and any new prescriptions provided to the patient are listed below.  Medications given during this visit Medications  predniSONE (DELTASONE) tablet 60 mg (has no administration in time range)  albuterol (VENTOLIN HFA) 108 (90 Base) MCG/ACT inhaler 2 puff (has no administration in time range)  iohexol (OMNIPAQUE) 350 MG/ML injection 100 mL (80 mLs Intravenous Contrast Given 10/11/21 1129)     The patient appears reasonably screen and/or stabilized for discharge and I doubt any other medical condition or other [facility] requiring further screening, evaluation, or treatment in the ED at this time prior to discharge.           Final Clinical Impression(s) / ED Diagnoses Final diagnoses:  Pneumonia of right upper lobe due to infectious organism     Rx / DC Orders ED Discharge Orders          Ordered    predniSONE (DELTASONE) 20 MG tablet        10/11/21 1230              Melene PlanFloyd, Amahia Madonia, DO 10/11/21 1232

## 2021-10-15 ENCOUNTER — Other Ambulatory Visit: Payer: Self-pay | Admitting: Internal Medicine

## 2021-10-15 ENCOUNTER — Other Ambulatory Visit (HOSPITAL_COMMUNITY): Payer: Self-pay

## 2021-10-15 DIAGNOSIS — I10 Essential (primary) hypertension: Secondary | ICD-10-CM

## 2021-10-16 ENCOUNTER — Other Ambulatory Visit (HOSPITAL_COMMUNITY): Payer: Self-pay

## 2021-10-19 ENCOUNTER — Other Ambulatory Visit (HOSPITAL_COMMUNITY): Payer: Self-pay

## 2021-10-19 MED ORDER — LITHIUM CARBONATE 300 MG PO CAPS
300.0000 mg | ORAL_CAPSULE | Freq: Every evening | ORAL | 3 refills | Status: DC
  Filled 2021-10-19: qty 90, 30d supply, fill #0
  Filled 2021-11-30: qty 90, 30d supply, fill #1
  Filled 2022-01-12: qty 90, 30d supply, fill #2
  Filled 2022-02-21: qty 90, 30d supply, fill #3

## 2021-10-19 MED ORDER — LORAZEPAM 0.5 MG PO TABS
0.5000 mg | ORAL_TABLET | Freq: Two times a day (BID) | ORAL | 3 refills | Status: DC
  Filled 2021-10-19 – 2021-11-16 (×2): qty 60, 30d supply, fill #0
  Filled 2021-12-17: qty 60, 30d supply, fill #1
  Filled 2022-01-17: qty 60, 30d supply, fill #2

## 2021-10-19 MED ORDER — AUSTEDO 12 MG PO TABS
2.0000 | ORAL_TABLET | Freq: Two times a day (BID) | ORAL | 3 refills | Status: DC
  Filled 2021-10-19: qty 120, 30d supply, fill #0
  Filled 2021-11-24: qty 120, 30d supply, fill #1
  Filled 2021-12-25: qty 120, 30d supply, fill #2
  Filled 2022-01-28: qty 120, 30d supply, fill #3

## 2021-10-22 ENCOUNTER — Other Ambulatory Visit (HOSPITAL_COMMUNITY): Payer: Self-pay

## 2021-10-23 ENCOUNTER — Ambulatory Visit (INDEPENDENT_AMBULATORY_CARE_PROVIDER_SITE_OTHER): Payer: No Typology Code available for payment source | Admitting: Neurology

## 2021-10-23 ENCOUNTER — Encounter: Payer: Self-pay | Admitting: Internal Medicine

## 2021-10-23 ENCOUNTER — Encounter: Payer: Self-pay | Admitting: Neurology

## 2021-10-23 ENCOUNTER — Other Ambulatory Visit: Payer: Self-pay

## 2021-10-23 VITALS — BP 139/97 | HR 87 | Ht 62.0 in | Wt 237.6 lb

## 2021-10-23 DIAGNOSIS — G479 Sleep disorder, unspecified: Secondary | ICD-10-CM | POA: Diagnosis not present

## 2021-10-23 DIAGNOSIS — G4733 Obstructive sleep apnea (adult) (pediatric): Secondary | ICD-10-CM | POA: Diagnosis not present

## 2021-10-23 DIAGNOSIS — H0259 Other disorders affecting eyelid function: Secondary | ICD-10-CM

## 2021-10-23 NOTE — Patient Instructions (Addendum)
Your neurological exam does not show any other involuntary movements.  I do think you should stay with the [place] for now since you just increased it, follow-up with Dr. Sheran Fava as scheduled.  We will do some blood work to look for any other treatable causes of involuntary movements.  Your brain MRI was normal last year which is reassuring as well.  I really do believe that your sleep apnea should be treated with your AutoPap machine, please start back on treatment, I have placed an order for updated supplies.  Once you start back on the AutoPap machine, please call our office to make a follow-up appointment in about 3 months in sleep clinic.  You can schedule with nurse practitioner.  Not sleeping well, sleep deprivation and underlying sleep apnea may tie in with worsening involuntary movements as well.  I think it is imperative that you get back on your AutoPap machine for that reason as well.  Untreated severe sleep apnea can cause long-term problems with heart disease, stroke risk, memory loss, and difficult to control hypertension, as well as increase your risk for irregular heartbeat called atrial fibrillation.  When you saw Dr. Barbaraann Barthel in March 2022, he also encouraged using your PAP machine at the time.

## 2021-10-23 NOTE — Progress Notes (Signed)
Order for PAP supplies sent to [facility].

## 2021-10-23 NOTE — Progress Notes (Signed)
Subjective:    Patient ID: Kaylee Hill is a 41 y.o. female.  HPI    Interim history:   Dear Dr. Sherrie Mustache,    I saw your patient, Kaylee Hill, upon your kind request in my neurologic clinic today for initial consultation of her Involuntary movements, concern for medication side effects.  The patient is unaccompanied today.  As you know, Kaylee Hill is a 41 year old right-handed woman with an underlying medical history of vitamin D deficiency, sleep apnea, headaches, sickle cell trait, hyperlipidemia, anxiety, depression, and severe obesity with a BMI of over 40, who reports a several month history of excessive eye blinking and eyelid heaviness to the point where it is difficult to open her eyes.  This has been going on since approximately September 2022.  She denies any pain with eye movements, she denies any involuntary movements elsewhere.  She has been off [place] since approximately late November 2022.  She has not been sleeping very well.  She does report going to bed earlier and waking up earlier.  She has not been using her AutoPap machine, she reports trying it once or twice within the past year when we last spoke, she does need new supplies.  I evaluated her for recurrent headaches and she was advised to restart her AutoPap therapy .  She had stopped using her AutoPap machine.  She has a history of severe obstructive sleep apnea based on a home sleep test from May 2020. She was also advised to proceed with evaluation through ophthalmology.  I referred her to ophthalmology at the time.  I reviewed your office records from 10/02/21.  She has been treated with Cogentin last year, she has been on [place] but this was discontinued recently.  She is currently on lithium.  She was tried on Austedo, which did not help her involuntary movements, Cogentin did not help either.  Her Lithium level on 10/11/2021 was below therapeutic at 0.42.  He was seen at [facility] emergency department on 10/11/2021 with  shortness of breath.  She presented to urgent care on 10/08/2021 for shortness of breath.  She had been treated earlier in February 2023 with Augmentin for community-acquired pneumonia.  She presented to the emergency room on 08/19/2021 with a complaint of face locking up.  She reported a several month history of excessive blinking.  She did not stay to get fully evaluated.  She had a brain MRI w/wo contrast on 10/25/20 and I reviewed the results: IMPRESSION:  Unremarkable MRI brain (with and without). No acute findings.   She saw Dr. Deloria Lair on 11/28/2020 and I reviewed the note, there was no evidence of retinopathy or optic nerve disease.  More recently, she has seen an optometrist, Dr. Stefan Church and reports that her eye exam was benign.  She has been using protective eye shades as she felt she was sensitive to light but she does not have any eye pain.  She has no difficulty moving her eyes.  She has not had any involuntary movements in her body.  She does not have a family history of involuntary movements.  She was restarted on Austedo and yesterday was advised to increase the dose, she currently takes 24 mg twice daily.  Previously:   10/12/20: 40 year old right-handed woman with an underlying medical history of hypertension, depression, anxiety, sickle cell trait, hyperlipidemia, iron deficiency, vitamin D deficiency, history of gestational diabetes, OSA, and morbid obesity with a BMI of over 40, who reports worsening headaches over the  past few months.  She reports a bifrontal headache which feels like a pressure-like sensation, also viselike.  She does not typically have any nausea or vomiting, has had intermittent blurry vision in the right eye, typically only after rubbing the right eye and the blurriness persists for a minute or so.  Does not happen on the left side.  No vision loss, no double vision.  No one-sided weakness or numbness or tingling or droopy face or slurring of speech.  She  has not been using her AutoPap for the past several months.  She reports that she moved and had the machine in storage and has not taken it out of storage.  Last usage of AutoPap therapy appears to be in June 2021 when we look on the website.   I have previously followed her for her obstructive sleep apnea.  Her home sleep test from May 2020 indicated severe obstructive sleep apnea and she was started on AutoPap therapy.  She was last seen for sleep apnea checkup on 10/11/2019, at which time she was compliant with her AutoPap.  She was working on weight loss.  I suggested an increase in her maximum AutoPap pressure to 15 cm at the time as her residual AHI was 4.2/h.     She had a recent cervical spine MRI without contrast on 07/25/2020 and I reviewed the results: IMPRESSION: Cervical spondylosis as outlined.   No significant spinal canal stenosis at any level.   At C6-C7, a disc bulge and uncovertebral hypertrophy contribute to mild/moderate right neural foraminal narrowing.   Mild neural foraminal narrowing on the left at C3-C4 and C4-C5.   Nonspecific reversal of the expected cervical lordosis.   Partially empty sella turcica. This finding is very commonly incidental, but can be associated with idiopathic intracranial hypertension.     The patient's allergies, current medications, family history, past medical history, past social history, past surgical history and problem list were reviewed and updated as appropriate.      Previously:   10/11/19: She reports overall doing well, sometimes she has more fatigue, feels like perhaps the settings need to be changed.  She uses a full facemask, it covers the nose, she may have tried in the very beginning a under the nose type of full facemask and did not really like that style.  She would be willing to seek a mask refit.  She would be willing to try a slightly higher maximum AutoPap pressure.  She has been on Saxenda, Metformin was therefore  reduced and she has restarted lisinopril per her PCP.   I saw her on 04/05/2019, at which time she had established treatment on AutoPap and was doing well.  She had noticed improvement in her sleep quality and daytime somnolence.    I reviewed her AutoPap compliance data from 09/08/2019 through 10/07/2019 which is a total of 30 days, during which time she used her machine every night with percent use days greater than 4 hours at 97%, indicating excellent compliance with an average usage of 7 hours and 8 minutes, residual AHI at goal at 4.2/h, leak on the high side with a 95th percentile at 27.8 L/min, 95th percentile pressure is 13.9 cm, range of 7 to 14 cm.     I first met her on 12/16/2018 and virtual visit at the request of her primary care PA, at which time she reported snoring, excessive daytime somnolence and witnessed apneas.  She was advised to proceed with a home sleep test.  She had this on 01/12/2019 which indicated severe sleep apnea with an AHI of 64.3/h, O2 nadir of 81%.  She was advised to proceed with treatment at home in the form of AutoPap therapy.   I reviewed her AutoPap compliance data from 03/01/2019 through 03/30/2019 which is a total of 30 days, during which time she used her AutoPap every night with percent used days greater than 4 hours at 100%, indicating superb compliance with an average usage of 6 hours and 30 minutes, residual AHI at goal at 2.5/h, 95th percentile of pressure at 13.9 cm, range of 7 cm to 14 cm, leak in the acceptable range with the 95th percentile at 16.5 L/min.     12/16/18: I am conducting a virtual, video based new patient visit via Webex in lieu of a face-to-face visit for evaluation of her sleep disorder, in particular, concern for underlying obstructive sleep apnea. The patient is unaccompanied today and joins via cell phone from home. She is referred by her primary care PA, Verline Lema, and I reviewed her note from 09/25/2018. Patient endorses snoring, witnessed  apneas and excessive daytime somnolence. Her Epworth sleepiness score is 20 out of 24, fatigue severity score is 56 out of 63.  Note, she is on potentially sedating medications including Abilify, lamotrigine and a beta blocker, labetalol.she has been on Abilify for the past 5+ years, on Lamictal only for the past few months. 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 Bedtime is around midnight and rise time around 10. Her husband and her mother-in-law have witnessed breathing pauses while she is asleep. She has had infrequent episodes of sleep paralysis, happens maybe once every 3 months, has noticed this for the past few years, maybe as long as 7 years. Her snoring wakes her up at times and sometimes she has woken up with a sense of gasping for air. She does not have recurrent morning headaches or night to night nocturia. She's not aware of any family history of OSA. She's not had a tonsillectomy. She has 4 dogs in the household, to our her mother-in-law's and 2 are affairs, they sleep on the floor in her bedroom. She lives with her husband, 12-year-old daughter, mother-in-law and sister-in-law. Her daughter sleeps in her own bed in their bedroom. She has a TV on at night for white noise. She does not drink caffeine on a daily basis secondary to feeling jittery from it. She does not utilize any alcohol currently and is a nonsmoker, she smoked one year between 1999 and 2000. Her mood is stable currently on her medication regimen, she is followed by mental health.  Her Past Medical History Is Significant For: Past Medical History:  Diagnosis Date   Anxiety    Bipolar 1 disorder ([place])    Depression    Gestational diabetes    Headache    Hypertension     Her Past Surgical History Is Significant For: Past Surgical History:  Procedure Laterality Date   BREAST SURGERY      Her Family History Is Significant For: Family History  Problem Relation Age of  Onset   Bipolar disorder Mother    Diabetes Father    Hypertension Father    Schizophrenia Father    Asthma Brother    Healthy Daughter    Diabetes Maternal Grandmother    Cancer Maternal Grandmother        breast, pancreatic cancer   Dementia Maternal Grandfather  Her Social History Is Significant For: Social History   Socioeconomic History   Marital status: Married    Spouse name: Not on file   Number of children: 1   Years of education: Not on file   Highest education level: Some college, no degree  Occupational History   Occupation: Marine scientist: [employer]  Tobacco Use   Smoking status: Former    Packs/day: 0.10    Years: 1.00    Pack years: 0.10    Types: Cigarettes    Quit date: 2000    Years since quitting: 23.1   Smokeless tobacco: Never  Vaping Use   Vaping Use: Never used  Substance and Sexual Activity   Alcohol use: No   Drug use: No   Sexual activity: Yes    Partners: Male    Birth control/protection: None  Other Topics Concern   Not on file  Social History Narrative   Not on file   Social Determinants of Health   Financial Resource Strain: Not on file  Food Insecurity: Not on file  Transportation Needs: Not on file  Physical Activity: Not on file  Stress: Not on file  Social Connections: Not on file    Her Allergies Are:  No Known Allergies:   Her Current Medications Are:  Outpatient Encounter Medications as of 10/23/2021  Medication Sig   Deutetrabenazine (AUSTEDO) 12 MG TABS Take 2 tablets by mouth daily.   Deutetrabenazine (AUSTEDO) 12 MG TABS Take 2 tablets by mouth 2 (two) times daily.   lithium carbonate 300 MG capsule Take 1 capsule (300 mg total) by mouth every evening for 4 days, then 2 capsules (648m) every evening for 4 days, then 3 capsules (9068m every evening   LORazepam (ATIVAN) 0.5 MG tablet Take 1 tablet (0.5 mg total) by mouth 2 (two) times daily as needed for anxiety   LORazepam (ATIVAN) 0.5 MG  tablet Take 1 tablet (0.5 mg total) by mouth 2 (two) times daily.   LORazepam (ATIVAN) 0.5 MG tablet Take 1 tablet (0.5 mg total) by mouth 2 (two) times daily.   metFORMIN (GLUCOPHAGE XR) 750 MG 24 hr tablet Take 1 tablet (750 mg total) by mouth daily with breakfast.   Multiple Vitamin (MULTIVITAMIN) tablet Take by mouth daily. Chew 2 per day   nebivolol (BYSTOLIC) 5 MG tablet Take 1 tablet (5 mg total) by mouth daily.   Probiotic Product (PROBIOTIC PO) Take by mouth daily.   traZODone (DESYREL) 50 MG tablet take 1/2 - 2 tablet by mouth at night as needed for insomnia   benztropine (COGENTIN) 0.5 MG tablet Take 1 tablet (0.5 mg total) by mouth 2 (two) times daily.   benztropine (COGENTIN) 1 MG tablet Take 1 tablet (1 mg total) by mouth 2 (two) times daily.   benztropine (COGENTIN) 1 MG tablet Take 1 tablet (1 mg total) by mouth 3 (three) times daily.   Deutetrabenazine (AUSTEDO) 9 MG TABS Take 2 tablets (18 mg) by mouth 2 (two) times daily.   doxycycline (VIBRAMYCIN) 100 MG capsule Take 1 capsule (100 mg total) by mouth 2 (two) times daily.   lithium 300 MG tablet Take 1 tablet (300 mg total) by mouth at bedtime for 4 days, THEN 2 tablets (600 mg total) at bedtime for 4 days, THEN 3 tablets (900 mg total) at bedtime.   lurasidone (LATUDA) 40 MG TABS tablet Take 1 tablet (40 mg total) by mouth daily with a meal   Lurasidone HCl (LATUDA)  60 MG TABS take 1 tablet by mouth daily with a meal   predniSONE (DELTASONE) 20 MG tablet Take 2 tablets by mouth daily for 4 days   No facility-administered encounter medications on file as of 10/23/2021.  :  Review of Systems:  Out of a complete 14 point review of systems, all are reviewed and negative with the exception of these symptoms as listed below:  Review of Systems  Neurological:        Pt is here for involuntary movements . Pt states she is having involuntary movement in eye lids close shut. Pt states he face has spams. Pt sounds SOB . Pt states she  had pneumonia and after that she had SOB.    Objective:  Neurological Exam  Physical Exam Physical Examination:   Vitals:   10/23/21 0854  BP: (!) 139/97  Pulse: 87    General Examination: The patient is a very pleasant 41 y.o. female in no acute distress. She appears well-developed and well-nourished and well groomed.   HEENT: Normocephalic, atraumatic, pupils are reactive to light.  Funduscopic exam is difficult, due to excessive eye closure and eye blinking, some distractibility noted.  No evidence of blepharospasm, particularly unilateral blepharospasm.  She does have good eye motility, she has dry eyes with some discharge noted.  She has dark eyeglasses which she removes for the examination.  She has no lingual or buccal dyskinesias.  She has no neck rigidity or involuntary neck movements.  She has no facial masking.  She has corrective eyeglasses.  Tongue movements are full, tongue protrudes centrally and palate elevates symmetrically, no motor impersistence with tongue protrusion.     Chest: Clear to auscultation without wheezing, rhonchi or crackles noted.  Intermittent noisy breathing.   Heart: S1+S2+0,  questionable systolic murmur noted.    Abdomen: Soft, non-tender and non-distended.   Extremities: There is no pitting edema in the distal lower extremities bilaterally.    Skin: Warm and dry without trophic changes noted.   Musculoskeletal: exam reveals no obvious joint deformities.    Neurologically:  Mental status: The patient is awake, alert and oriented in all 4 spheres. Her immediate and remote memory, attention, language skills and fund of knowledge are appropriate. There is no evidence of aphasia, agnosia, apraxia or anomia. Speech is clear with normal prosody and enunciation. Thought process is linear. Mood is normal and affect is normal.  Cranial nerves II - XII are as described above under HEENT exam. Motor exam: Normal bulk, strength and tone is noted. There is  no tremor.  No athetoid or choreiform movements, no myoclonus.  No resting tremor.  Romberg is negative. Fine motor skills and coordination: Finger taps and hand movements and rapid alternating patting, normal foot taps.    Cerebellar testing: No dysmetria or intention tremor. There is no truncal or gait ataxia.  Normal heel-to-shin and normal finger-to-nose. Sensory exam: intact to light touch in the upper and lower extremities bilaterally, symmetrically.  Gait, station and balance: She stands easily. No veering to one side is noted. No leaning to one side is noted. Posture is age-appropriate and stance is narrow based. Gait shows normal stride length and normal pace. No problems turning are noted. Tandem walk is not tested as she is uncomfortable because of eyes closing.    Assessment and Plan:  In summary, Rory Montel is a very pleasant 41 year old female with an underlying medical history of vitamin D deficiency, sleep apnea, headaches, sickle cell trait, hyperlipidemia,  anxiety, depression, and severe obesity with a BMI of over 40, wh presents for evaluation of involuntary excessive eye blinking for the past 4 to 5 months.  She feels that her symptoms have not improved with medication such as Cogentin or Austedo.  You could certainly try Ingrezza for concern for tardive dyskinesia.  She has been off the [place] for the past 4 months approximately.  She is advised that involuntary movements can get worse with underlying sleep deprivation and poor sleep consolidation, she does have a history of severe obstructive sleep apnea and has had a history of recurrent headaches.  She is again advised strongly to get back on her AutoPap machine.  She is advised to call our office to make a follow-up appointment in the sleep clinic about 3 months after she starts using her machine again.  She is willing to get back on it.  She was also advised to get back on her Pap therapy when she saw Dr. Deloria Lair, retina  specialist in March 2022.  She had a brain MRI in 2022 which was benign, examination does not show any widespread dyskinesias or choreiform movements, she does not have any history of hereditary movement disorders.  She does have some distractibility on her eye blinking today.  She does not have any oral or buccal or lingual dyskinesias.  Findings are not in keeping with blepharospasm.  She is advised to follow-up with you and continue with the Austedo for now.  I would like to go ahead and do some additional blood work to look for any treatable causes for involuntary movements such as lupus or thyroid disease.  We will also do vitamin D and vitamin B12 check and heavy metal check.  We will notify her of the blood test results.  She is advised to call our office to make a follow-up appointment in sleep clinic once she is back on the AutoPap.  I placed a prescription for renewal of her PAP supplies.  Thankfully, she has an eye examination which per her report was benign and she had also seen a specialist last year.  She was again advised as to the possible long-term repercussions of leaving severe obstructive sleep apnea untreated.  I answered all her questions today and she was in agreement with the plan. Thank you very much for allowing me to participate in the care of this nice patient. If I can be of any further assistance to you please do not hesitate to call me at [phone].  Sincerely,   Star Age, MD, PhD  I spent 40 minutes in total face-to-face time and in reviewing records with significant record review involved during pre-charting, more than 50% of which was spent in counseling and coordination of care, reviewing test results, reviewing medications and treatment regimen and/or in discussing or reviewing the diagnosis of excessive eye blinking, OSA, the prognosis and treatment options. Pertinent laboratory and imaging test results that were available during this visit with the patient were  reviewed by me and considered in my medical decision making (see chart for details).

## 2021-10-24 ENCOUNTER — Encounter: Payer: Self-pay | Admitting: Internal Medicine

## 2021-10-24 ENCOUNTER — Encounter: Payer: Self-pay | Admitting: Neurology

## 2021-10-29 ENCOUNTER — Ambulatory Visit (INDEPENDENT_AMBULATORY_CARE_PROVIDER_SITE_OTHER): Payer: No Typology Code available for payment source | Admitting: Internal Medicine

## 2021-10-29 ENCOUNTER — Other Ambulatory Visit: Payer: Self-pay

## 2021-10-29 ENCOUNTER — Encounter: Payer: Self-pay | Admitting: Internal Medicine

## 2021-10-29 ENCOUNTER — Telehealth: Payer: Self-pay | Admitting: Neurology

## 2021-10-29 VITALS — BP 126/84 | HR 83 | Temp 98.2°F | Ht 62.0 in | Wt 240.0 lb

## 2021-10-29 DIAGNOSIS — E785 Hyperlipidemia, unspecified: Secondary | ICD-10-CM

## 2021-10-29 DIAGNOSIS — F319 Bipolar disorder, unspecified: Secondary | ICD-10-CM

## 2021-10-29 DIAGNOSIS — H0259 Other disorders affecting eyelid function: Secondary | ICD-10-CM

## 2021-10-29 DIAGNOSIS — G5139 Clonic hemifacial spasm, unspecified: Secondary | ICD-10-CM

## 2021-10-29 DIAGNOSIS — D509 Iron deficiency anemia, unspecified: Secondary | ICD-10-CM

## 2021-10-29 DIAGNOSIS — I10 Essential (primary) hypertension: Secondary | ICD-10-CM

## 2021-10-29 DIAGNOSIS — E1169 Type 2 diabetes mellitus with other specified complication: Secondary | ICD-10-CM

## 2021-10-29 DIAGNOSIS — D539 Nutritional anemia, unspecified: Secondary | ICD-10-CM

## 2021-10-29 DIAGNOSIS — Z1159 Encounter for screening for other viral diseases: Secondary | ICD-10-CM | POA: Insufficient documentation

## 2021-10-29 DIAGNOSIS — E118 Type 2 diabetes mellitus with unspecified complications: Secondary | ICD-10-CM | POA: Diagnosis not present

## 2021-10-29 DIAGNOSIS — E538 Deficiency of other specified B group vitamins: Secondary | ICD-10-CM

## 2021-10-29 LAB — BASIC METABOLIC PANEL
BUN: 7 mg/dL (ref 6–23)
CO2: 30 mEq/L (ref 19–32)
Calcium: 9.2 mg/dL (ref 8.4–10.5)
Chloride: 101 mEq/L (ref 96–112)
Creatinine, Ser: 0.6 mg/dL (ref 0.40–1.20)
GFR: 110.49 mL/min (ref >60.00)
Glucose, Bld: 150 mg/dL — ABNORMAL HIGH (ref 70–99)
Potassium: 4.2 mEq/L (ref 3.5–5.1)
Sodium: 137 mEq/L (ref 135–145)

## 2021-10-29 LAB — CBC WITH DIFFERENTIAL/PLATELET
Basophils Absolute: 0 10*3/uL (ref 0.0–0.1)
Basophils Relative: 0.5 % (ref 0.0–3.0)
Eosinophils Absolute: 0.2 10*3/uL (ref 0.0–0.7)
Eosinophils Relative: 2.3 % (ref 0.0–5.0)
HCT: 34.9 % — ABNORMAL LOW (ref 36.0–46.0)
Hemoglobin: 11.6 g/dL — ABNORMAL LOW (ref 12.0–15.0)
Lymphocytes Relative: 26.6 % (ref 12.0–46.0)
Lymphs Abs: 2 10*3/uL (ref 0.7–4.0)
MCHC: 33.2 g/dL (ref 30.0–36.0)
MCV: 76.6 fl — ABNORMAL LOW (ref 78.0–100.0)
Monocytes Absolute: 0.7 10*3/uL (ref 0.1–1.0)
Monocytes Relative: 8.7 % (ref 3.0–12.0)
Neutro Abs: 4.7 10*3/uL (ref 1.4–7.7)
Neutrophils Relative %: 61.9 % (ref 43.0–77.0)
Platelets: 233 10*3/uL (ref 150.0–400.0)
RBC: 4.55 Mil/uL (ref 3.87–5.11)
RDW: 14.6 % (ref 11.5–15.5)
WBC: 7.6 10*3/uL (ref 4.0–10.5)

## 2021-10-29 LAB — HEMOGLOBIN A1C: Hgb A1c MFr Bld: 7.1 % — ABNORMAL HIGH (ref 4.6–6.5)

## 2021-10-29 NOTE — Patient Instructions (Signed)
Anemia °Anemia is a condition in which there is not enough red blood cells or hemoglobin in the blood. Hemoglobin is a substance in red blood cells that carries oxygen. °When you do not have enough red blood cells or hemoglobin (are anemic), your body cannot get enough oxygen and your organs may not work properly. As a result, you may feel very tired or have other problems. °What are the causes? °Common causes of anemia include: °Excessive bleeding. Anemia can be caused by excessive bleeding inside or outside the body, including bleeding from the intestines or from heavy menstrual periods in females. °Poor nutrition. °Long-lasting (chronic) kidney, thyroid, and liver disease. °Bone marrow disorders, spleen problems, and blood disorders. °Cancer and treatments for cancer. °HIV (human immunodeficiency virus) and AIDS (acquired immunodeficiency syndrome). °Infections, medicines, and autoimmune disorders that destroy red blood cells. °What are the signs or symptoms? °Symptoms of this condition include: °Minor weakness. °Dizziness. °Headache, or difficulties concentrating and sleeping. °Heartbeats that feel irregular or faster than normal (palpitations). °Shortness of breath, especially with exercise. °Pale skin, lips, and nails, or cold hands and feet. °Indigestion and nausea. °Symptoms may occur suddenly or develop slowly. If your anemia is mild, you may not have symptoms. °How is this diagnosed? °This condition is diagnosed based on blood tests, your medical history, and a physical exam. In some cases, a test may be needed in which cells are removed from the soft tissue inside of a bone and looked at under a microscope (bone marrow biopsy). Your health care provider may also check your stool (feces) for blood and may do additional testing to look for the cause of your bleeding. °Other tests may include: °Imaging tests, such as a CT scan or MRI. °A procedure to see inside your esophagus and stomach (endoscopy). °A  procedure to see inside your colon and rectum (colonoscopy). °How is this treated? °Treatment for this condition depends on the cause. If you continue to lose a lot of blood, you may need to be treated at a hospital. Treatment may include: °Taking supplements of iron, vitamin B12, or folic acid. °Taking a hormone medicine (erythropoietin) that can help to stimulate red blood cell growth. °Having a blood transfusion. This may be needed if you lose a lot of blood. °Making changes to your diet. °Having surgery to remove your spleen. °Follow these instructions at home: °Take over-the-counter and prescription medicines only as told by your health care provider. °Take supplements only as told by your health care provider. °Follow any diet instructions that you were given by your health care provider. °Keep all follow-up visits as told by your health care provider. This is important. °Contact a health care provider if: °You develop new bleeding anywhere in the body. °Get help right away if: °You are very weak. °You are short of breath. °You have pain in your abdomen or chest. °You are dizzy or feel faint. °You have trouble concentrating. °You have bloody stools, black stools, or tarry stools. °You vomit repeatedly or you vomit up blood. °These symptoms may represent a serious problem that is an emergency. Do not wait to see if the symptoms will go away. Get medical help right away. Call your local emergency services (911 in the [place] Do not drive yourself to the hospital. °Summary °Anemia is a condition in which you do not have enough red blood cells or enough of a substance in your red blood cells that carries oxygen (hemoglobin). °Symptoms may occur suddenly or develop slowly. °If your anemia is   mild, you may not have symptoms. °This condition is diagnosed with blood tests, a medical history, and a physical exam. Other tests may be needed. °Treatment for this condition depends on the cause of the anemia. °This  information is not intended to replace advice given to you by your health care provider. Make sure you discuss any questions you have with your health care provider. °Document Revised: 07/27/2019 Document Reviewed: 07/27/2019 °Elsevier Patient Education © 2022 [employer] ° °

## 2021-10-29 NOTE — Telephone Encounter (Signed)
I called pt and relayed her results of her labs. The only recommendation was to increase her Vit D  to 2000u daily.  She takes MVI and will check the amount there and increase to make sure she is taking 2000units daily.  Her A1C is 6.8, and her glucose was 145.  All other normal.  Her copper is pending.  She is frustrated that no answer to her involuntary movements.  I told her will see if any other recommendations from Dr. Frances Furbish.  Check with [facility] about her labs in Epic.

## 2021-10-29 NOTE — Telephone Encounter (Signed)
At 1:22 pt left vm re: blood work drawn last week, pt would like a call to discuss results when they are available.

## 2021-10-29 NOTE — Progress Notes (Signed)
Subjective:  Patient ID: Kaylee Hill, female    DOB: 1980-11-30  Age: 41 y.o. MRN: [id number]  CC: Diabetes and Anemia  This visit occurred during the SARS-CoV-2 public health emergency.  Safety protocols were in place, including screening questions prior to the visit, additional usage of staff PPE, and extensive cleaning of exam room while observing appropriate contact time as indicated for disinfecting solutions.    HPI GENOVA KINER presents for f/up -  She does not feel well enough to work and asks for a note to help her with STD. She is concerned that the eye blinking will cause her to make a mistake.   Outpatient Medications Prior to Visit  Medication Sig Dispense Refill   benztropine (COGENTIN) 1 MG tablet Take 1 tablet (1 mg total) by mouth 3 (three) times daily. 90 tablet 3   Deutetrabenazine (AUSTEDO) 12 MG TABS Take 2 tablets by mouth 2 (two) times daily. 120 tablet 3   lithium carbonate 300 MG capsule Take 1 capsule (300 mg total) by mouth every evening for 4 days, then 2 capsules (600mg ) every evening for 4 days, then 3 capsules (900mg ) every evening 90 capsule 3   LORazepam (ATIVAN) 0.5 MG tablet Take 1 tablet (0.5 mg total) by mouth 2 (two) times daily. 60 tablet 3   Lurasidone HCl (LATUDA) 60 MG TABS take 1 tablet by mouth daily with a meal 30 tablet 3   Multiple Vitamin (MULTIVITAMIN) tablet Take by mouth daily. Chew 2 per day     nebivolol (BYSTOLIC) 5 MG tablet Take 1 tablet (5 mg total) by mouth daily. 90 tablet 1   Probiotic Product (PROBIOTIC PO) Take by mouth daily.     traZODone (DESYREL) 50 MG tablet take 1/2 - 2 tablet by mouth at night as needed for insomnia 180 tablet 1   benztropine (COGENTIN) 0.5 MG tablet Take 1 tablet (0.5 mg total) by mouth 2 (two) times daily. 60 tablet 3   benztropine (COGENTIN) 1 MG tablet Take 1 tablet (1 mg total) by mouth 2 (two) times daily. 60 tablet 3   Deutetrabenazine (AUSTEDO) 12 MG TABS Take 2 tablets by mouth daily. 60  tablet 3   Deutetrabenazine (AUSTEDO) 9 MG TABS Take 2 tablets (18 mg) by mouth 2 (two) times daily. 120 tablet 3   doxycycline (VIBRAMYCIN) 100 MG capsule Take 1 capsule (100 mg total) by mouth 2 (two) times daily. 20 capsule 0   LORazepam (ATIVAN) 0.5 MG tablet Take 1 tablet (0.5 mg total) by mouth 2 (two) times daily as needed for anxiety 60 tablet 0   LORazepam (ATIVAN) 0.5 MG tablet Take 1 tablet (0.5 mg total) by mouth 2 (two) times daily. 60 tablet 3   lurasidone (LATUDA) 40 MG TABS tablet Take 1 tablet (40 mg total) by mouth daily with a meal 90 tablet 1   metFORMIN (GLUCOPHAGE XR) 750 MG 24 hr tablet Take 1 tablet (750 mg total) by mouth daily with breakfast. 90 tablet 1   predniSONE (DELTASONE) 20 MG tablet Take 2 tablets by mouth daily for 4 days 8 tablet 0   lithium 300 MG tablet Take 1 tablet (300 mg total) by mouth at bedtime for 4 days, THEN 2 tablets (600 mg total) at bedtime for 4 days, THEN 3 tablets (900 mg total) at bedtime. 90 tablet 3   No facility-administered medications prior to visit.    ROS Review of Systems  Constitutional:  Positive for unexpected weight change (wt gain).  Negative for fatigue.  HENT: Negative.    Eyes: Negative.   Respiratory:  Negative for cough, chest tightness, shortness of breath and wheezing.   Cardiovascular:  Negative for chest pain, palpitations and leg swelling.  Gastrointestinal:  Negative for abdominal pain, constipation, diarrhea, nausea and vomiting.  Genitourinary: Negative.  Negative for difficulty urinating.  Musculoskeletal: Negative.   Skin: Negative.   Neurological: Negative.  Negative for dizziness.  Hematological:  Negative for adenopathy. Does not bruise/bleed easily.  Psychiatric/Behavioral:  Positive for decreased concentration and dysphoric mood. Negative for behavioral problems, confusion, self-injury, sleep disturbance and suicidal ideas. The patient is nervous/anxious.    Objective:  BP 126/84 (BP Location: Right  Arm, Patient Position: Sitting, Cuff Size: Large)    Pulse 83    Temp 98.2 F (36.8 C) (Oral)    Ht 5\' 2"  (1.575 m)    Wt 240 lb (108.9 kg)    LMP 10/11/2021 (Approximate)    SpO2 98%    BMI 43.90 kg/m   BP Readings from Last 3 Encounters:  10/29/21 126/84  10/23/21 (!) 139/97  10/11/21 (!) 145/100    Wt Readings from Last 3 Encounters:  10/29/21 240 lb (108.9 kg)  10/23/21 237 lb 9.6 oz (107.8 kg)  06/06/21 226 lb (102.5 kg)    Physical Exam Vitals reviewed.  Constitutional:      Appearance: She is ill-appearing (intermittent forced gasping).  HENT:     Nose: Nose normal.     Mouth/Throat:     Mouth: Mucous membranes are moist.  Eyes:     General: No scleral icterus.    Conjunctiva/sclera: Conjunctivae normal.  Cardiovascular:     Rate and Rhythm: Normal rate and regular rhythm.     Heart sounds: No murmur heard. Pulmonary:     Effort: Pulmonary effort is normal. No tachypnea or respiratory distress.     Breath sounds: Examination of the right-upper field reveals decreased breath sounds. Examination of the left-upper field reveals decreased breath sounds. Examination of the right-middle field reveals decreased breath sounds. Examination of the left-middle field reveals decreased breath sounds. Examination of the right-lower field reveals decreased breath sounds. Examination of the left-lower field reveals decreased breath sounds. Decreased breath sounds present. No wheezing, rhonchi or rales.  Musculoskeletal:        General: Normal range of motion.     Cervical back: Neck supple.     Right lower leg: No edema.     Left lower leg: No edema.  Lymphadenopathy:     Cervical: No cervical adenopathy.  Skin:    General: Skin is warm and dry.  Neurological:     Mental Status: She is alert. Mental status is at baseline.     Comments: Eye muscle/facial spasms, psychomotor hyperactivity  Psychiatric:        Attention and Perception: Perception normal. She is inattentive.         Mood and Affect: Mood is anxious and depressed. Affect is not blunt or tearful.        Speech: Speech is delayed and tangential. Speech is not rapid and pressured or slurred.        Behavior: Behavior normal. Behavior is not agitated, slowed, aggressive, withdrawn or hyperactive.        Thought Content: Thought content normal. Thought content is not paranoid or delusional. Thought content does not include homicidal or suicidal ideation.        Cognition and Memory: Cognition normal.  Judgment: Judgment normal.    Lab Results  Component Value Date   WBC 7.6 10/29/2021   HGB 11.6 (L) 10/29/2021   HCT 34.9 (L) 10/29/2021   PLT 233.0 10/29/2021   GLUCOSE 150 (H) 10/29/2021   CHOL 178 04/17/2020   TRIG 158 (H) 04/17/2020   HDL 30 (L) 04/17/2020   LDLCALC 121 (H) 04/17/2020   ALT 14 10/23/2021   AST 22 10/23/2021   NA 137 10/29/2021   K 4.2 10/29/2021   CL 101 10/29/2021   CREATININE 0.60 10/29/2021   BUN 7 10/29/2021   CO2 30 10/29/2021   TSH 1.890 10/23/2021   HGBA1C 7.1 (H) 10/29/2021   MICROALBUR <0.7 06/06/2021    DG Chest 2 View  Result Date: 10/11/2021 CLINICAL DATA:  Shortness of breath for 1 month. Diagnosed with pneumonia 10/03/2021. EXAM: CHEST - 2 VIEW COMPARISON:  Chest two views 10/03/2021 FINDINGS: There is motion artifact on frontal view. Within this limitation, the prior subtle heterogeneous and linear opacity within the mid to superior right lung is likely similar to prior. No gross abnormality is seen within the left lung. Cardiac silhouette and mediastinal contours are within normal limits. Mild multilevel degenerative disc changes of the thoracic spine. IMPRESSION: Study is limited by motion artifact on frontal view. Within this limitation, no definite change in subtle heterogeneous opacity within the mid to upper right lung. There has not been progression, and there does not appear to be worsening of the previously described possible very mild pneumonia.  Electronically Signed   By: Neita Garnet M.D.   On: 10/11/2021 10:31   CT Angio Chest PE W and/or Wo Contrast  Result Date: 10/11/2021 CLINICAL DATA:  Shortness of breath. EXAM: CT ANGIOGRAPHY CHEST WITH CONTRAST TECHNIQUE: Multidetector CT imaging of the chest was performed using the standard protocol during bolus administration of intravenous contrast. Multiplanar CT image reconstructions and MIPs were obtained to evaluate the vascular anatomy. RADIATION DOSE REDUCTION: This exam was performed according to the departmental dose-optimization program which includes automated exposure control, adjustment of the mA and/or kV according to patient size and/or use of iterative reconstruction technique. CONTRAST:  51mL OMNIPAQUE IOHEXOL 350 MG/ML SOLN COMPARISON:  Radiograph of same day. FINDINGS: Cardiovascular: Satisfactory opacification of the pulmonary arteries to the segmental level. No evidence of pulmonary embolism. Normal heart size. No pericardial effusion. Mediastinum/Nodes: No enlarged mediastinal, hilar, or axillary lymph nodes. Thyroid gland, trachea, and esophagus demonstrate no significant findings. Lungs/Pleura: No pneumothorax or pleural effusion is noted. Left lung is clear. Small ill-defined opacity is noted in right upper lobe most consistent with pneumonia. Upper Abdomen: No acute abnormality. Musculoskeletal: No chest wall abnormality. No acute or significant osseous findings. Review of the MIP images confirms the above findings. IMPRESSION: No definite evidence of pulmonary embolus. Mild ill-defined opacity seen in right upper lobe consistent with pneumonia. Electronically Signed   By: Lupita Raider M.D.   On: 10/11/2021 11:46    Assessment & Plan:   Cella was seen today for diabetes and anemia.  Diagnoses and all orders for this visit:  Benign essential hypertension- Her BP is adequately well controlled. -     CBC with Differential/Platelet; Future -     Basic metabolic panel;  Future -     Basic metabolic panel -     CBC with Differential/Platelet  Type II diabetes mellitus with manifestations (HCC)- Her A1C is up to 7.1% - Will increase the metformin dose. -     Hemoglobin A1c;  Future -     Basic metabolic panel; Future -     Fructosamine; Future -     Fructosamine -     Basic metabolic panel -     Hemoglobin A1c -     metFORMIN (GLUCOPHAGE-XR) 750 MG 24 hr tablet; Take 2 tablets (1,500 mg total) by mouth daily with breakfast.  Iron deficiency anemia, unspecified iron deficiency anemia type -     CBC with Differential/Platelet; Future -     IBC + Ferritin; Future -     IBC + Ferritin -     CBC with Differential/Platelet  B12 deficiency -     CBC with Differential/Platelet; Future -     Vitamin B12; Future -     Folate; Future -     Folate -     Vitamin B12 -     CBC with Differential/Platelet  Need for hepatitis C screening test -     Hepatitis C antibody; Future -     Hepatitis C antibody  Hyperlipidemia associated with type 2 diabetes mellitus (HCC)- She is at risk for pregnancy. Will avoid statins.  Deficiency anemia -     Zinc; Future -     Vitamin B1; Future  Bipolar disorder with psychotic features (HCC) -     AMB Referral to [facility]  Excessive involuntary blinking -     Cancel: Ambulatory referral to Neurology  Facial spasm -     Ambulatory referral to Neurology   I have discontinued Annitta Jersey D. Milliron's lithium, doxycycline, and predniSONE. I have also changed her metFORMIN. Additionally, I am having her maintain her multivitamin, Latuda, traZODone, nebivolol, benztropine, Austedo, lithium carbonate, LORazepam, and Probiotic Product (PROBIOTIC PO).  Meds ordered this encounter  Medications   metFORMIN (GLUCOPHAGE-XR) 750 MG 24 hr tablet    Sig: Take 2 tablets (1,500 mg total) by mouth daily with breakfast.    Dispense:  180 tablet    Refill:  1     Follow-up: Return in about 3 months (around  01/26/2022).  Sanda Linger, MD

## 2021-10-29 NOTE — Telephone Encounter (Signed)
Please call patient regarding her lab work: Heavy metal profile was normal, chemistry panel showed an elevated glucose of 145, A1c which is a marker for diabetes control was 6.8, vitamin B12 and folate normal, TSH which is a thyroid screening test was normal, vitamin B6 normal, rheumatoid factor negative, vitamin D low at 18.6, vitamin B1 normal, autoimmune marker called ANA normal, ammonia level normal, CRP which is a inflammatory marker mildly abnormal, which is nonspecific.   I would recommend that she start an over-the-counter vitamin D supplement and follow-up with her primary care for a recheck on her vitamin D level in about 3 months, she can start taking about 2000 units/day, she can also check with her primary care about dose recommendation from his standpoint.

## 2021-10-29 NOTE — Telephone Encounter (Signed)
I called [facility]. The results from 10/23/21 are not in Epic.   The results are ready except for the "Copper, free" which is still pending.  They have submitted a ticket to get this issue fixed.  Meanwhile, I have obtained a copy of the lab results from our phlebotomist and provided these to Dr. Rexene Alberts for review.  Please send further communication regarding this matter to POD 4.

## 2021-10-30 DIAGNOSIS — D539 Nutritional anemia, unspecified: Secondary | ICD-10-CM | POA: Insufficient documentation

## 2021-10-30 LAB — IBC + FERRITIN
Ferritin: 29.5 ng/mL (ref 10.0–291.0)
Iron: 55 ug/dL (ref 42–145)
Saturation Ratios: 13.1 % — ABNORMAL LOW (ref 20.0–50.0)
TIBC: 418.6 ug/dL (ref 250.0–450.0)
Transferrin: 299 mg/dL (ref 212.0–360.0)

## 2021-10-30 LAB — VITAMIN B12: Vitamin B-12: 675 pg/mL (ref 211–911)

## 2021-10-30 LAB — FOLATE: Folate: >24.2 ng/mL (ref >5.9)

## 2021-10-30 NOTE — Telephone Encounter (Signed)
Spoke to patient Kaylee Hill recommendation and pt states she will follow up with her PCP . Patient thanked me for calling

## 2021-10-30 NOTE — Telephone Encounter (Signed)
I recommend she FU with her psychiatrist for her involuntary movements.  If she would like to pursue a second opinion for her involuntary movements, she can ask her primary care or psychiatrist for referral to another neurologist.  Unfortunately, other than that I do not have any additional recommendations.

## 2021-10-30 NOTE — Telephone Encounter (Signed)
Forms have been completed and given to PCP for review and signature.  

## 2021-10-30 NOTE — Telephone Encounter (Signed)
I spoke to [place] in [place] re: labs placing in Epic, she will have to call CS and see what she can do.  Copper level still pending awaiting determination due to send out lab.

## 2021-10-31 ENCOUNTER — Other Ambulatory Visit (HOSPITAL_COMMUNITY): Payer: Self-pay

## 2021-10-31 DIAGNOSIS — Z0279 Encounter for issue of other medical certificate: Secondary | ICD-10-CM

## 2021-10-31 LAB — VITAMIN D 25 HYDROXY (VIT D DEFICIENCY, FRACTURES): Vit D, 25-Hydroxy: 18.6 ng/mL — ABNORMAL LOW (ref 30.0–100.0)

## 2021-10-31 LAB — COMPREHENSIVE METABOLIC PANEL
ALT: 14 IU/L (ref 0–32)
AST: 22 IU/L (ref 0–40)
Albumin/Globulin Ratio: 1.1 — ABNORMAL LOW (ref 1.2–2.2)
Albumin: 3.9 g/dL (ref 3.8–4.8)
Alkaline Phosphatase: 90 IU/L (ref 44–121)
BUN/Creatinine Ratio: 6 — ABNORMAL LOW (ref 9–23)
BUN: 4 mg/dL — ABNORMAL LOW (ref 6–24)
Bilirubin Total: 0.3 mg/dL (ref 0.0–1.2)
CO2: 26 mmol/L (ref 20–29)
Calcium: 9.7 mg/dL (ref 8.7–10.2)
Chloride: 99 mmol/L (ref 96–106)
Creatinine, Ser: 0.65 mg/dL (ref 0.57–1.00)
Globulin, Total: 3.6 g/dL (ref 1.5–4.5)
Glucose: 145 mg/dL — ABNORMAL HIGH (ref 70–99)
Potassium: 4.2 mmol/L (ref 3.5–5.2)
Sodium: 140 mmol/L (ref 134–144)
Total Protein: 7.5 g/dL (ref 6.0–8.5)
eGFR: 113 mL/min/{1.73_m2} (ref >59)

## 2021-10-31 LAB — VITAMIN B6: Vitamin B6: 18.3 ug/L (ref 3.4–65.2)

## 2021-10-31 LAB — HGB A1C W/O EAG: Hgb A1c MFr Bld: 6.8 % — ABNORMAL HIGH (ref 4.8–5.6)

## 2021-10-31 LAB — FRUCTOSAMINE: Fructosamine: 311 umol/L — ABNORMAL HIGH (ref 205–285)

## 2021-10-31 LAB — C-REACTIVE PROTEIN: CRP: 13 mg/L — ABNORMAL HIGH (ref 0–10)

## 2021-10-31 LAB — ANA W/REFLEX: Anti Nuclear Antibody (ANA): NEGATIVE

## 2021-10-31 LAB — HEPATITIS C ANTIBODY
Hepatitis C Ab: NONREACTIVE
SIGNAL TO CUT-OFF: 0.05 (ref <1.00)

## 2021-10-31 LAB — RPR: RPR Ser Ql: NONREACTIVE

## 2021-10-31 LAB — HEAVY METALS PROFILE II, BLOOD
Arsenic: 5 ug/L (ref 0–9)
Cadmium: <0.5 ug/L (ref 0.0–1.2)
Lead, Blood: <1 ug/dL (ref 0.0–3.4)
Mercury: <1 ug/L (ref 0.0–14.9)

## 2021-10-31 LAB — RHEUMATOID FACTOR: Rheumatoid fact SerPl-aCnc: <10 [IU]/mL (ref <14.0)

## 2021-10-31 LAB — AMMONIA: Ammonia: 51 ug/dL (ref 31–155)

## 2021-10-31 LAB — VITAMIN B1: Thiamine: 87 nmol/L (ref 66.5–200.0)

## 2021-10-31 LAB — B12 AND FOLATE PANEL
Folate: >20 ng/mL (ref >3.0)
Vitamin B-12: 791 pg/mL (ref 232–1245)

## 2021-10-31 LAB — CERULOPLASMIN: Ceruloplasmin: 26.5 mg/dL (ref 19.0–39.0)

## 2021-10-31 LAB — COPPER, FREE: Copper - Free: 460 mcg/L

## 2021-10-31 LAB — TSH: TSH: 1.89 u[IU]/mL (ref 0.450–4.500)

## 2021-10-31 MED ORDER — METFORMIN HCL ER 750 MG PO TB24
1500.0000 mg | ORAL_TABLET | Freq: Every day | ORAL | 1 refills | Status: DC
Start: 2021-10-31 — End: 2022-04-15
  Filled 2021-10-31 – 2021-12-05 (×2): qty 180, 90d supply, fill #0

## 2021-10-31 NOTE — Telephone Encounter (Signed)
Forms have been signed ? ?Copy for pt ?Copy sent to charge ?Original filed with CMA ?

## 2021-11-02 ENCOUNTER — Telehealth: Payer: Self-pay | Admitting: *Deleted

## 2021-11-02 ENCOUNTER — Encounter: Payer: Self-pay | Admitting: Internal Medicine

## 2021-11-02 NOTE — Chronic Care Management (AMB) (Signed)
?  Care Management  ? ?Note ? ?11/02/2021 ?Name: MARQUISHA NIKOLOV MRN: [id number] DOB: 1981/05/14 ? ?JAKIA KENNEBREW is a 41 y.o. year old female who is a primary care patient of Etta Grandchild, MD. I reached out to [name] by phone today in response to a referral sent by Ms. Anner Crete Pulis's primary care provider.  ? ?Ms. Tristan was given information about care management services today including:  ?Care management services include personalized support from designated clinical staff supervised by her physician, including individualized plan of care and coordination with other care providers ?24/7 contact phone numbers for assistance for urgent and routine care needs. ?The patient may stop care management services at any time by phone call to the office staff. ? ?Patient did not agree to enrollment in care management services and does not wish to consider at this time. ? ?Follow up plan: ?Patient declines engagement by the care management team. Appropriate care team members and provider have been notified via electronic communication. The care management team is available to follow up with the patient after provider conversation with the patient regarding recommendation for care management engagement and subsequent re-referral to the care management team. Patient states she is already working with therapist named Melissa.  ? ?Gwenevere Ghazi  ?Care Guide, Embedded Care Coordination ?[facility]  ?Direct Dial: [phone] ? ?

## 2021-11-05 DIAGNOSIS — G5139 Clonic hemifacial spasm, unspecified: Secondary | ICD-10-CM | POA: Insufficient documentation

## 2021-11-08 ENCOUNTER — Encounter: Payer: Self-pay | Admitting: Internal Medicine

## 2021-11-09 ENCOUNTER — Other Ambulatory Visit: Payer: Self-pay | Admitting: Internal Medicine

## 2021-11-09 DIAGNOSIS — M6240 Contracture of muscle, unspecified site: Secondary | ICD-10-CM | POA: Insufficient documentation

## 2021-11-12 ENCOUNTER — Encounter: Payer: Self-pay | Admitting: Internal Medicine

## 2021-11-13 NOTE — Telephone Encounter (Signed)
Forms have been completed and given to PCP to review and sign.  ?

## 2021-11-16 ENCOUNTER — Other Ambulatory Visit (HOSPITAL_COMMUNITY): Payer: Self-pay

## 2021-11-19 NOTE — Telephone Encounter (Signed)
Forms have been signed and faxed back.

## 2021-11-26 ENCOUNTER — Other Ambulatory Visit (HOSPITAL_COMMUNITY): Payer: Self-pay

## 2021-11-26 ENCOUNTER — Ambulatory Visit: Payer: No Typology Code available for payment source | Admitting: Internal Medicine

## 2021-11-28 ENCOUNTER — Institutional Professional Consult (permissible substitution): Payer: No Typology Code available for payment source | Admitting: Neurology

## 2021-11-28 ENCOUNTER — Ambulatory Visit (HOSPITAL_COMMUNITY)
Admission: EM | Admit: 2021-11-28 | Discharge: 2021-11-28 | Disposition: A | Discharge status: Home / Self Care | Payer: No Typology Code available for payment source | Attending: Nurse Practitioner | Admitting: Nurse Practitioner

## 2021-11-28 ENCOUNTER — Encounter (HOSPITAL_COMMUNITY): Payer: Self-pay

## 2021-11-28 ENCOUNTER — Ambulatory Visit (INDEPENDENT_AMBULATORY_CARE_PROVIDER_SITE_OTHER): Payer: No Typology Code available for payment source

## 2021-11-28 ENCOUNTER — Other Ambulatory Visit: Payer: Self-pay

## 2021-11-28 DIAGNOSIS — R059 Cough, unspecified: Secondary | ICD-10-CM | POA: Diagnosis not present

## 2021-11-28 DIAGNOSIS — R0602 Shortness of breath: Secondary | ICD-10-CM

## 2021-11-28 MED ORDER — ALBUTEROL SULFATE (2.5 MG/3ML) 0.083% IN NEBU
INHALATION_SOLUTION | RESPIRATORY_TRACT | Status: AC
  Filled 2021-11-28: qty 3

## 2021-11-28 MED ORDER — ALBUTEROL SULFATE (2.5 MG/3ML) 0.083% IN NEBU
2.5000 mg | INHALATION_SOLUTION | Freq: Once | RESPIRATORY_TRACT | Status: AC
  Administered 2021-11-28: 2.5 mg via RESPIRATORY_TRACT

## 2021-11-28 NOTE — Discharge Instructions (Addendum)
-   The chest x-ray today does not show pneumonia ?-Please continue using the albuterol as prescribed every 4-6 hours as needed for shortness of breath or wheezing ?-If your symptoms are not improving, please follow-up with your primary care provider ?-Please follow-up with your primary care provider if you desire asthma testing ?

## 2021-11-28 NOTE — ED Provider Notes (Signed)
?[facility] ? ? ? ?CSN: [id number] ?Arrival date & time: 11/28/21  1100 ? ? ?  ? ?History   ?Chief Complaint ?Chief Complaint  ?Patient presents with  ? Shortness of Breath  ? ? ?HPI ?Kaylee Hill is a 41 y.o. female.  ? ?Patient reports shortness of breath for the past 2-3 weeks that has been worsening.  She has been using albuterol inhaler without relief of symptoms; last used last night.  Reports earlier this year, she was diagnosed with pneumonia and wants to make sure it didn't come back.  She denies fevers, body aches, night sweats, chills, eye itching/redness, ear pain, abdominal pain, headache, decreased appetite, and nausea/vomiting, and diarrhea.  Endorses congestion, occasional cough, shortness of breath with activity.   ? ?Patient recently saw primary care provider.  History of anxiety, hypertension, diabetes, anemia, and sleep apnea.  She has not been wearing CPAP.   ? ? ?Past Medical History:  ?Diagnosis Date  ? Anxiety   ? Bipolar 1 disorder (HCC)   ? Depression   ? Gestational diabetes   ? Headache   ? Hypertension   ? ? ?Patient Active Problem List  ? Diagnosis Date Noted  ? Involuntary muscle contractions 11/09/2021  ? Deficiency anemia 10/30/2021  ? Need for hepatitis C screening test 10/29/2021  ? Seasonal allergic rhinitis due to pollen 06/06/2021  ? Need for vaccination 02/07/2021  ? Routine general medical examination at a health care facility 02/07/2021  ? Type II diabetes mellitus with manifestations (HCC) 02/05/2021  ? B12 deficiency 02/05/2021  ? Vitreomacular adhesion of both eyes 11/28/2020  ? OSA (obstructive sleep apnea) 11/28/2020  ? Hyperlipidemia associated with type 2 diabetes mellitus (HCC) 04/17/2020  ? Arthropathy of lumbar facet joint 06/01/2019  ? Primary osteoarthritis of both knees 06/01/2019  ? Vitamin D deficiency 08/12/2018  ? Iron deficiency anemia, unspecified 08/07/2018  ? Class 3 obesity (HCC) 05/20/2018  ? Diabetes mellitus with no complication (HCC)  05/01/2017  ? Bipolar disorder with psychotic features (HCC) 04/28/2017  ? Sickle cell trait (HCC) 12/09/2014  ? Benign essential hypertension 04/09/2013  ? Familial multiple lipoprotein-type hyperlipidemia 04/09/2013  ? ? ?Past Surgical History:  ?Procedure Laterality Date  ? BREAST SURGERY    ? ? ?OB History   ? ? Gravida  ?1  ? Para  ?1  ? Term  ?1  ? Preterm  ?   ? AB  ?   ? Living  ?1  ?  ? ? SAB  ?   ? IAB  ?   ? Ectopic  ?   ? Multiple  ?   ? Live Births  ?1  ?   ?  ?  ? ? ? ?Home Medications   ? ?Prior to Admission medications   ?Medication Sig Start Date End Date Taking? Authorizing Provider  ?benztropine (COGENTIN) 1 MG tablet Take 1 tablet (1 mg total) by mouth 3 (three) times daily. 06/25/21     ?Deutetrabenazine (AUSTEDO) 12 MG TABS Take 2 tablets by mouth 2 (two) times daily. 10/19/21     ?lithium carbonate 300 MG capsule Take 1 capsule (300 mg total) by mouth every evening for 4 days, then 2 capsules (600mg ) every evening for 4 days, then 3 capsules (900mg ) every evening 10/19/21     ?LORazepam (ATIVAN) 0.5 MG tablet Take 1 tablet (0.5 mg total) by mouth 2 (two) times daily. 10/19/21     ?Lurasidone HCl (LATUDA) 60 MG TABS take 1 tablet by  mouth daily with a meal 12/04/20     ?metFORMIN (GLUCOPHAGE-XR) 750 MG 24 hr tablet Take 2 tablets (1,500 mg total) by mouth daily with breakfast. 10/31/21   Etta Grandchild, MD  ?Multiple Vitamin (MULTIVITAMIN) tablet Take by mouth daily. Chew 2 per day    [provider]  ?nebivolol (BYSTOLIC) 5 MG tablet Take 1 tablet (5 mg total) by mouth daily. 02/07/21   Etta Grandchild, MD  ?Probiotic Product (PROBIOTIC PO) Take by mouth daily.    [provider]  ?traZODone (DESYREL) 50 MG tablet take 1/2 - 2 tablet by mouth at night as needed for insomnia 12/04/20     ? ? ?Family History ?Family History  ?Problem Relation Age of Onset  ? Bipolar disorder Mother   ? Diabetes Father   ? Hypertension Father   ? Schizophrenia Father   ? Asthma Brother   ? Healthy  Daughter   ? Diabetes Maternal Grandmother   ? Cancer Maternal Grandmother   ?     breast, pancreatic cancer  ? Dementia Maternal Grandfather   ? ? ?Social History ?Social History  ? ?Tobacco Use  ? Smoking status: Former  ?  Packs/day: 0.10  ?  Years: 1.00  ?  Pack years: 0.10  ?  Types: Cigarettes  ?  Quit date: 2000  ?  Years since quitting: 23.2  ? Smokeless tobacco: Never  ?Vaping Use  ? Vaping Use: Never used  ?Substance Use Topics  ? Alcohol use: No  ? Drug use: No  ? ? ? ?Allergies   ?Patient has no known allergies. ? ? ?Review of Systems ?Review of Systems ? ? ?Physical Exam ?Triage Vital Signs ?ED Triage Vitals  ?Enc Vitals Group  ?   BP 11/28/21 1149 (!) 140/97  ?   Pulse Rate 11/28/21 1149 97  ?   Resp --   ?   Temp 11/28/21 1149 98.2 ?F (36.8 ?C)  ?   Temp Source 11/28/21 1149 Oral  ?   SpO2 11/28/21 1149 98 %  ?   Weight --   ?   Height --   ?   Head Circumference --   ?   Peak Flow --   ?   Pain Score 11/28/21 1147 0  ?   Pain Loc --   ?   Pain Edu? --   ?   Excl. in GC? --   ? ?No data found. ? ?Updated Vital Signs ?BP (!) 140/97 (BP Location: Right Arm)   Pulse 97   Temp 98.2 ?F (36.8 ?C) (Oral)   LMP  (LMP Unknown)   SpO2 98%  ? ?Visual Acuity ?Right Eye Distance:   ?Left Eye Distance:   ?Bilateral Distance:   ? ?Right Eye Near:   ?Left Eye Near:    ?Bilateral Near:    ? ?Physical Exam ?Vitals and nursing note reviewed.  ?Constitutional:   ?   General: She is not in acute distress. ?   Appearance: She is well-developed. She is not toxic-appearing.  ?HENT:  ?   Head: Normocephalic and atraumatic.  ?   Mouth/Throat:  ?   Mouth: Mucous membranes are moist.  ?   Pharynx: No pharyngeal swelling.  ?Eyes:  ?   Extraocular Movements: Extraocular movements intact.  ?Cardiovascular:  ?   Rate and Rhythm: Normal rate and regular rhythm.  ?Pulmonary:  ?   Effort: Pulmonary effort is normal. No tachypnea or accessory muscle usage.  ?  Breath sounds: Examination of the right-lower field reveals wheezing.  Examination of the left-lower field reveals wheezing. Wheezing present.  ?   Comments: Occasional forced inhalation ?Skin: ?   General: Skin is warm and dry.  ?   Capillary Refill: Capillary refill takes less than 2 seconds.  ?   Coloration: Skin is not cyanotic or pale.  ?   Findings: No erythema.  ?Neurological:  ?   Mental Status: She is alert and oriented to person, place, and time.  ?Psychiatric:     ?   Behavior: Behavior is cooperative.  ? ? ? ?UC Treatments / Results  ?Labs ?(all labs ordered are listed, but only abnormal results are displayed) ?Labs Reviewed - No data to display ? ?EKG ? ? ?Radiology ?DG Chest 2 View ? ?Result Date: 11/28/2021 ?CLINICAL DATA:  Shortness of breath and cough x2 weeks EXAM: CHEST - 2 VIEW COMPARISON:  October 11, 2021 FINDINGS: The heart size and mediastinal contours are within normal limits. No focal consolidation. No pleural effusion. No pneumothorax. The visualized skeletal structures are unremarkable. IMPRESSION: No active cardiopulmonary disease. Electronically Signed   By: Maudry Mayhew M.D.   On: 11/28/2021 13:02   ? ?Procedures ?Procedures (including critical care time) ? ?Medications Ordered in UC ?Medications  ?albuterol (PROVENTIL) (2.5 MG/3ML) 0.083% nebulizer solution 2.5 mg (2.5 mg Nebulization Given 11/28/21 1325)  ? ? ?Initial Impression / Assessment and Plan / UC Course  ?I have reviewed the triage vital signs and the nursing notes. ? ?Pertinent labs & imaging results that were available during my care of the patient were reviewed by me and considered in my medical decision making (see chart for details). ? ?  ?Chest x-ray today shows pneumonia from February has resolved.  Lungs are clear to auscultation after albuterol nebulizer.  Discussed with patient that there is a chance she may have some underlying asthma-she plans to follow-up with her primary care provider for official testing of this.  Encouraged treating congestion with Mucinex, saline nasal rinses,  increasing hydration.  Follow-up with primary care provider if symptoms do not improve. ?Final Clinical Impressions(s) / UC Diagnoses  ? ?Final diagnoses:  ?Shortness of breath  ? ? ? ?Discharge Instructions   ? ?  ?

## 2021-11-28 NOTE — ED Notes (Signed)
Breathing treatment continues. 

## 2021-11-28 NOTE — ED Triage Notes (Signed)
Pt presents with SOB and cough x 2 weeks.  ?

## 2021-11-30 ENCOUNTER — Other Ambulatory Visit (HOSPITAL_COMMUNITY): Payer: Self-pay

## 2021-12-05 ENCOUNTER — Other Ambulatory Visit (HOSPITAL_COMMUNITY): Payer: Self-pay

## 2021-12-10 ENCOUNTER — Other Ambulatory Visit (HOSPITAL_COMMUNITY): Payer: Self-pay

## 2021-12-10 MED ORDER — AUSTEDO 12 MG PO TABS
24.0000 mg | ORAL_TABLET | Freq: Two times a day (BID) | ORAL | 3 refills | Status: DC
  Filled 2022-03-01: qty 120, 30d supply, fill #0
  Filled 2022-04-19 (×2): qty 120, 30d supply, fill #1

## 2021-12-11 ENCOUNTER — Encounter: Payer: Self-pay | Admitting: Internal Medicine

## 2021-12-11 ENCOUNTER — Other Ambulatory Visit (HOSPITAL_COMMUNITY): Payer: Self-pay

## 2021-12-11 ENCOUNTER — Ambulatory Visit (INDEPENDENT_AMBULATORY_CARE_PROVIDER_SITE_OTHER): Payer: No Typology Code available for payment source | Admitting: Internal Medicine

## 2021-12-11 VITALS — BP 126/82 | HR 91 | Temp 98.8°F | Resp 16 | Ht 62.0 in | Wt 240.0 lb

## 2021-12-11 DIAGNOSIS — H6981 Other specified disorders of Eustachian tube, right ear: Secondary | ICD-10-CM | POA: Insufficient documentation

## 2021-12-11 DIAGNOSIS — D509 Iron deficiency anemia, unspecified: Secondary | ICD-10-CM

## 2021-12-11 DIAGNOSIS — E118 Type 2 diabetes mellitus with unspecified complications: Secondary | ICD-10-CM

## 2021-12-11 DIAGNOSIS — E538 Deficiency of other specified B group vitamins: Secondary | ICD-10-CM | POA: Diagnosis not present

## 2021-12-11 DIAGNOSIS — I10 Essential (primary) hypertension: Secondary | ICD-10-CM

## 2021-12-11 DIAGNOSIS — J301 Allergic rhinitis due to pollen: Secondary | ICD-10-CM

## 2021-12-11 LAB — CBC WITH DIFFERENTIAL/PLATELET
Basophils Absolute: 0.1 10*3/uL (ref 0.0–0.1)
Basophils Relative: 0.7 % (ref 0.0–3.0)
Eosinophils Absolute: 0.3 10*3/uL (ref 0.0–0.7)
Eosinophils Relative: 2.8 % (ref 0.0–5.0)
HCT: 36.1 % (ref 36.0–46.0)
Hemoglobin: 11.8 g/dL — ABNORMAL LOW (ref 12.0–15.0)
Lymphocytes Relative: 31.7 % (ref 12.0–46.0)
Lymphs Abs: 3.1 10*3/uL (ref 0.7–4.0)
MCHC: 32.6 g/dL (ref 30.0–36.0)
MCV: 75.3 fl — ABNORMAL LOW (ref 78.0–100.0)
Monocytes Absolute: 0.6 10*3/uL (ref 0.1–1.0)
Monocytes Relative: 6.3 % (ref 3.0–12.0)
Neutro Abs: 5.8 10*3/uL (ref 1.4–7.7)
Neutrophils Relative %: 58.5 % (ref 43.0–77.0)
Platelets: 287 10*3/uL (ref 150.0–400.0)
RBC: 4.8 Mil/uL (ref 3.87–5.11)
RDW: 14.5 % (ref 11.5–15.5)
WBC: 9.9 10*3/uL (ref 4.0–10.5)

## 2021-12-11 MED ORDER — FLUTICASONE PROPIONATE 50 MCG/ACT NA SUSP
2.0000 | Freq: Every day | NASAL | 1 refills | Status: DC
  Filled 2021-12-11: qty 48, 90d supply, fill #0
  Filled 2022-08-19: qty 16, 30d supply, fill #1

## 2021-12-11 MED ORDER — METHYLPREDNISOLONE 4 MG PO TBPK
ORAL_TABLET | ORAL | 0 refills | Status: AC
  Filled 2021-12-11: qty 21, 6d supply, fill #0

## 2021-12-11 MED ORDER — LEVOCETIRIZINE DIHYDROCHLORIDE 5 MG PO TABS
5.0000 mg | ORAL_TABLET | Freq: Every evening | ORAL | 1 refills | Status: DC
  Filled 2021-12-11: qty 90, 90d supply, fill #0
  Filled 2022-03-12: qty 30, 30d supply, fill #1
  Filled 2022-08-19: qty 30, 30d supply, fill #2
  Filled 2022-09-30: qty 30, 30d supply, fill #3

## 2021-12-11 NOTE — Patient Instructions (Signed)
Eustachian Tube Dysfunction °Eustachian tube dysfunction refers to a condition in which a blockage develops in the narrow passage that connects the middle ear to the back of the nose (eustachian tube). The eustachian tube regulates air pressure in the middle ear by letting air move between the ear and nose. It also helps to drain fluid from the middle ear space. °Eustachian tube dysfunction can affect one or both ears. When the eustachian tube does not function properly, air pressure, fluid, or both can build up in the middle ear. °What are the causes? °This condition occurs when the eustachian tube becomes blocked or cannot open normally. Common causes of this condition include: °Ear infections. °Colds and other infections that affect the nose, mouth, and throat (upper respiratory tract). °Allergies. °Irritation from cigarette smoke. °Irritation from stomach acid coming up into the esophagus (gastroesophageal reflux). The esophagus is the part of the body that moves food from the mouth to the stomach. °Sudden changes in air pressure, such as from descending in an airplane or scuba diving. °Abnormal growths in the nose or throat, such as: °Growths that line the nose (nasal polyps). °Abnormal growth of cells (tumors). °Enlarged tissue at the back of the throat (adenoids). °What increases the risk? °You are more likely to develop this condition if: °You smoke. °You are overweight. °You are a child who has: °Certain birth defects of the mouth, such as cleft palate. °Large tonsils or adenoids. °What are the signs or symptoms? °Common symptoms of this condition include: °A feeling of fullness in the ear. °Ear pain. °Clicking or popping noises in the ear. °Ringing in the ear (tinnitus). °Hearing loss. °Loss of balance. °Dizziness. °Symptoms may get worse when the air pressure around you changes, such as when you travel to an area of high elevation, fly on an airplane, or go scuba diving. °How is this diagnosed? °This  condition may be diagnosed based on: °Your symptoms. °A physical exam of your ears, nose, and throat. °Tests, such as those that measure: °The movement of your eardrum. °Your hearing (audiometry). °How is this treated? °Treatment depends on the cause and severity of your condition. °In mild cases, you may relieve your symptoms by moving air into your ears. This is called "popping the ears." °In more severe cases, or if you have symptoms of fluid in your ears, treatment may include: °Medicines to relieve congestion (decongestants). °Medicines that treat allergies (antihistamines). °Nasal sprays or ear drops that contain medicines that reduce swelling (steroids). °A procedure to drain the fluid in your eardrum. In this procedure, a small tube may be placed in the eardrum to: °Drain the fluid. °Restore the air in the middle ear space. °A procedure to insert a balloon device through the nose to inflate the opening of the eustachian tube (balloon dilation). °Follow these instructions at home: °Lifestyle °Do not do any of the following until your health care provider approves: °Travel to high altitudes. °Fly in airplanes. °Work in a pressurized cabin or room. °Scuba dive. °Do not use any products that contain nicotine or tobacco. These products include cigarettes, chewing tobacco, and vaping devices, such as e-cigarettes. If you need help quitting, ask your health care provider. °Keep your ears dry. Wear fitted earplugs during showering and bathing. Dry your ears completely after. °General instructions °Take over-the-counter and prescription medicines only as told by your health care provider. °Use techniques to help pop your ears as recommended by your health care provider. These may include: °Chewing gum. °Yawning. °Frequent, forceful swallowing. °Closing   your mouth, holding your nose closed, and gently blowing as if you are trying to blow air out of your nose. °Keep all follow-up visits. This is important. °Contact a  health care provider if: °Your symptoms do not go away after treatment. °Your symptoms come back after treatment. °You are unable to pop your ears. °You have: °A fever. °Pain in your ear. °Pain in your head or neck. °Fluid draining from your ear. °Your hearing suddenly changes. °You become very dizzy. °You lose your balance. °Get help right away if: °You have a sudden, severe increase in any of your symptoms. °Summary °Eustachian tube dysfunction refers to a condition in which a blockage develops in the eustachian tube. °It can be caused by ear infections, allergies, inhaled irritants, or abnormal growths in the nose or throat. °Symptoms may include ear pain or fullness, hearing loss, or ringing in the ears. °Mild cases are treated with techniques to unblock the ears, such as yawning or chewing gum. °More severe cases are treated with medicines or procedures. °This information is not intended to replace advice given to you by your health care provider. Make sure you discuss any questions you have with your health care provider. °Document Revised: 10/30/2020 Document Reviewed: 10/30/2020 °Elsevier Patient Education © 2022 [employer] ° °

## 2021-12-11 NOTE — Progress Notes (Signed)
? ?Subjective:  ?Patient ID: Kaylee Hill, female    DOB: Jun 23, 1981  Age: 41 y.o. MRN: [id number] ? ?CC: Anemia and Allergic Rhinitis  ? ? ?HPI ?Kaylee Hill presents for f/up - ? ?She complains of a 3-week history of nasal congestion, ears feeling clogged, and postnasal drip.  She is taking Claritin-D and fluticasone.  She denies cough, fever, chills, chest pain, or hemoptysis. ? ?Outpatient Medications Prior to Visit  ?Medication Sig Dispense Refill  ?? Deutetrabenazine (AUSTEDO) 12 MG TABS Take 2 tablets by mouth 2 (two) times daily. 120 tablet 3  ?? Deutetrabenazine (AUSTEDO) 12 MG TABS Take 2 tablets (24 mg) by mouth 2 (two) times daily. 120 tablet 3  ?? lithium carbonate 300 MG capsule Take 1 capsule (300 mg total) by mouth every evening for 4 days, then 2 capsules (600mg ) every evening for 4 days, then 3 capsules (900mg ) every evening 90 capsule 3  ?? LORazepam (ATIVAN) 0.5 MG tablet Take 1 tablet (0.5 mg total) by mouth 2 (two) times daily. 60 tablet 3  ?? metFORMIN (GLUCOPHAGE-XR) 750 MG 24 hr tablet Take 2 tablets (1,500 mg total) by mouth daily with breakfast. 180 tablet 1  ?? Multiple Vitamin (MULTIVITAMIN) tablet Take by mouth daily. Chew 2 per day    ?? nebivolol (BYSTOLIC) 5 MG tablet Take 1 tablet (5 mg total) by mouth daily. 90 tablet 1  ?? Probiotic Product (PROBIOTIC PO) Take by mouth daily.    ?? traZODone (DESYREL) 50 MG tablet take 1/2 - 2 tablet by mouth at night as needed for insomnia 180 tablet 1  ?? benztropine (COGENTIN) 1 MG tablet Take 1 tablet (1 mg total) by mouth 3 (three) times daily. 90 tablet 3  ?? Lurasidone HCl (LATUDA) 60 MG TABS take 1 tablet by mouth daily with a meal 30 tablet 3  ? ?No facility-administered medications prior to visit.  ? ? ?ROS ?Review of Systems  ?Constitutional: Negative.   ?HENT:  Positive for congestion and ear pain. Negative for facial swelling, postnasal drip, rhinorrhea, sinus pressure and sore throat.   ?Respiratory: Negative.  Negative for  cough, shortness of breath and wheezing.   ?Cardiovascular:  Negative for chest pain, palpitations and leg swelling.  ?Gastrointestinal:  Negative for abdominal pain, diarrhea, nausea and vomiting.  ?Genitourinary:  Negative for difficulty urinating.  ?Musculoskeletal: Negative.   ?Skin: Negative.   ?Neurological:  Negative for dizziness and weakness.  ?Hematological:  Negative for adenopathy. Does not bruise/bleed easily.  ?Psychiatric/Behavioral: Negative.    ? ?Objective:  ?BP 126/82 (BP Location: Right Arm, Patient Position: Sitting, Cuff Size: Large)   Pulse 91   Temp 98.8 ?F (37.1 ?C) (Oral)   Resp 16   Ht 5\' 2"  (1.575 m)   Wt 240 lb (108.9 kg)   LMP 11/10/2021 (Exact Date)   SpO2 99%   BMI 43.90 kg/m?  ? ?BP Readings from Last 3 Encounters:  ?12/11/21 126/82  ?11/28/21 (!) 140/97  ?10/29/21 126/84  ? ? ?Wt Readings from Last 3 Encounters:  ?12/11/21 240 lb (108.9 kg)  ?10/29/21 240 lb (108.9 kg)  ?10/23/21 237 lb 9.6 oz (107.8 kg)  ? ? ?Physical Exam ?Vitals reviewed.  ?HENT:  ?   Right Ear: Hearing, tympanic membrane, ear canal and external ear normal. No middle ear effusion. There is no impacted cerumen.  ?   Left Ear: Hearing, tympanic membrane, ear canal and external ear normal.  No middle ear effusion. There is no impacted cerumen.  ?  Nose: Mucosal edema, congestion and rhinorrhea present. Rhinorrhea is clear.  ?   Right Turbinates: Swollen and pale. Not enlarged.  ?   Left Turbinates: Swollen and pale. Not enlarged.  ?   Right Sinus: No maxillary sinus tenderness or frontal sinus tenderness.  ?   Left Sinus: No maxillary sinus tenderness or frontal sinus tenderness.  ?Eyes:  ?   General: No scleral icterus. ?   Conjunctiva/sclera: Conjunctivae normal.  ?Cardiovascular:  ?   Rate and Rhythm: Normal rate and regular rhythm.  ?   Heart sounds: No murmur heard. ?Pulmonary:  ?   Effort: Pulmonary effort is normal.  ?   Breath sounds: No stridor. No wheezing, rhonchi or rales.  ?Abdominal:  ?    General: Abdomen is protuberant.  ?   Palpations: There is no mass.  ?   Tenderness: There is no abdominal tenderness. There is no guarding.  ?   Hernia: No hernia is present.  ?Musculoskeletal:  ?   Cervical back: Neck supple.  ?Lymphadenopathy:  ?   Cervical: No cervical adenopathy.  ?Skin: ?   General: Skin is warm.  ?Neurological:  ?   General: No focal deficit present.  ?   Mental Status: Mental status is at baseline.  ?Psychiatric:     ?   Mood and Affect: Mood normal.     ?   Behavior: Behavior normal.  ? ? ?Lab Results  ?Component Value Date  ? WBC 9.9 12/11/2021  ? HGB 11.8 (L) 12/11/2021  ? HCT 36.1 12/11/2021  ? PLT 287.0 12/11/2021  ? GLUCOSE 150 (H) 10/29/2021  ? CHOL 178 04/17/2020  ? TRIG 158 (H) 04/17/2020  ? HDL 30 (L) 04/17/2020  ? LDLCALC 121 (H) 04/17/2020  ? ALT 14 10/23/2021  ? AST 22 10/23/2021  ? NA 137 10/29/2021  ? K 4.2 10/29/2021  ? CL 101 10/29/2021  ? CREATININE 0.60 10/29/2021  ? BUN 7 10/29/2021  ? CO2 30 10/29/2021  ? TSH 1.890 10/23/2021  ? HGBA1C 7.1 (H) 10/29/2021  ? MICROALBUR <0.7 06/06/2021  ? ? ?DG Chest 2 View ? ?Result Date: 11/28/2021 ?CLINICAL DATA:  Shortness of breath and cough x2 weeks EXAM: CHEST - 2 VIEW COMPARISON:  October 11, 2021 FINDINGS: The heart size and mediastinal contours are within normal limits. No focal consolidation. No pleural effusion. No pneumothorax. The visualized skeletal structures are unremarkable. IMPRESSION: No active cardiopulmonary disease. Electronically Signed   By: Maudry Mayhew M.D.   On: 11/28/2021 13:02  ? ? ?Assessment & Plan:  ? ?Kaylee Hill was seen today for anemia and allergic rhinitis . ? ?Diagnoses and all orders for this visit: ? ?Benign essential hypertension ? ?Type II diabetes mellitus with manifestations (HCC) ? ?Iron deficiency anemia, unspecified iron deficiency anemia type-her H&H have improved. ?-     CBC with Differential/Platelet; Future ?-     CBC with Differential/Platelet ? ?B12 deficiency- Her H&H have improved. ?-      CBC with Differential/Platelet; Future ?-     CBC with Differential/Platelet ? ?Seasonal allergic rhinitis due to pollen- I recommended she switch to Xyzal and to take a 6-day course of methylprednisolone. ?-     methylPREDNISolone (MEDROL DOSEPAK) 4 MG TBPK tablet; Take as directed. ?-     levocetirizine (XYZAL) 5 MG tablet; Take 1 tablet (5 mg total) by mouth every evening. ?-     fluticasone (FLONASE) 50 MCG/ACT nasal spray; Place 2 sprays into both nostrils daily. ? ?Eustachian tube dysfunction,  right- Will treat with a 6-day course of methylprednisolone. ?-     methylPREDNISolone (MEDROL DOSEPAK) 4 MG TBPK tablet; Take as directed. ? ? ?I have discontinued Anner CreteAundrea D. Diss's Latuda and benztropine. I am also having her start on methylPREDNISolone, levocetirizine, and fluticasone. Additionally, I am having her maintain her multivitamin, traZODone, nebivolol, Austedo, lithium carbonate, LORazepam, Probiotic Product (PROBIOTIC PO), metFORMIN, and Austedo. ? ?Meds ordered this encounter  ?Medications  ?? methylPREDNISolone (MEDROL DOSEPAK) 4 MG TBPK tablet  ?  Sig: Take as directed.  ?  Dispense:  21 tablet  ?  Refill:  0  ?? levocetirizine (XYZAL) 5 MG tablet  ?  Sig: Take 1 tablet (5 mg total) by mouth every evening.  ?  Dispense:  90 tablet  ?  Refill:  1  ?? fluticasone (FLONASE) 50 MCG/ACT nasal spray  ?  Sig: Place 2 sprays into both nostrils daily.  ?  Dispense:  48 g  ?  Refill:  1  ? ? ? ?Follow-up: Return in about 3 months (around 03/12/2022). ? ?Sanda Lingerhomas Jamiyla Ishee, MD ?

## 2021-12-17 ENCOUNTER — Other Ambulatory Visit (HOSPITAL_COMMUNITY): Payer: Self-pay

## 2021-12-19 ENCOUNTER — Encounter: Payer: Self-pay | Admitting: Internal Medicine

## 2021-12-24 DIAGNOSIS — Z0279 Encounter for issue of other medical certificate: Secondary | ICD-10-CM

## 2021-12-24 NOTE — Telephone Encounter (Signed)
Pt is wanting an update to see if the ADA LOA pw can be filled out by Dr. Yetta Barre for an Extension. Pt states that she still has no dx or medication to help her. ? ?Please advise ?

## 2021-12-25 ENCOUNTER — Other Ambulatory Visit (HOSPITAL_COMMUNITY): Payer: Self-pay

## 2021-12-25 NOTE — Telephone Encounter (Signed)
Forms have been completed, signed by PCP and faxed back.  ? ?Copy given to charge ?Copy sent to pt ?Original filed with CMA ?

## 2022-01-08 ENCOUNTER — Ambulatory Visit (HOSPITAL_COMMUNITY)
Admission: EM | Admit: 2022-01-08 | Discharge: 2022-01-08 | Disposition: A | Discharge status: Home / Self Care | Payer: No Typology Code available for payment source | Attending: Physician Assistant | Admitting: Physician Assistant

## 2022-01-08 ENCOUNTER — Other Ambulatory Visit (HOSPITAL_COMMUNITY): Payer: Self-pay

## 2022-01-08 ENCOUNTER — Encounter (HOSPITAL_COMMUNITY): Payer: Self-pay | Admitting: Emergency Medicine

## 2022-01-08 DIAGNOSIS — J0110 Acute frontal sinusitis, unspecified: Secondary | ICD-10-CM

## 2022-01-08 DIAGNOSIS — J069 Acute upper respiratory infection, unspecified: Secondary | ICD-10-CM | POA: Diagnosis not present

## 2022-01-08 DIAGNOSIS — H1033 Unspecified acute conjunctivitis, bilateral: Secondary | ICD-10-CM | POA: Diagnosis not present

## 2022-01-08 MED ORDER — POLYMYXIN B-TRIMETHOPRIM 10000-0.1 UNIT/ML-% OP SOLN
1.0000 [drp] | OPHTHALMIC | 0 refills | Status: DC
  Filled 2022-01-08: qty 10, 16d supply, fill #0

## 2022-01-08 MED ORDER — AMOXICILLIN 500 MG PO CAPS
500.0000 mg | ORAL_CAPSULE | Freq: Three times a day (TID) | ORAL | 0 refills | Status: DC
  Filled 2022-01-08: qty 21, 7d supply, fill #0

## 2022-01-08 NOTE — ED Triage Notes (Signed)
Pt having red eye for a couple days that are crusted shut when wakes up in mornings.  ?Eyes feel dry and burning, using drop eye drops.  ?

## 2022-01-08 NOTE — ED Provider Notes (Signed)
?[facility] ? ? ? ?CSN: [id number] ?Arrival date & time: 01/08/22  1000 ? ? ?  ? ?History   ?Chief Complaint ?Chief Complaint  ?Patient presents with  ? Conjunctivitis  ? ? ?HPI ?Kaylee Hill is a 41 y.o. female.  ? ?41 year old female presents with bilateral red eyes, sinus congestion, and cough.  Patient relates that she has been around her daughter who was diagnosed with pinkeye about a week to 10 days ago.  Patient indicates over the past 3 days she woke and had bilateral matting and crusting of both eyes.  Patient relates that she has had redness of both eyes, sensitivity to light, bilateral irritation, itching associated.  Patient indicates she is not having any vision problems, no loss of vision, or blurred vision.  Patient indicates she has also had some sinus congestion mainly frontal and maxillary, nasal congestion, production has been thick, purulent, frequent.  Patient indicates she has had some intermittent chest congestion with cough.  Patient without fever, chills, no vision changes, and minimal relief with her fluticasone and Zyrtec OTC.  Patient does have a history of allergies. ? ? ?Conjunctivitis ? ? ?Past Medical History:  ?Diagnosis Date  ? Anxiety   ? Bipolar 1 disorder (HCC)   ? Depression   ? Gestational diabetes   ? Headache   ? Hypertension   ? ? ?Patient Active Problem List  ? Diagnosis Date Noted  ? Eustachian tube dysfunction, right 12/11/2021  ? Involuntary muscle contractions 11/09/2021  ? Seasonal allergic rhinitis due to pollen 06/06/2021  ? Need for vaccination 02/07/2021  ? Routine general medical examination at a health care facility 02/07/2021  ? Type II diabetes mellitus with manifestations (HCC) 02/05/2021  ? B12 deficiency 02/05/2021  ? Vitreomacular adhesion of both eyes 11/28/2020  ? OSA (obstructive sleep apnea) 11/28/2020  ? Hyperlipidemia associated with type 2 diabetes mellitus (HCC) 04/17/2020  ? Arthropathy of lumbar facet joint 06/01/2019  ? Primary  osteoarthritis of both knees 06/01/2019  ? Vitamin D deficiency 08/12/2018  ? Iron deficiency anemia, unspecified 08/07/2018  ? Class 3 obesity (HCC) 05/20/2018  ? Diabetes mellitus with no complication (HCC) 05/01/2017  ? Bipolar disorder with psychotic features (HCC) 04/28/2017  ? Sickle cell trait (HCC) 12/09/2014  ? Benign essential hypertension 04/09/2013  ? Familial multiple lipoprotein-type hyperlipidemia 04/09/2013  ? ? ?Past Surgical History:  ?Procedure Laterality Date  ? BREAST SURGERY    ? ? ?OB History   ? ? Gravida  ?1  ? Para  ?1  ? Term  ?1  ? Preterm  ?   ? AB  ?   ? Living  ?1  ?  ? ? SAB  ?   ? IAB  ?   ? Ectopic  ?   ? Multiple  ?   ? Live Births  ?1  ?   ?  ?  ? ? ? ?Home Medications   ? ?Prior to Admission medications   ?Medication Sig Start Date End Date Taking? Authorizing Provider  ?amoxicillin (AMOXIL) 500 MG capsule Take 1 capsule (500 mg total) by mouth 3 (three) times daily. 01/08/22  Yes Ellsworth Lennox, PA-C  ?trimethoprim-polymyxin b (POLYTRIM) ophthalmic solution Place 1 drop into both eyes every 4 (four) hours. 01/08/22  Yes Ellsworth Lennox, PA-C  ?Deutetrabenazine (AUSTEDO) 12 MG TABS Take 2 tablets by mouth 2 (two) times daily. 10/19/21     ?Deutetrabenazine (AUSTEDO) 12 MG TABS Take 2 tablets (24 mg) by mouth 2 (two)  times daily. 12/10/21     ?fluticasone (FLONASE) 50 MCG/ACT nasal spray Place 2 sprays into both nostrils daily. 12/11/21   Etta Grandchild, MD  ?levocetirizine (XYZAL) 5 MG tablet Take 1 tablet (5 mg total) by mouth every evening. 12/11/21   Etta Grandchild, MD  ?lithium carbonate 300 MG capsule Take 1 capsule (300 mg total) by mouth every evening for 4 days, then 2 capsules (600mg ) every evening for 4 days, then 3 capsules (900mg ) every evening 10/19/21     ?LORazepam (ATIVAN) 0.5 MG tablet Take 1 tablet (0.5 mg total) by mouth 2 (two) times daily. 10/19/21     ?metFORMIN (GLUCOPHAGE-XR) 750 MG 24 hr tablet Take 2 tablets (1,500 mg total) by mouth daily with breakfast. 10/31/21    10/21/21, MD  ?Multiple Vitamin (MULTIVITAMIN) tablet Take by mouth daily. Chew 2 per day    [provider]  ?nebivolol (BYSTOLIC) 5 MG tablet Take 1 tablet (5 mg total) by mouth daily. 02/07/21   Etta Grandchild, MD  ?Probiotic Product (PROBIOTIC PO) Take by mouth daily.    [provider]  ?traZODone (DESYREL) 50 MG tablet take 1/2 - 2 tablet by mouth at night as needed for insomnia 12/04/20     ? ? ?Family History ?Family History  ?Problem Relation Age of Onset  ? Bipolar disorder Mother   ? Diabetes Father   ? Hypertension Father   ? Schizophrenia Father   ? Asthma Brother   ? Healthy Daughter   ? Diabetes Maternal Grandmother   ? Cancer Maternal Grandmother   ?     breast, pancreatic cancer  ? Dementia Maternal Grandfather   ? ? ?Social History ?Social History  ? ?Tobacco Use  ? Smoking status: Former  ?  Packs/day: 0.10  ?  Years: 1.00  ?  Pack years: 0.10  ?  Types: Cigarettes  ?  Quit date: 2000  ?  Years since quitting: 23.3  ? Smokeless tobacco: Never  ?Vaping Use  ? Vaping Use: Never used  ?Substance Use Topics  ? Alcohol use: No  ? Drug use: No  ? ? ? ?Allergies   ?Patient has no known allergies. ? ? ?Review of Systems ?Review of Systems  ?HENT:  Positive for rhinorrhea, sinus pressure and sinus pain.   ?Eyes:  Positive for photophobia, discharge, redness and itching.  ?Respiratory:  Positive for cough.   ? ? ?Physical Exam ?Triage Vital Signs ?ED Triage Vitals  ?Enc Vitals Group  ?   BP 01/08/22 1051 (!) 183/93  ?   Pulse Rate 01/08/22 1051 82  ?   Resp 01/08/22 1051 20  ?   Temp 01/08/22 1051 98.1 ?F (36.7 ?C)  ?   Temp src --   ?   SpO2 01/08/22 1051 97 %  ?   Weight --   ?   Height --   ?   Head Circumference --   ?   Peak Flow --   ?   Pain Score 01/08/22 1050 2  ?   Pain Loc --   ?   Pain Edu? --   ?   Excl. in GC? --   ? ?No data found. ? ?Updated Vital Signs ?BP (!) 183/93 (BP Location: Right Arm)   Pulse 82   Temp 98.1 ?F (36.7 ?C)   Resp 20   LMP 12/06/2021   SpO2 97%   ? ?Visual Acuity ?Right Eye Distance:   ?Left Eye Distance:   ?  Bilateral Distance:   ? ?Right Eye Near:   ?Left Eye Near:    ?Bilateral Near:    ? ?Physical Exam ?Constitutional:   ?   Appearance: Normal appearance.  ?HENT:  ?   Right Ear: Tympanic membrane and ear canal normal.  ?   Left Ear: Tympanic membrane and ear canal normal.  ?   Mouth/Throat:  ?   Mouth: Mucous membranes are moist.  ?   Pharynx: Oropharynx is clear.  ?   Comments: Mouth: Pharynx with minimal redness, no exudate. ?Eyes:  ?   General: Lids are normal.  ?   Conjunctiva/sclera:  ?   Right eye: Right conjunctiva is injected. Exudate present.  ?   Left eye: Left conjunctiva is injected. Exudate present.  ?   Comments: Eyes: Bilateral conjunctiva with redness, purulent matting present bilaterally.  Upper and lower eyelids bilaterally without swelling.  EOMI bilaterally.  PERLA bilaterally.  ?Neck:  ?   Comments: Neck: No lymphadenopathy present bilaterally. ?Neurological:  ?   Mental Status: She is alert.  ? ? ? ?UC Treatments / Results  ?Labs ?(all labs ordered are listed, but only abnormal results are displayed) ?Labs Reviewed - No data to display ? ?EKG ? ? ?Radiology ?No results found. ? ?Procedures ?Procedures (including critical care time) ? ?Medications Ordered in UC ?Medications - No data to display ? ?Initial Impression / Assessment and Plan / UC Course  ?I have reviewed the triage vital signs and the nursing notes. ? ?Pertinent labs & imaging results that were available during my care of the patient were reviewed by me and considered in my medical decision making (see chart for details). ? ?  ?Plan: ?Patient advised to use the eyedrops 1 drop each eye every 4 hours for the next several days until he has clear. ?Patient advised to take the antibiotic as directed Amoxil 500 mg 3 times daily for the next 7 days ?Patient advised to use warm compresses frequently to help decrease the mattering of the eyes, followed by cool compresses to  reduce the irritation. ?Patient advised to follow-up with PCP or return to urgent care for reevaluation if symptoms fail to improve over the next 7 to 10 days or increases. ?Final Clinical Impressions(s) / UC Diagnos

## 2022-01-08 NOTE — Discharge Instructions (Addendum)
Advise to use warm compresses to's 3-4 times daily to help clear the matting. ?Advised patient to take medication as directed. ?Advised patient to use Mucinex OTC for the nasal congestion. ?Advised patient to follow-up with PCP or return to urgent care if symptoms fail to improve over the next 7 to 10 days. ?

## 2022-01-10 ENCOUNTER — Encounter: Payer: Self-pay | Admitting: Internal Medicine

## 2022-01-12 ENCOUNTER — Other Ambulatory Visit (HOSPITAL_COMMUNITY): Payer: Self-pay

## 2022-01-17 ENCOUNTER — Other Ambulatory Visit (HOSPITAL_COMMUNITY): Payer: Self-pay

## 2022-01-19 DIAGNOSIS — H6690 Otitis media, unspecified, unspecified ear: Secondary | ICD-10-CM | POA: Insufficient documentation

## 2022-01-19 DIAGNOSIS — Z8701 Personal history of pneumonia (recurrent): Secondary | ICD-10-CM | POA: Insufficient documentation

## 2022-01-19 DIAGNOSIS — F329 Major depressive disorder, single episode, unspecified: Secondary | ICD-10-CM | POA: Insufficient documentation

## 2022-01-19 DIAGNOSIS — F419 Anxiety disorder, unspecified: Secondary | ICD-10-CM | POA: Insufficient documentation

## 2022-01-21 ENCOUNTER — Other Ambulatory Visit (HOSPITAL_COMMUNITY): Payer: Self-pay

## 2022-01-21 MED ORDER — LITHIUM CARBONATE 300 MG PO CAPS
ORAL_CAPSULE | ORAL | 3 refills | Status: DC
  Filled 2022-01-21: qty 90, 35d supply, fill #0
  Filled 2022-01-21: qty 90, 30d supply, fill #0

## 2022-01-21 MED ORDER — AUSTEDO 12 MG PO TABS
24.0000 mg | ORAL_TABLET | Freq: Two times a day (BID) | ORAL | 3 refills | Status: DC
  Filled 2022-03-01: qty 120, 60d supply, fill #0

## 2022-01-21 MED ORDER — LORAZEPAM 1 MG PO TABS
1.0000 mg | ORAL_TABLET | Freq: Three times a day (TID) | ORAL | 3 refills | Status: DC
Start: 2022-01-21 — End: 2022-04-15
  Filled 2022-01-21 – 2022-02-04 (×2): qty 90, 30d supply, fill #0
  Filled 2022-03-08: qty 90, 30d supply, fill #1
  Filled 2022-04-10: qty 90, 30d supply, fill #2

## 2022-01-29 ENCOUNTER — Encounter (HOSPITAL_COMMUNITY): Payer: Self-pay

## 2022-01-29 ENCOUNTER — Telehealth: Payer: Self-pay

## 2022-01-29 ENCOUNTER — Emergency Department (HOSPITAL_COMMUNITY)
Admission: EM | Admit: 2022-01-29 | Discharge: 2022-01-29 | Disposition: A | Discharge status: Home / Self Care | Payer: No Typology Code available for payment source | Attending: Emergency Medicine | Admitting: Emergency Medicine

## 2022-01-29 ENCOUNTER — Other Ambulatory Visit (HOSPITAL_COMMUNITY): Payer: Self-pay

## 2022-01-29 ENCOUNTER — Other Ambulatory Visit: Payer: Self-pay

## 2022-01-29 DIAGNOSIS — I10 Essential (primary) hypertension: Secondary | ICD-10-CM | POA: Insufficient documentation

## 2022-01-29 DIAGNOSIS — Z79899 Other long term (current) drug therapy: Secondary | ICD-10-CM | POA: Diagnosis not present

## 2022-01-29 DIAGNOSIS — H6593 Unspecified nonsuppurative otitis media, bilateral: Secondary | ICD-10-CM | POA: Diagnosis not present

## 2022-01-29 DIAGNOSIS — H938X3 Other specified disorders of ear, bilateral: Secondary | ICD-10-CM | POA: Diagnosis present

## 2022-01-29 MED ORDER — CARBAMIDE PEROXIDE 6.5 % OT SOLN
5.0000 [drp] | OTIC | Status: DC
  Filled 2022-01-29: qty 15

## 2022-01-29 NOTE — Discharge Instructions (Signed)
Return to the ED with any new symptom such as fevers Please call the ENT doctor I have referred you to make an appointment to be seen.  His number is attached to this document. Please continue taking Xyzal, Flonase.  Please begin utilizing nasal rinses such as a Nettie pot.  Please also begin taking Mucinex or Sudafed for sinus congestion.  I believe this will help alleviate much of the pressure you are describing. Please read the attached informational guide concerning otitis media effusions.

## 2022-01-29 NOTE — ED Triage Notes (Signed)
Pt arrived POV from home c/o ear fullness and itching stating she is starting to lose her hearing in her ears. Pt states this started about a month ago. It feels like there is something there that she cannot get to.

## 2022-01-29 NOTE — ED Notes (Signed)
All discharge instructions reviewed with patient and patient verbalized understanding of same. Patient stable and ambulatory at time of discharge.  ?

## 2022-01-29 NOTE — ED Provider Notes (Signed)
[facility] EMERGENCY DEPARTMENT Provider Note   CSN: [id number] Arrival date & time: 01/29/22  8295     History  Chief Complaint  Patient presents with   Ear Fullness    Loss of hearing      Kaylee Hill is a 41 y.o. female with history of anxiety, bipolar disorder, depression, and hypertension.  Patient presents to the ED for evaluation of ear fullness.  Patient states that for the last 1 month her right ear has become increasingly irritated, with a sensation that it is "full".  Patient states over the last 3 weeks her left ear has also become increasingly irritated with an aural sense of fullness.  Patient states that she was seen via telehealth visit on 5/20 and provided with prescription for Augmentin, steroid.  Patient was also advised by her PCP to utilize [employer], Flonase, Claritin, Sudafed for decongestion.  The patient reports that she has not been in utilizing any of these over-the-counter methods, only using Augmentin and steroids.  On chart review, appears that the patient has a longstanding history of eustachian tube dysfunction.  Patient was also seen on 5/9 at local urgent care center for bilateral conjunctivitis.  At this visit, the patient was provided with 7 days of amoxicillin which she reports she took to completion.  Patient endorsing bilateral ear fullness, congestion.  Patient denies any drainage from ear however she does state that she feels as if "something needs to come out".  The patient denies any fevers, nausea, vomiting, diarrhea, sore throat, eye pain, eye discharge.   Ear Fullness      Home Medications Prior to Admission medications   Medication Sig Start Date End Date Taking? Authorizing Provider  amoxicillin (AMOXIL) 500 MG capsule Take 1 capsule (500 mg total) by mouth 3 (three) times daily. 01/08/22   Ellsworth Lennox, PA-C  Deutetrabenazine (AUSTEDO) 12 MG TABS Take 2 tablets by mouth 2 (two) times daily. 10/19/21      Deutetrabenazine (AUSTEDO) 12 MG TABS Take 2 tablets (24 mg) by mouth 2 (two) times daily. 12/10/21     Deutetrabenazine (AUSTEDO) 12 MG TABS Take 24 mg by mouth 2 (two) times daily. 01/21/22     fluticasone (FLONASE) 50 MCG/ACT nasal spray Place 2 sprays into both nostrils daily. 12/11/21   Etta Grandchild, MD  levocetirizine (XYZAL) 5 MG tablet Take 1 tablet (5 mg total) by mouth every evening. 12/11/21   Etta Grandchild, MD  lithium carbonate 300 MG capsule Take 1 capsule (300 mg total) by mouth every evening for 4 days, then 2 capsules (600mg ) every evening for 4 days, then 3 capsules (900mg ) every evening 10/19/21     lithium carbonate 300 MG capsule Take 1 capsule (300 mg total) by mouth every evening for 4 days, THEN 2 capsules (600 mg total) every evening for 4 days, THEN 3 capsules (900 mg total) every evening therafter. 01/21/22     LORazepam (ATIVAN) 0.5 MG tablet Take 1 tablet (0.5 mg total) by mouth 2 (two) times daily. 10/19/21     LORazepam (ATIVAN) 1 MG tablet Take 1 tablet (1 mg total) by mouth 3 (three) times daily. 01/21/22     metFORMIN (GLUCOPHAGE-XR) 750 MG 24 hr tablet Take 2 tablets (1,500 mg total) by mouth daily with breakfast. 10/31/21   01/23/22, MD  Multiple Vitamin (MULTIVITAMIN) tablet Take by mouth daily. Chew 2 per day    [provider]  nebivolol (BYSTOLIC) 5 MG tablet  Take 1 tablet (5 mg total) by mouth daily. 02/07/21   Etta Grandchild, MD  Probiotic Product (PROBIOTIC PO) Take by mouth daily.    [provider]  traZODone (DESYREL) 50 MG tablet take 1/2 - 2 tablet by mouth at night as needed for insomnia 12/04/20     trimethoprim-polymyxin b (POLYTRIM) ophthalmic solution Place 1 drop into both eyes every 4 (four) hours. 01/08/22   Ellsworth Lennox, PA-C      Allergies    Patient has no known allergies.    Review of Systems   Review of Systems  Constitutional:  Negative for chills and fever.  HENT:  Positive for hearing loss and sinus pressure.  Negative for ear discharge, ear pain and sore throat.        Aural fullness  Eyes:  Negative for pain and discharge.  Gastrointestinal:  Negative for diarrhea, nausea and vomiting.  All other systems reviewed and are negative.  Physical Exam Updated Vital Signs BP (!) 146/114   Pulse 84   Temp 98.6 F (37 C)   Resp 18   Ht 5\' 2"  (1.575 m)   Wt 112 kg   SpO2 98%   BMI 45.18 kg/m  Physical Exam Vitals and nursing note reviewed.  Constitutional:      General: She is not in acute distress.    Appearance: She is not toxic-appearing.  HENT:     Head: Normocephalic and atraumatic.     Right Ear: External ear normal. Decreased hearing noted. No drainage, swelling or tenderness. A middle ear effusion is present. There is no impacted cerumen. No mastoid tenderness. No PE tube. No hemotympanum. Tympanic membrane is not injected or perforated.     Left Ear: External ear normal. Decreased hearing noted. No drainage, swelling or tenderness. A middle ear effusion is present. There is no impacted cerumen. No mastoid tenderness. No PE tube. No hemotympanum. Tympanic membrane is not injected or perforated.     Nose: Congestion present.     Mouth/Throat:     Mouth: Mucous membranes are moist.     Pharynx: Oropharynx is clear. No oropharyngeal exudate or posterior oropharyngeal erythema.  Eyes:     General:        Right eye: No discharge.        Left eye: No discharge.     Extraocular Movements: Extraocular movements intact.     Conjunctiva/sclera: Conjunctivae normal.     Pupils: Pupils are equal, round, and reactive to light.  Cardiovascular:     Rate and Rhythm: Normal rate and regular rhythm.  Pulmonary:     Effort: No respiratory distress.     Breath sounds: No wheezing.  Abdominal:     General: Abdomen is flat. Bowel sounds are normal.     Palpations: Abdomen is soft.     Tenderness: There is no abdominal tenderness.  Musculoskeletal:     Cervical back: Normal range of motion and  neck supple. No tenderness.  Skin:    General: Skin is warm and dry.     Capillary Refill: Capillary refill takes less than 2 seconds.     Coloration: Skin is not jaundiced or pale.  Neurological:     Mental Status: She is alert and oriented to person, place, and time.  Psychiatric:        Behavior: Behavior normal.    ED Results / Procedures / Treatments   Labs (all labs ordered are listed, but only abnormal results are displayed) Labs Reviewed -  No data to display  EKG None  Radiology No results found.  Procedures Procedures    Medications Ordered in ED Medications - No data to display  ED Course/ Medical Decision Making/ A&P                           Medical Decision Making  41 year old female presents to the ED.  Please see HPI for further details.  On examination the patient is afebrile, nontachycardic.  The patient lung sounds are clear bilaterally, the patient is not hypoxic on room air.  Patient's abdomen is soft and compressible in all 4 quadrants.  Bilateral ears inspected and found to have bilateral middle ear effusions.  These effusions do not appear supportive.  The patient recently finished prescription of amoxicillin as well as Augmentin over the course of the last 20 days.  Patient stated that she was not utilizing Nettie pot, not utilizing Flonase, not utilizing Sudafed.  I have encouraged the patient to begin utilizing his medications as they will most likely care of her symptoms.  The patient states that she has never been seen by ENT doctor, the patient has a history of eustachian tube dysfunction.  I referred the patient to an ENT doctor for further management and evaluation.  The patient was given return precautions and she voiced understanding of these.  The patient had all of her questions answered to her satisfaction.  The patient is stable at this time for discharge home   Final Clinical Impression(s) / ED Diagnoses Final diagnoses:  Otitis media  with effusion, bilateral    Rx / DC Orders ED Discharge Orders     None         Al DecantGroce, Janeece Blok F, PA-C 01/29/22 1159    Terald Sleeperrifan, Matthew J, MD 01/29/22 1313

## 2022-01-29 NOTE — Telephone Encounter (Signed)
RNCM received inbound call from patient stating she was referred to Dr. Narda Bonds however his office is closed. Patient is requesting referral to a different ENT. Confirmed with EDP that any ENT will be ok. RNCM spoke with [facility] [phone] and faxed AVS for referral to [phone].    RNCM called patient however no answer. Patient can call to schedule her appointment with [facility].  No additional TOC needs.

## 2022-01-30 ENCOUNTER — Ambulatory Visit: Payer: No Typology Code available for payment source | Admitting: Internal Medicine

## 2022-02-04 ENCOUNTER — Other Ambulatory Visit (HOSPITAL_COMMUNITY): Payer: Self-pay

## 2022-02-15 ENCOUNTER — Other Ambulatory Visit (HOSPITAL_COMMUNITY): Payer: Self-pay

## 2022-02-18 ENCOUNTER — Other Ambulatory Visit (HOSPITAL_COMMUNITY): Payer: Self-pay

## 2022-02-19 ENCOUNTER — Other Ambulatory Visit (HOSPITAL_COMMUNITY): Payer: Self-pay

## 2022-02-19 MED ORDER — BOTOX 100 UNITS IJ SOLR
50.0000 [IU] | INTRAMUSCULAR | 4 refills | Status: AC
  Filled 2022-02-19: qty 1, 84d supply, fill #0

## 2022-02-21 ENCOUNTER — Other Ambulatory Visit (HOSPITAL_COMMUNITY): Payer: Self-pay

## 2022-03-01 ENCOUNTER — Other Ambulatory Visit (HOSPITAL_COMMUNITY): Payer: Self-pay

## 2022-03-04 ENCOUNTER — Other Ambulatory Visit (HOSPITAL_COMMUNITY): Payer: Self-pay

## 2022-03-08 ENCOUNTER — Other Ambulatory Visit (HOSPITAL_COMMUNITY): Payer: Self-pay

## 2022-03-11 ENCOUNTER — Other Ambulatory Visit (HOSPITAL_COMMUNITY): Payer: Self-pay

## 2022-03-11 MED ORDER — LITHIUM CARBONATE 300 MG PO CAPS
ORAL_CAPSULE | ORAL | 3 refills | Status: DC
  Filled 2022-03-11: qty 90, 123d supply, fill #0
  Filled 2022-03-26: qty 90, 30d supply, fill #0
  Filled 2022-03-26: qty 90, 12d supply, fill #0
  Filled 2022-05-13: qty 90, 30d supply, fill #1
  Filled 2022-07-11: qty 90, 30d supply, fill #2

## 2022-03-11 MED ORDER — AUSTEDO 12 MG PO TABS
2.0000 | ORAL_TABLET | Freq: Two times a day (BID) | ORAL | 3 refills | Status: DC
  Filled 2022-03-11 – 2022-04-02 (×3): qty 120, 30d supply, fill #0

## 2022-03-11 MED ORDER — LORAZEPAM 1 MG PO TABS
1.0000 mg | ORAL_TABLET | Freq: Three times a day (TID) | ORAL | 3 refills | Status: DC
  Filled 2022-03-11 – 2022-05-13 (×2): qty 90, 30d supply, fill #0
  Filled 2022-06-12: qty 90, 30d supply, fill #1
  Filled 2022-07-11: qty 90, 30d supply, fill #2

## 2022-03-13 ENCOUNTER — Other Ambulatory Visit (HOSPITAL_COMMUNITY): Payer: Self-pay

## 2022-03-26 ENCOUNTER — Other Ambulatory Visit (HOSPITAL_COMMUNITY): Payer: Self-pay

## 2022-04-01 DIAGNOSIS — F419 Anxiety disorder, unspecified: Secondary | ICD-10-CM | POA: Diagnosis not present

## 2022-04-01 DIAGNOSIS — R69 Illness, unspecified: Secondary | ICD-10-CM | POA: Diagnosis not present

## 2022-04-02 ENCOUNTER — Other Ambulatory Visit (HOSPITAL_COMMUNITY): Payer: Self-pay

## 2022-04-10 ENCOUNTER — Other Ambulatory Visit (HOSPITAL_COMMUNITY): Payer: Self-pay

## 2022-04-10 DIAGNOSIS — H6523 Chronic serous otitis media, bilateral: Secondary | ICD-10-CM | POA: Insufficient documentation

## 2022-04-10 DIAGNOSIS — H900 Conductive hearing loss, bilateral: Secondary | ICD-10-CM | POA: Diagnosis not present

## 2022-04-10 DIAGNOSIS — H6983 Other specified disorders of Eustachian tube, bilateral: Secondary | ICD-10-CM | POA: Diagnosis not present

## 2022-04-15 ENCOUNTER — Other Ambulatory Visit (HOSPITAL_COMMUNITY): Payer: Self-pay

## 2022-04-15 ENCOUNTER — Encounter: Payer: Self-pay | Admitting: Internal Medicine

## 2022-04-15 ENCOUNTER — Ambulatory Visit (INDEPENDENT_AMBULATORY_CARE_PROVIDER_SITE_OTHER): Payer: 59 | Admitting: Internal Medicine

## 2022-04-15 VITALS — BP 134/84 | HR 95 | Temp 98.2°F | Resp 16 | Ht 62.0 in | Wt 245.2 lb

## 2022-04-15 DIAGNOSIS — E538 Deficiency of other specified B group vitamins: Secondary | ICD-10-CM

## 2022-04-15 DIAGNOSIS — E118 Type 2 diabetes mellitus with unspecified complications: Secondary | ICD-10-CM

## 2022-04-15 DIAGNOSIS — Z0000 Encounter for general adult medical examination without abnormal findings: Secondary | ICD-10-CM

## 2022-04-15 DIAGNOSIS — E785 Hyperlipidemia, unspecified: Secondary | ICD-10-CM

## 2022-04-15 DIAGNOSIS — I10 Essential (primary) hypertension: Secondary | ICD-10-CM

## 2022-04-15 DIAGNOSIS — E1169 Type 2 diabetes mellitus with other specified complication: Secondary | ICD-10-CM | POA: Diagnosis not present

## 2022-04-15 DIAGNOSIS — D539 Nutritional anemia, unspecified: Secondary | ICD-10-CM

## 2022-04-15 DIAGNOSIS — Z1231 Encounter for screening mammogram for malignant neoplasm of breast: Secondary | ICD-10-CM

## 2022-04-15 DIAGNOSIS — D509 Iron deficiency anemia, unspecified: Secondary | ICD-10-CM | POA: Diagnosis not present

## 2022-04-15 LAB — CBC WITH DIFFERENTIAL/PLATELET
Basophils Absolute: 0 10*3/uL (ref 0.0–0.1)
Basophils Relative: 0.4 % (ref 0.0–3.0)
Eosinophils Absolute: 0.3 10*3/uL (ref 0.0–0.7)
Eosinophils Relative: 3.2 % (ref 0.0–5.0)
HCT: 35 % — ABNORMAL LOW (ref 36.0–46.0)
Hemoglobin: 11.4 g/dL — ABNORMAL LOW (ref 12.0–15.0)
Lymphocytes Relative: 31 % (ref 12.0–46.0)
Lymphs Abs: 2.4 10*3/uL (ref 0.7–4.0)
MCHC: 32.6 g/dL (ref 30.0–36.0)
MCV: 74.4 fl — ABNORMAL LOW (ref 78.0–100.0)
Monocytes Absolute: 0.7 10*3/uL (ref 0.1–1.0)
Monocytes Relative: 8.6 % (ref 3.0–12.0)
Neutro Abs: 4.5 10*3/uL (ref 1.4–7.7)
Neutrophils Relative %: 56.8 % (ref 43.0–77.0)
Platelets: 306 10*3/uL (ref 150.0–400.0)
RBC: 4.7 Mil/uL (ref 3.87–5.11)
RDW: 15.6 % — ABNORMAL HIGH (ref 11.5–15.5)
WBC: 7.9 10*3/uL (ref 4.0–10.5)

## 2022-04-15 LAB — BASIC METABOLIC PANEL
BUN: 4 mg/dL — ABNORMAL LOW (ref 6–23)
CO2: 29 mEq/L (ref 19–32)
Calcium: 9 mg/dL (ref 8.4–10.5)
Chloride: 99 mEq/L (ref 96–112)
Creatinine, Ser: 0.54 mg/dL (ref 0.40–1.20)
GFR: 112.96 mL/min (ref >60.00)
Glucose, Bld: 251 mg/dL — ABNORMAL HIGH (ref 70–99)
Potassium: 3.7 mEq/L (ref 3.5–5.1)
Sodium: 133 mEq/L — ABNORMAL LOW (ref 135–145)

## 2022-04-15 LAB — URINALYSIS, ROUTINE W REFLEX MICROSCOPIC
Bilirubin Urine: NEGATIVE
Hgb urine dipstick: NEGATIVE
Ketones, ur: NEGATIVE
Leukocytes,Ua: NEGATIVE
Nitrite: NEGATIVE
RBC / HPF: NONE SEEN (ref 0–?)
Specific Gravity, Urine: 1.015 (ref 1.000–1.030)
Total Protein, Urine: NEGATIVE
Urine Glucose: NEGATIVE
Urobilinogen, UA: 0.2 (ref 0.0–1.0)
pH: 6 (ref 5.0–8.0)

## 2022-04-15 LAB — HCG, QUANTITATIVE, PREGNANCY: Quantitative HCG: 0.69 m[IU]/mL

## 2022-04-15 LAB — LIPID PANEL
Cholesterol: 194 mg/dL (ref 0–200)
HDL: 29.7 mg/dL — ABNORMAL LOW (ref >39.00)
NonHDL: 164
Total CHOL/HDL Ratio: 7
Triglycerides: 337 mg/dL — ABNORMAL HIGH (ref 0.0–149.0)
VLDL: 67.4 mg/dL — ABNORMAL HIGH (ref 0.0–40.0)

## 2022-04-15 LAB — VITAMIN B12: Vitamin B-12: 1192 pg/mL — ABNORMAL HIGH (ref 211–911)

## 2022-04-15 LAB — IBC + FERRITIN
Ferritin: 37.8 ng/mL (ref 10.0–291.0)
Iron: 40 ug/dL — ABNORMAL LOW (ref 42–145)
Saturation Ratios: 9 % — ABNORMAL LOW (ref 20.0–50.0)
TIBC: 443.8 ug/dL (ref 250.0–450.0)
Transferrin: 317 mg/dL (ref 212.0–360.0)

## 2022-04-15 LAB — HEMOGLOBIN A1C: Hgb A1c MFr Bld: 8 % — ABNORMAL HIGH (ref 4.6–6.5)

## 2022-04-15 LAB — MICROALBUMIN / CREATININE URINE RATIO
Creatinine,U: 96.8 mg/dL
Microalb Creat Ratio: 0.8 mg/g (ref 0.0–30.0)
Microalb, Ur: 0.8 mg/dL (ref 0.0–1.9)

## 2022-04-15 LAB — FOLATE: Folate: 23.8 ng/mL (ref >5.9)

## 2022-04-15 LAB — LDL CHOLESTEROL, DIRECT: Direct LDL: 77 mg/dL

## 2022-04-15 MED ORDER — TIRZEPATIDE 2.5 MG/0.5ML ~~LOC~~ SOAJ
2.5000 mg | SUBCUTANEOUS | 0 refills | Status: DC
  Filled 2022-04-15: qty 2, 28d supply, fill #0

## 2022-04-15 MED ORDER — METFORMIN HCL ER 750 MG PO TB24
1500.0000 mg | ORAL_TABLET | Freq: Every day | ORAL | 1 refills | Status: DC
  Filled 2022-04-15: qty 60, 30d supply, fill #0
  Filled 2022-05-29: qty 60, 30d supply, fill #1
  Filled 2022-07-11: qty 60, 30d supply, fill #2
  Filled 2022-08-16: qty 60, 30d supply, fill #3
  Filled 2022-09-30: qty 60, 30d supply, fill #4
  Filled 2022-11-20: qty 60, 30d supply, fill #5

## 2022-04-15 MED ORDER — NEBIVOLOL HCL 5 MG PO TABS
5.0000 mg | ORAL_TABLET | Freq: Every day | ORAL | 1 refills | Status: DC
  Filled 2022-04-15: qty 30, 30d supply, fill #0
  Filled 2022-05-20: qty 30, 30d supply, fill #1
  Filled 2022-07-11: qty 30, 30d supply, fill #2
  Filled 2022-08-13: qty 30, 30d supply, fill #3
  Filled 2022-09-18: qty 30, 30d supply, fill #4
  Filled 2022-10-16: qty 30, 30d supply, fill #5

## 2022-04-15 NOTE — Progress Notes (Signed)
Subjective:  Patient ID: Kaylee Hill, female    DOB: 04/25/1981  Age: 41 y.o. MRN: [id number]  CC: Hypertension, Annual Exam, Diabetes, and Hyperlipidemia   HPI Kaylee Hill presents for a CPX and f/up -   She complains of weight gain and said her blood sugar is not well controlled.  She has not been exercising.  She denies chest pain, shortness of breath, dizziness, or lightheadedness.  She has not been taking nebivolol and feels like her blood pressure and pulse have been elevated.  Outpatient Medications Prior to Visit  Medication Sig Dispense Refill   amoxicillin (AMOXIL) 500 MG capsule Take 1 capsule (500 mg total) by mouth 3 (three) times daily. 21 capsule 0   botulinum toxin Type A (BOTOX) 100 units SOLR injection Inject 50 -100 units every 3 months 1 each 4   Deutetrabenazine (AUSTEDO) 12 MG TABS Take 2 tablets by mouth 2 (two) times daily. 120 tablet 3   Deutetrabenazine (AUSTEDO) 12 MG TABS Take 2 tablets (24 mg) by mouth 2 (two) times daily. 120 tablet 3   Deutetrabenazine (AUSTEDO) 12 MG TABS Take 24 mg by mouth 2 (two) times daily. 120 tablet 3   Deutetrabenazine (AUSTEDO) 12 MG TABS Take 2 tablets (24mg ) by mouth 2 (two) times daily. 120 tablet 3   fluticasone (FLONASE) 50 MCG/ACT nasal spray Place 2 sprays into both nostrils daily. 48 g 1   levocetirizine (XYZAL) 5 MG tablet Take 1 tablet (5 mg total) by mouth every evening. 90 tablet 1   lithium carbonate 300 MG capsule Take 1 capsule (300 mg total) by mouth every evening for 4 days, THEN 2 capsules (600 mg total) every evening for 4 days, THEN 3 capsules (900 mg total) every evening. 90 capsule 3   LORazepam (ATIVAN) 1 MG tablet Take 1 tablet (1 mg total) by mouth 3 (three) times daily. 90 tablet 3   Multiple Vitamin (MULTIVITAMIN) tablet Take by mouth daily. Chew 2 per day     Probiotic Product (PROBIOTIC PO) Take by mouth daily.     traZODone (DESYREL) 50 MG tablet take 1/2 - 2 tablet by mouth at night as needed  for insomnia 180 tablet 1   lithium carbonate 300 MG capsule Take 1 capsule (300 mg total) by mouth every evening for 4 days, then 2 capsules (600mg ) every evening for 4 days, then 3 capsules (900mg ) every evening 90 capsule 3   lithium carbonate 300 MG capsule Take 1 capsule (300 mg total) by mouth every evening for 4 days, THEN 2 capsules (600 mg total) every evening for 4 days, THEN 3 capsules (900 mg total) every evening therafter. 90 capsule 3   LORazepam (ATIVAN) 0.5 MG tablet Take 1 tablet (0.5 mg total) by mouth 2 (two) times daily. 60 tablet 3   LORazepam (ATIVAN) 1 MG tablet Take 1 tablet (1 mg total) by mouth 3 (three) times daily. 90 tablet 3   metFORMIN (GLUCOPHAGE-XR) 750 MG 24 hr tablet Take 2 tablets (1,500 mg total) by mouth daily with breakfast. 180 tablet 1   nebivolol (BYSTOLIC) 5 MG tablet Take 1 tablet (5 mg total) by mouth daily. 90 tablet 1   trimethoprim-polymyxin b (POLYTRIM) ophthalmic solution Place 1 drop into both eyes every 4 (four) hours. 10 mL 0   No facility-administered medications prior to visit.    ROS Review of Systems  Constitutional:  Positive for unexpected weight change. Negative for diaphoresis and fatigue.  HENT: Negative.  Eyes: Negative.   Respiratory:  Negative for cough, chest tightness, shortness of breath and wheezing.   Cardiovascular:  Negative for chest pain, palpitations and leg swelling.  Gastrointestinal:  Negative for abdominal pain, diarrhea, nausea and vomiting.  Endocrine: Negative.   Genitourinary: Negative.  Negative for difficulty urinating.  Musculoskeletal: Negative.   Skin: Negative.   Neurological:  Negative for dizziness, weakness, light-headedness and headaches.  Hematological:  Negative for adenopathy. Does not bruise/bleed easily.  Psychiatric/Behavioral: Negative.      Objective:  BP 134/84 (BP Location: Right Arm, Patient Position: Sitting, Cuff Size: Large)   Pulse 95   Temp 98.2 F (36.8 C) (Oral)   Resp 16    Ht 5\' 2"  (1.575 m)   Wt 245 lb 4 oz (111.2 kg)   LMP 04/13/2022 (Exact Date)   SpO2 97%   BMI 44.86 kg/m   BP Readings from Last 3 Encounters:  04/15/22 134/84  01/29/22 (!) 146/114  01/08/22 (!) 183/93    Wt Readings from Last 3 Encounters:  04/15/22 245 lb 4 oz (111.2 kg)  01/29/22 247 lb (112 kg)  12/11/21 240 lb (108.9 kg)    Physical Exam Vitals reviewed.  Constitutional:      Appearance: She is obese.  HENT:     Nose: Nose normal.     Mouth/Throat:     Mouth: Mucous membranes are moist.  Eyes:     General: No scleral icterus.    Conjunctiva/sclera: Conjunctivae normal.  Cardiovascular:     Rate and Rhythm: Normal rate and regular rhythm.     Pulses: Normal pulses.     Heart sounds: No murmur heard.    No gallop.  Pulmonary:     Effort: Pulmonary effort is normal.     Breath sounds: No stridor. No wheezing, rhonchi or rales.  Abdominal:     General: Abdomen is flat.     Palpations: There is no mass.     Tenderness: There is no abdominal tenderness. There is no guarding.     Hernia: No hernia is present.  Musculoskeletal:        General: Normal range of motion.     Cervical back: Neck supple.     Right lower leg: No edema.  Lymphadenopathy:     Cervical: No cervical adenopathy.  Skin:    General: Skin is warm and dry.  Neurological:     General: No focal deficit present.     Mental Status: She is alert.  Psychiatric:        Mood and Affect: Mood normal.        Behavior: Behavior normal.     Lab Results  Component Value Date   WBC 7.9 04/15/2022   HGB 11.4 (L) 04/15/2022   HCT 35.0 (L) 04/15/2022   PLT 306.0 04/15/2022   GLUCOSE 251 (H) 04/15/2022   CHOL 194 04/15/2022   TRIG 337.0 (H) 04/15/2022   HDL 29.70 (L) 04/15/2022   LDLDIRECT 77.0 04/15/2022   LDLCALC 121 (H) 04/17/2020   ALT 14 10/23/2021   AST 22 10/23/2021   NA 133 (L) 04/15/2022   K 3.7 04/15/2022   CL 99 04/15/2022   CREATININE 0.54 04/15/2022   BUN 4 (L) 04/15/2022    CO2 29 04/15/2022   TSH 1.890 10/23/2021   HGBA1C 8.0 (H) 04/15/2022   MICROALBUR 0.8 04/15/2022    No results found.  Assessment & Plan:   Kaylee Hill was seen today for hypertension, annual exam, diabetes and hyperlipidemia.  Diagnoses and all orders for this visit:  Benign essential hypertension- Her blood pressure is not adequately well controlled. -     Basic metabolic panel; Future -     Urinalysis, Routine w reflex microscopic; Future -     hCG, quantitative, pregnancy; Future -     hCG, quantitative, pregnancy -     Urinalysis, Routine w reflex microscopic -     Basic metabolic panel -     nebivolol (BYSTOLIC) 5 MG tablet; Take 1 tablet (5 mg total) by mouth daily.  Hyperlipidemia associated with type 2 diabetes mellitus (HCC) -     Lipid panel; Future -     hCG, quantitative, pregnancy; Future -     hCG, quantitative, pregnancy -     Lipid panel  Type II diabetes mellitus with manifestations (HCC)- Her blood sugar is not adequately well controlled.  Will add a GLP-1 agonist.  She agrees to use condoms. -     Hemoglobin A1c; Future -     Microalbumin / creatinine urine ratio; Future -     hCG, quantitative, pregnancy; Future -     HM Diabetes Foot Exam -     hCG, quantitative, pregnancy -     Microalbumin / creatinine urine ratio -     Hemoglobin A1c -     metFORMIN (GLUCOPHAGE-XR) 750 MG 24 hr tablet; Take 2 tablets (1,500 mg total) by mouth daily with breakfast. -     Discontinue: tirzepatide (MOUNJARO) 2.5 MG/0.5ML Pen; Inject 2.5 mg into the skin once a week. -     Dulaglutide (TRULICITY) 0.75 MG/0.5ML SOPN; Inject 0.75 mg into the skin once a week.  Iron deficiency anemia, unspecified iron deficiency anemia type -     IBC + Ferritin; Future -     CBC with Differential/Platelet; Future -     Vitamin B12; Future -     hCG, quantitative, pregnancy; Future -     hCG, quantitative, pregnancy -     Vitamin B12 -     CBC with Differential/Platelet -     IBC +  Ferritin  B12 deficiency -     CBC with Differential/Platelet; Future -     Vitamin B12; Future -     Folate; Future -     hCG, quantitative, pregnancy; Future -     hCG, quantitative, pregnancy -     Folate -     Vitamin B12 -     CBC with Differential/Platelet  Routine general medical examination at a health care facility- Exam completed, labs reviewed, vaccines reviewed and updated, cancer screenings are up dressed, patient education was given.  Visit for screening mammogram -     MM DIGITAL SCREENING BILATERAL; Future  Deficiency anemia-we will evaluate for vitamin deficiencies. -     Vitamin B1 -     Zinc  Other orders -     LDL cholesterol, direct   I have discontinued Mariza D. Pangelinan's trimethoprim-polymyxin b and tirzepatide. I am also having her start on Trulicity. Additionally, I am having her maintain her multivitamin, traZODone, Austedo, Probiotic Product (PROBIOTIC PO), Austedo, levocetirizine, fluticasone, amoxicillin, [place]edo, Botox, LORazepam, lithium carbonate, Austedo, metFORMIN, and nebivolol.  Meds ordered this encounter  Medications   metFORMIN (GLUCOPHAGE-XR) 750 MG 24 hr tablet    Sig: Take 2 tablets (1,500 mg total) by mouth daily with breakfast.    Dispense:  180 tablet    Refill:  1   nebivolol (BYSTOLIC) 5 MG tablet  Sig: Take 1 tablet (5 mg total) by mouth daily.    Dispense:  90 tablet    Refill:  1   DISCONTD: tirzepatide (MOUNJARO) 2.5 MG/0.5ML Pen    Sig: Inject 2.5 mg into the skin once a week.    Dispense:  2 mL    Refill:  0   Dulaglutide (TRULICITY) [id number] SOPN    Sig: Inject 0.75 mg into the skin once a week.    Dispense:  2 mL    Refill:  0     Follow-up: Return in about 6 months (around 10/16/2022).  Scarlette Calico, MD

## 2022-04-16 ENCOUNTER — Other Ambulatory Visit (HOSPITAL_COMMUNITY): Payer: Self-pay

## 2022-04-16 DIAGNOSIS — H900 Conductive hearing loss, bilateral: Secondary | ICD-10-CM | POA: Diagnosis not present

## 2022-04-16 DIAGNOSIS — H65493 Other chronic nonsuppurative otitis media, bilateral: Secondary | ICD-10-CM | POA: Diagnosis not present

## 2022-04-17 ENCOUNTER — Telehealth: Payer: Self-pay | Admitting: Internal Medicine

## 2022-04-17 ENCOUNTER — Other Ambulatory Visit (HOSPITAL_COMMUNITY): Payer: Self-pay

## 2022-04-17 NOTE — Telephone Encounter (Signed)
Patient states that Greggory Keen was called in and it is too expensive.  Her insurance company told her that trulicity is covered by her insurance.

## 2022-04-18 ENCOUNTER — Other Ambulatory Visit (HOSPITAL_COMMUNITY): Payer: Self-pay

## 2022-04-18 MED ORDER — TRULICITY 0.75 MG/0.5ML ~~LOC~~ SOAJ
0.7500 mg | SUBCUTANEOUS | 0 refills | Status: DC
  Filled 2022-04-18: qty 2, 28d supply, fill #0

## 2022-04-19 ENCOUNTER — Encounter: Payer: Self-pay | Admitting: Internal Medicine

## 2022-04-19 ENCOUNTER — Other Ambulatory Visit (HOSPITAL_COMMUNITY): Payer: Self-pay

## 2022-04-19 DIAGNOSIS — F419 Anxiety disorder, unspecified: Secondary | ICD-10-CM | POA: Diagnosis not present

## 2022-04-19 DIAGNOSIS — R69 Illness, unspecified: Secondary | ICD-10-CM | POA: Diagnosis not present

## 2022-04-19 LAB — VITAMIN B1: Vitamin B1 (Thiamine): 9 nmol/L (ref 8–30)

## 2022-04-19 LAB — ZINC: Zinc: 55 ug/dL — ABNORMAL LOW (ref 60–130)

## 2022-04-20 ENCOUNTER — Other Ambulatory Visit: Payer: Self-pay | Admitting: Internal Medicine

## 2022-04-20 DIAGNOSIS — D500 Iron deficiency anemia secondary to blood loss (chronic): Secondary | ICD-10-CM

## 2022-04-20 DIAGNOSIS — D538 Other specified nutritional anemias: Secondary | ICD-10-CM

## 2022-04-20 DIAGNOSIS — E60 Dietary zinc deficiency: Secondary | ICD-10-CM | POA: Insufficient documentation

## 2022-04-20 MED ORDER — ZINC GLUCONATE 50 MG PO TABS
50.0000 mg | ORAL_TABLET | Freq: Every day | ORAL | 1 refills | Status: DC
  Filled 2022-04-20: qty 90, 90d supply, fill #0

## 2022-04-20 MED ORDER — ACCRUFER 30 MG PO CAPS: 1.0000 | ORAL_CAPSULE | Freq: Two times a day (BID) | ORAL | 1 refills | Status: AC

## 2022-04-22 ENCOUNTER — Other Ambulatory Visit (HOSPITAL_COMMUNITY): Payer: Self-pay

## 2022-05-01 DIAGNOSIS — F419 Anxiety disorder, unspecified: Secondary | ICD-10-CM | POA: Diagnosis not present

## 2022-05-01 DIAGNOSIS — R69 Illness, unspecified: Secondary | ICD-10-CM | POA: Diagnosis not present

## 2022-05-02 ENCOUNTER — Ambulatory Visit
Admission: RE | Admit: 2022-05-02 | Discharge: 2022-05-02 | Disposition: A | Discharge status: Home / Self Care | Payer: 59 | Source: Ambulatory Visit | Attending: Internal Medicine | Admitting: Internal Medicine

## 2022-05-02 DIAGNOSIS — Z1231 Encounter for screening mammogram for malignant neoplasm of breast: Secondary | ICD-10-CM

## 2022-05-11 ENCOUNTER — Other Ambulatory Visit (HOSPITAL_COMMUNITY): Payer: Self-pay

## 2022-05-13 ENCOUNTER — Other Ambulatory Visit: Payer: Self-pay | Admitting: Internal Medicine

## 2022-05-13 ENCOUNTER — Other Ambulatory Visit (HOSPITAL_COMMUNITY): Payer: Self-pay

## 2022-05-13 DIAGNOSIS — E118 Type 2 diabetes mellitus with unspecified complications: Secondary | ICD-10-CM

## 2022-05-14 ENCOUNTER — Other Ambulatory Visit: Payer: Self-pay | Admitting: Internal Medicine

## 2022-05-14 DIAGNOSIS — E118 Type 2 diabetes mellitus with unspecified complications: Secondary | ICD-10-CM

## 2022-05-14 MED ORDER — TRULICITY 1.5 MG/0.5ML ~~LOC~~ SOAJ: 1.5000 mg | SUBCUTANEOUS | 0 refills | Status: DC

## 2022-05-15 DIAGNOSIS — R69 Illness, unspecified: Secondary | ICD-10-CM | POA: Diagnosis not present

## 2022-05-15 DIAGNOSIS — F419 Anxiety disorder, unspecified: Secondary | ICD-10-CM | POA: Diagnosis not present

## 2022-05-20 ENCOUNTER — Other Ambulatory Visit (HOSPITAL_COMMUNITY): Payer: Self-pay

## 2022-05-22 ENCOUNTER — Other Ambulatory Visit (HOSPITAL_COMMUNITY): Payer: Self-pay

## 2022-05-23 ENCOUNTER — Other Ambulatory Visit (HOSPITAL_COMMUNITY): Payer: Self-pay

## 2022-05-23 MED ORDER — INGREZZA 40 & 80 MG PO CPPK
1.0000 | ORAL_CAPSULE | Freq: Every day | ORAL | 0 refills | Status: DC
  Filled 2022-05-23: qty 28, 28d supply, fill #0

## 2022-05-24 ENCOUNTER — Other Ambulatory Visit (HOSPITAL_COMMUNITY): Payer: Self-pay

## 2022-05-28 ENCOUNTER — Other Ambulatory Visit (HOSPITAL_COMMUNITY): Payer: Self-pay

## 2022-05-29 ENCOUNTER — Other Ambulatory Visit (HOSPITAL_COMMUNITY): Payer: Self-pay

## 2022-05-29 DIAGNOSIS — R69 Illness, unspecified: Secondary | ICD-10-CM | POA: Diagnosis not present

## 2022-05-29 DIAGNOSIS — F419 Anxiety disorder, unspecified: Secondary | ICD-10-CM | POA: Diagnosis not present

## 2022-05-31 ENCOUNTER — Other Ambulatory Visit (HOSPITAL_COMMUNITY): Payer: Self-pay

## 2022-06-06 DIAGNOSIS — R2689 Other abnormalities of gait and mobility: Secondary | ICD-10-CM | POA: Diagnosis not present

## 2022-06-06 DIAGNOSIS — D509 Iron deficiency anemia, unspecified: Secondary | ICD-10-CM | POA: Diagnosis not present

## 2022-06-06 DIAGNOSIS — Z604 Social exclusion and rejection: Secondary | ICD-10-CM | POA: Diagnosis not present

## 2022-06-06 DIAGNOSIS — E1142 Type 2 diabetes mellitus with diabetic polyneuropathy: Secondary | ICD-10-CM | POA: Diagnosis not present

## 2022-06-06 DIAGNOSIS — G5139 Clonic hemifacial spasm, unspecified: Secondary | ICD-10-CM | POA: Diagnosis not present

## 2022-06-06 DIAGNOSIS — I10 Essential (primary) hypertension: Secondary | ICD-10-CM | POA: Diagnosis not present

## 2022-06-06 DIAGNOSIS — G4730 Sleep apnea, unspecified: Secondary | ICD-10-CM | POA: Diagnosis not present

## 2022-06-06 DIAGNOSIS — M1990 Unspecified osteoarthritis, unspecified site: Secondary | ICD-10-CM | POA: Diagnosis not present

## 2022-06-06 DIAGNOSIS — F419 Anxiety disorder, unspecified: Secondary | ICD-10-CM | POA: Diagnosis not present

## 2022-06-06 DIAGNOSIS — F259 Schizoaffective disorder, unspecified: Secondary | ICD-10-CM | POA: Diagnosis not present

## 2022-06-06 DIAGNOSIS — R69 Illness, unspecified: Secondary | ICD-10-CM | POA: Diagnosis not present

## 2022-06-12 ENCOUNTER — Other Ambulatory Visit (HOSPITAL_COMMUNITY): Payer: Self-pay

## 2022-06-12 DIAGNOSIS — Z79899 Other long term (current) drug therapy: Secondary | ICD-10-CM | POA: Diagnosis not present

## 2022-06-12 DIAGNOSIS — Z5181 Encounter for therapeutic drug level monitoring: Secondary | ICD-10-CM | POA: Diagnosis not present

## 2022-06-12 DIAGNOSIS — R69 Illness, unspecified: Secondary | ICD-10-CM | POA: Diagnosis not present

## 2022-06-12 MED ORDER — QUETIAPINE FUMARATE 100 MG PO TABS
ORAL_TABLET | ORAL | 3 refills | Status: DC
  Filled 2022-06-12: qty 60, 30d supply, fill #0
  Filled 2022-07-22: qty 60, 30d supply, fill #1
  Filled 2022-08-27: qty 60, 30d supply, fill #2
  Filled 2022-09-30: qty 60, 30d supply, fill #3

## 2022-06-17 DIAGNOSIS — H6993 Unspecified Eustachian tube disorder, bilateral: Secondary | ICD-10-CM | POA: Diagnosis not present

## 2022-06-17 DIAGNOSIS — H900 Conductive hearing loss, bilateral: Secondary | ICD-10-CM | POA: Diagnosis not present

## 2022-06-17 DIAGNOSIS — H6523 Chronic serous otitis media, bilateral: Secondary | ICD-10-CM | POA: Diagnosis not present

## 2022-06-19 DIAGNOSIS — F419 Anxiety disorder, unspecified: Secondary | ICD-10-CM | POA: Diagnosis not present

## 2022-06-19 DIAGNOSIS — R69 Illness, unspecified: Secondary | ICD-10-CM | POA: Diagnosis not present

## 2022-06-26 ENCOUNTER — Other Ambulatory Visit (HOSPITAL_COMMUNITY): Payer: Self-pay

## 2022-07-01 DIAGNOSIS — F419 Anxiety disorder, unspecified: Secondary | ICD-10-CM | POA: Diagnosis not present

## 2022-07-01 DIAGNOSIS — R69 Illness, unspecified: Secondary | ICD-10-CM | POA: Diagnosis not present

## 2022-07-11 ENCOUNTER — Other Ambulatory Visit (HOSPITAL_COMMUNITY): Payer: Self-pay

## 2022-07-15 DIAGNOSIS — F419 Anxiety disorder, unspecified: Secondary | ICD-10-CM | POA: Diagnosis not present

## 2022-07-15 DIAGNOSIS — R69 Illness, unspecified: Secondary | ICD-10-CM | POA: Diagnosis not present

## 2022-07-22 ENCOUNTER — Other Ambulatory Visit (HOSPITAL_COMMUNITY): Payer: Self-pay

## 2022-07-22 DIAGNOSIS — R69 Illness, unspecified: Secondary | ICD-10-CM | POA: Diagnosis not present

## 2022-07-22 MED ORDER — LORAZEPAM 1 MG PO TABS
ORAL_TABLET | ORAL | 3 refills | Status: DC
  Filled 2022-07-22: qty 75, 30d supply, fill #0
  Filled 2022-08-16: qty 57, 22d supply, fill #0
  Filled 2022-08-16: qty 75, 30d supply, fill #1
  Filled 2022-08-16: qty 18, 8d supply, fill #0
  Filled 2022-09-18: qty 75, 30d supply, fill #2
  Filled 2022-10-23: qty 75, 30d supply, fill #3
  Filled 2022-11-25: qty 75, 30d supply, fill #4

## 2022-07-22 MED ORDER — LITHIUM CARBONATE 300 MG PO CAPS
900.0000 mg | ORAL_CAPSULE | Freq: Every evening | ORAL | 3 refills | Status: DC
  Filled 2022-07-22 – 2022-08-16 (×2): qty 90, 30d supply, fill #0
  Filled 2022-10-16: qty 90, 30d supply, fill #1
  Filled 2022-11-23: qty 90, 30d supply, fill #2

## 2022-07-29 DIAGNOSIS — F419 Anxiety disorder, unspecified: Secondary | ICD-10-CM | POA: Diagnosis not present

## 2022-07-29 DIAGNOSIS — R69 Illness, unspecified: Secondary | ICD-10-CM | POA: Diagnosis not present

## 2022-07-31 DIAGNOSIS — G245 Blepharospasm: Secondary | ICD-10-CM | POA: Diagnosis not present

## 2022-07-31 DIAGNOSIS — R69 Illness, unspecified: Secondary | ICD-10-CM | POA: Diagnosis not present

## 2022-08-13 ENCOUNTER — Other Ambulatory Visit (HOSPITAL_COMMUNITY): Payer: Self-pay

## 2022-08-16 ENCOUNTER — Other Ambulatory Visit: Payer: Self-pay | Admitting: Internal Medicine

## 2022-08-16 ENCOUNTER — Other Ambulatory Visit (HOSPITAL_COMMUNITY): Payer: Self-pay

## 2022-08-16 DIAGNOSIS — E118 Type 2 diabetes mellitus with unspecified complications: Secondary | ICD-10-CM

## 2022-08-16 MED ORDER — TRULICITY 1.5 MG/0.5ML ~~LOC~~ SOAJ: 1.5000 mg | SUBCUTANEOUS | 0 refills | Status: DC

## 2022-08-19 ENCOUNTER — Encounter: Payer: Self-pay | Admitting: Internal Medicine

## 2022-08-19 ENCOUNTER — Other Ambulatory Visit: Payer: Self-pay | Admitting: Internal Medicine

## 2022-08-19 ENCOUNTER — Other Ambulatory Visit (HOSPITAL_COMMUNITY): Payer: Self-pay

## 2022-08-19 DIAGNOSIS — E118 Type 2 diabetes mellitus with unspecified complications: Secondary | ICD-10-CM

## 2022-08-19 MED ORDER — DEXCOM G7 SENSOR MISC
1.0000 | Freq: Every day | 1 refills | Status: AC
  Filled 2022-08-19: qty 3, 28d supply, fill #0
  Filled 2023-04-08 – 2023-04-16 (×4): qty 3, 28d supply, fill #1

## 2022-08-19 MED ORDER — DEXCOM G7 RECEIVER DEVI
1.0000 | Freq: Every day | 1 refills | Status: AC
  Filled 2022-08-19: qty 1, 30d supply, fill #0

## 2022-08-20 ENCOUNTER — Other Ambulatory Visit: Payer: Self-pay

## 2022-08-20 ENCOUNTER — Other Ambulatory Visit (HOSPITAL_COMMUNITY): Payer: Self-pay

## 2022-08-20 DIAGNOSIS — Z0271 Encounter for disability determination: Secondary | ICD-10-CM

## 2022-08-22 DIAGNOSIS — G245 Blepharospasm: Secondary | ICD-10-CM | POA: Diagnosis not present

## 2022-08-23 DIAGNOSIS — R69 Illness, unspecified: Secondary | ICD-10-CM | POA: Diagnosis not present

## 2022-08-23 DIAGNOSIS — F419 Anxiety disorder, unspecified: Secondary | ICD-10-CM | POA: Diagnosis not present

## 2022-08-27 ENCOUNTER — Other Ambulatory Visit (HOSPITAL_COMMUNITY): Payer: Self-pay

## 2022-09-09 DIAGNOSIS — F419 Anxiety disorder, unspecified: Secondary | ICD-10-CM | POA: Diagnosis not present

## 2022-09-09 DIAGNOSIS — R69 Illness, unspecified: Secondary | ICD-10-CM | POA: Diagnosis not present

## 2022-09-10 DIAGNOSIS — F419 Anxiety disorder, unspecified: Secondary | ICD-10-CM | POA: Diagnosis not present

## 2022-09-10 DIAGNOSIS — F3181 Bipolar II disorder: Secondary | ICD-10-CM | POA: Diagnosis not present

## 2022-09-15 ENCOUNTER — Ambulatory Visit (INDEPENDENT_AMBULATORY_CARE_PROVIDER_SITE_OTHER): Payer: 59

## 2022-09-15 ENCOUNTER — Encounter (HOSPITAL_COMMUNITY): Payer: Self-pay | Admitting: Emergency Medicine

## 2022-09-15 ENCOUNTER — Ambulatory Visit (HOSPITAL_COMMUNITY)
Admission: EM | Admit: 2022-09-15 | Discharge: 2022-09-15 | Disposition: A | Discharge status: Home / Self Care | Payer: 59 | Attending: Emergency Medicine | Admitting: Emergency Medicine

## 2022-09-15 ENCOUNTER — Other Ambulatory Visit: Payer: Self-pay

## 2022-09-15 DIAGNOSIS — J189 Pneumonia, unspecified organism: Secondary | ICD-10-CM | POA: Diagnosis not present

## 2022-09-15 DIAGNOSIS — R059 Cough, unspecified: Secondary | ICD-10-CM

## 2022-09-15 MED ORDER — LEVOFLOXACIN 750 MG PO TABS: 750.0000 mg | ORAL_TABLET | Freq: Every day | ORAL | 0 refills | Status: DC

## 2022-09-15 MED ORDER — GUAIFENESIN ER 600 MG PO TB12: 600.0000 mg | ORAL_TABLET | Freq: Two times a day (BID) | ORAL | 0 refills | Status: AC

## 2022-09-15 MED ORDER — LEVOFLOXACIN 750 MG PO TABS: 750.0000 mg | ORAL_TABLET | Freq: Every day | ORAL | 0 refills | Status: AC

## 2022-09-15 NOTE — Discharge Instructions (Addendum)
I am treating you for pneumonia. Please take medication as prescribed. Take with food to avoid upset stomach.  If symptoms do not improve after the 5 days, or if they worsen, please go to the emergency department for further evaluation.  I also recommend mucinex for congestion and cough. Take with lots of fluids.

## 2022-09-15 NOTE — ED Triage Notes (Signed)
Cough, headache and fatigue for 1 to 1 1/2 weeks ago.   Has not been tested for covid.    Patient reports husband was diagnosed with flu and pneumonia 2 days ago.

## 2022-09-15 NOTE — ED Provider Notes (Signed)
[facility]    CSN: [id number] Arrival date & time: 09/15/22  1529     History   Chief Complaint Chief Complaint  Patient presents with   Cough    HPI Kaylee Hill is a 42 y.o. female.  Presents with 1+ week of intermittent congestion and cough She's had a couple days where she feels better and then symptoms will return Cough is mix of dry/productive. She feels congestion in her chest No fevers Has not tried any medications for her symptoms  Husband was just diagnosed with flu and pneumonia  Reports she feels the same way she felt when she had pneumonia in the past.  Chart review shows pneumonia 1 year ago   Past Medical History:  Diagnosis Date   Anxiety    Bipolar 1 disorder (HCC)    Depression    Gestational diabetes    Headache    Hypertension     Patient Active Problem List   Diagnosis Date Noted   Anemia due to zinc deficiency 04/20/2022   Visit for screening mammogram 04/15/2022   Anxiety disorder, unspecified 01/19/2022   Major depressive disorder, single episode, unspecified 01/19/2022   Involuntary muscle contractions 11/09/2021   Seasonal allergic rhinitis due to pollen 06/06/2021   Routine general medical examination at a health care facility 02/07/2021   Type II diabetes mellitus with manifestations (HCC) 02/05/2021   B12 deficiency 02/05/2021   Vitreomacular adhesion of both eyes 11/28/2020   OSA (obstructive sleep apnea) 11/28/2020   Hyperlipidemia associated with type 2 diabetes mellitus (HCC) 04/17/2020   Arthropathy of lumbar facet joint 06/01/2019   Primary osteoarthritis of both knees 06/01/2019   Osteoarthritis of right acromioclavicular joint 04/09/2019   Vitamin D deficiency 08/12/2018   Iron deficiency anemia, unspecified 08/07/2018   Class 3 obesity (HCC) 05/20/2018   Diabetes mellitus with no complication (HCC) 05/01/2017   Bipolar disorder with psychotic features (HCC) 04/28/2017   Sickle cell trait (HCC) 12/09/2014    Benign essential hypertension 04/09/2013   Familial multiple lipoprotein-type hyperlipidemia 04/09/2013    Past Surgical History:  Procedure Laterality Date   BREAST SURGERY      OB History     Gravida  1   Para  1   Term  1   Preterm      AB      Living  1      SAB      IAB      Ectopic      Multiple      Live Births  1            Home Medications    Prior to Admission medications   Medication Sig Start Date End Date Taking? Authorizing Provider  Continuous Blood Gluc Receiver (DEXCOM G7 RECEIVER) DEVI use as directed 08/19/22   Etta Grandchild, MD  Continuous Blood Gluc Sensor (DEXCOM G7 SENSOR) MISC Use as directed 08/19/22   Etta Grandchild, MD  guaiFENesin (MUCINEX) 600 MG 12 hr tablet Take 1 tablet (600 mg total) by mouth 2 (two) times daily for 5 days. 09/15/22 09/20/22 Yes Chan Rosasco, Lurena Joiner, PA-C  botulinum toxin Type A (BOTOX) 100 units SOLR injection Inject 50 -100 units every 3 months 02/19/22     Deutetrabenazine (AUSTEDO) 12 MG TABS Take 2 tablets by mouth 2 (two) times daily. 10/19/21     Deutetrabenazine (AUSTEDO) 12 MG TABS Take 2 tablets (24 mg) by mouth 2 (two) times daily. 12/10/21  Deutetrabenazine (AUSTEDO) 12 MG TABS Take 24 mg by mouth 2 (two) times daily. 01/21/22     Deutetrabenazine (AUSTEDO) 12 MG TABS Take 2 tablets (24mg ) by mouth 2 (two) times daily. 03/11/22     Dulaglutide (TRULICITY) 1.5 WU/9.8JX SOPN Inject 1.5 mg into the skin once a week. 08/16/22   Janith Lima, MD  Ferric Maltol (ACCRUFER) 30 MG CAPS Take 1 capsule by mouth in the morning and at bedtime. 04/20/22   Janith Lima, MD  fluticasone (FLONASE) 50 MCG/ACT nasal spray Place 2 sprays into both nostrils daily. 12/11/21   Janith Lima, MD  levocetirizine (XYZAL) 5 MG tablet Take 1 tablet (5 mg total) by mouth every evening. 12/11/21   Janith Lima, MD  levofloxacin (LEVAQUIN) 750 MG tablet Take 1 tablet (750 mg total) by mouth daily for 5 days. 09/15/22 09/20/22   Terah Robey, Wells Guiles, PA-C  lithium carbonate 300 MG capsule Take 1 capsule (300 mg total) by mouth every evening for 4 days, THEN 2 capsules (600 mg total) every evening for 4 days, THEN 3 capsules (900 mg total) every evening. 03/11/22 08/10/22    lithium carbonate 300 MG capsule Take 3 capsules (900 mg total) by mouth every evening. 07/22/22     LORazepam (ATIVAN) 1 MG tablet Take 1 tablet (1 mg total) by mouth 3 (three) times daily. 03/11/22     LORazepam (ATIVAN) 1 MG tablet Take 1 tablet (1 mg total) by mouth 2 (two) times daily AND 0.5 tablets (0.5 mg total) daily. 07/22/22     metFORMIN (GLUCOPHAGE-XR) 750 MG 24 hr tablet Take 2 tablets (1,500 mg total) by mouth daily with breakfast. 04/15/22   Janith Lima, MD  Multiple Vitamin (MULTIVITAMIN) tablet Take by mouth daily. Chew 2 per day    [provider]  nebivolol (BYSTOLIC) 5 MG tablet Take 1 tablet (5 mg total) by mouth daily. 04/15/22   Janith Lima, MD  Probiotic Product (PROBIOTIC PO) Take by mouth daily.    [provider]  QUEtiapine (SEROQUEL) 100 MG tablet Take 0.5 tablets  by mouth at bedtime for 2 days, THEN 1 tablet  at bedtime for 2 days, THEN 1.5 tablets at bedtime for 2 days, THEN 2 tablets at bedtime 06/12/22 09/26/22    traZODone (DESYREL) 50 MG tablet take 1/2 - 2 tablet by mouth at night as needed for insomnia 12/04/20     zinc gluconate 50 MG tablet Take 1 tablet (50 mg total) by mouth daily. 04/20/22   Janith Lima, MD    Family History Family History  Problem Relation Age of Onset   Bipolar disorder Mother    Diabetes Father    Hypertension Father    Schizophrenia Father    Asthma Brother    Healthy Daughter    Diabetes Maternal Grandmother    Cancer Maternal Grandmother        breast, pancreatic cancer   Dementia Maternal Grandfather     Social History Social History   Tobacco Use   Smoking status: Former    Packs/day: 0.10    Years: 1.00    Total pack years: 0.10    Types: Cigarettes     Quit date: 2000    Years since quitting: 24.0    Passive exposure: Never   Smokeless tobacco: Never  Vaping Use   Vaping Use: Never used  Substance Use Topics   Alcohol use: No   Drug use: No     Allergies  Patient has no known allergies.   Review of Systems Review of Systems  Respiratory:  Positive for cough.    As per HPI  Physical Exam Triage Vital Signs ED Triage Vitals  Enc Vitals Group     BP 09/15/22 1729 (!) 134/92     Pulse Rate 09/15/22 1729 77     Resp 09/15/22 1729 (!) 22     Temp 09/15/22 1729 98.4 F (36.9 C)     Temp Source 09/15/22 1729 Oral     SpO2 09/15/22 1729 97 %     Weight --      Height --      Head Circumference --      Peak Flow --      Pain Score 09/15/22 1726 0     Pain Loc --      Pain Edu? --      Excl. in [place]? --    No data found.  Updated Vital Signs BP (!) 134/92 (BP Location: Right Arm) Comment (BP Location): regular cuff/forearm  Pulse 77   Temp 98.4 F (36.9 C) (Oral)   Resp (!) 22   LMP 08/29/2022   SpO2 97%    Physical Exam Vitals and nursing note reviewed.  Constitutional:      General: She is not in acute distress. HENT:     Nose: Congestion present. No rhinorrhea.     Mouth/Throat:     Mouth: Mucous membranes are moist.     Pharynx: Oropharynx is clear. No posterior oropharyngeal erythema.  Eyes:     Conjunctiva/sclera: Conjunctivae normal.  Cardiovascular:     Rate and Rhythm: Normal rate and regular rhythm.     Pulses: Normal pulses.     Heart sounds: Normal heart sounds.  Pulmonary:     Effort: Pulmonary effort is normal. No respiratory distress.     Comments: Clear with mildly decreased sounds R lower lobe Musculoskeletal:     Cervical back: Normal range of motion.  Lymphadenopathy:     Cervical: No cervical adenopathy.  Skin:    General: Skin is warm and dry.  Neurological:     Mental Status: She is alert and oriented to person, place, and time.     UC Treatments / Results  Labs (all  labs ordered are listed, but only abnormal results are displayed) Labs Reviewed - No data to display  EKG   Radiology DG Chest 2 View  Result Date: 09/15/2022 CLINICAL DATA:  Cough EXAM: CHEST - 2 VIEW COMPARISON:  Chest radiograph 11/28/2021 FINDINGS: The heart size and mediastinal contours are within normal limits. Possible subtle opacity at the right lung base. Left lung appears clear. No pneumothorax or pleural effusion. No acute finding in the visualized skeleton. IMPRESSION: Possible subtle opacity at the right lung base, could represent atelectasis or mild infection. Electronically Signed   By: Audie Pinto M.D.   On: 09/15/2022 18:19    Procedures Procedures   Medications Ordered in UC Medications - No data to display  Initial Impression / Assessment and Plan / UC Course  I have reviewed the triage vital signs and the nursing notes.  Pertinent labs & imaging results that were available during my care of the patient were reviewed by me and considered in my medical decision making (see chart for details).   Xray with possible subtle opacity of the right lung base.  This is consistent with exam. Patient with hx DM2, obesity, hyperlipidemia Will treat with monotherapy levaquin daily x 5  days  --Sent to [facility] per patient request. Transmission to pharmacy failed 3x. I called pharmacy and technician verified pharmacy received this prescription and it will be ready for patient.  Additionally recommend mucinex BID with lots of fluids for congestion/cough in the meantime.  --Sent this to [facility] for most affordable price with goodrx coupon.  Return precautions discussed, ED precautions for worsening symptoms despite medication. Patient agrees to plan  Final Clinical Impressions(s) / UC Diagnoses   Final diagnoses:  Pneumonia of right lower lobe due to infectious organism     Discharge Instructions      I am treating you for pneumonia. Please take medication as prescribed.  Take with food to avoid upset stomach.  If symptoms do not improve after the 5 days, or if they worsen, please go to the emergency department for further evaluation.  I also recommend mucinex for congestion and cough. Take with lots of fluids.     ED Prescriptions     Medication Sig Dispense Auth. Provider         guaiFENesin (MUCINEX) 600 MG 12 hr tablet Take 1 tablet (600 mg total) by mouth 2 (two) times daily for 5 days. 10 tablet Kasin Tonkinson, PA-C   levofloxacin (LEVAQUIN) 750 MG tablet Take 1 tablet (750 mg total) by mouth daily for 5 days. 5 tablet Xavien Dauphinais, Wells Guiles, PA-C      PDMP not reviewed this encounter.   Kyra Leyland 09/15/22 [id number]

## 2022-09-18 ENCOUNTER — Other Ambulatory Visit (HOSPITAL_COMMUNITY): Payer: Self-pay

## 2022-09-23 DIAGNOSIS — F419 Anxiety disorder, unspecified: Secondary | ICD-10-CM | POA: Diagnosis not present

## 2022-09-23 DIAGNOSIS — R69 Illness, unspecified: Secondary | ICD-10-CM | POA: Diagnosis not present

## 2022-09-24 ENCOUNTER — Ambulatory Visit (INDEPENDENT_AMBULATORY_CARE_PROVIDER_SITE_OTHER): Payer: 59

## 2022-09-24 ENCOUNTER — Ambulatory Visit: Payer: 59 | Admitting: Internal Medicine

## 2022-09-24 ENCOUNTER — Encounter: Payer: Self-pay | Admitting: Internal Medicine

## 2022-09-24 VITALS — BP 126/82 | HR 85 | Temp 98.3°F | Resp 16 | Ht 62.0 in | Wt 233.0 lb

## 2022-09-24 DIAGNOSIS — J189 Pneumonia, unspecified organism: Secondary | ICD-10-CM | POA: Insufficient documentation

## 2022-09-24 DIAGNOSIS — D538 Other specified nutritional anemias: Secondary | ICD-10-CM | POA: Diagnosis not present

## 2022-09-24 DIAGNOSIS — R918 Other nonspecific abnormal finding of lung field: Secondary | ICD-10-CM | POA: Diagnosis not present

## 2022-09-24 DIAGNOSIS — I10 Essential (primary) hypertension: Secondary | ICD-10-CM

## 2022-09-24 DIAGNOSIS — L989 Disorder of the skin and subcutaneous tissue, unspecified: Secondary | ICD-10-CM | POA: Diagnosis not present

## 2022-09-24 DIAGNOSIS — D500 Iron deficiency anemia secondary to blood loss (chronic): Secondary | ICD-10-CM

## 2022-09-24 DIAGNOSIS — E118 Type 2 diabetes mellitus with unspecified complications: Secondary | ICD-10-CM

## 2022-09-24 DIAGNOSIS — E538 Deficiency of other specified B group vitamins: Secondary | ICD-10-CM | POA: Diagnosis not present

## 2022-09-24 LAB — BASIC METABOLIC PANEL
BUN: 4 mg/dL — ABNORMAL LOW (ref 6–23)
CO2: 31 mEq/L (ref 19–32)
Calcium: 9.7 mg/dL (ref 8.4–10.5)
Chloride: 100 mEq/L (ref 96–112)
Creatinine, Ser: 0.51 mg/dL (ref 0.40–1.20)
GFR: 114.17 mL/min (ref >60.00)
Glucose, Bld: 113 mg/dL — ABNORMAL HIGH (ref 70–99)
Potassium: 3.8 mEq/L (ref 3.5–5.1)
Sodium: 137 mEq/L (ref 135–145)

## 2022-09-24 LAB — CBC WITH DIFFERENTIAL/PLATELET
Basophils Absolute: 0 10*3/uL (ref 0.0–0.1)
Basophils Relative: 0.3 % (ref 0.0–3.0)
Eosinophils Absolute: 0.3 10*3/uL (ref 0.0–0.7)
Eosinophils Relative: 2.9 % (ref 0.0–5.0)
HCT: 33.9 % — ABNORMAL LOW (ref 36.0–46.0)
Hemoglobin: 11.1 g/dL — ABNORMAL LOW (ref 12.0–15.0)
Lymphocytes Relative: 30.9 % (ref 12.0–46.0)
Lymphs Abs: 3.1 10*3/uL (ref 0.7–4.0)
MCHC: 32.7 g/dL (ref 30.0–36.0)
MCV: 72.8 fl — ABNORMAL LOW (ref 78.0–100.0)
Monocytes Absolute: 0.6 10*3/uL (ref 0.1–1.0)
Monocytes Relative: 6.1 % (ref 3.0–12.0)
Neutro Abs: 6 10*3/uL (ref 1.4–7.7)
Neutrophils Relative %: 59.8 % (ref 43.0–77.0)
Platelets: 323 10*3/uL (ref 150.0–400.0)
RBC: 4.65 Mil/uL (ref 3.87–5.11)
RDW: 16.6 % — ABNORMAL HIGH (ref 11.5–15.5)
WBC: 10 10*3/uL (ref 4.0–10.5)

## 2022-09-24 LAB — HEPATIC FUNCTION PANEL
ALT: 9 U/L (ref 0–35)
AST: 16 U/L (ref 0–37)
Albumin: 3.8 g/dL (ref 3.5–5.2)
Alkaline Phosphatase: 71 U/L (ref 39–117)
Bilirubin, Direct: 0 mg/dL (ref 0.0–0.3)
Total Bilirubin: 0.3 mg/dL (ref 0.2–1.2)
Total Protein: 7.8 g/dL (ref 6.0–8.3)

## 2022-09-24 LAB — HEMOGLOBIN A1C: Hgb A1c MFr Bld: 7.2 % — ABNORMAL HIGH (ref 4.6–6.5)

## 2022-09-24 LAB — IBC + FERRITIN
Ferritin: 47.3 ng/mL (ref 10.0–291.0)
Iron: 57 ug/dL (ref 42–145)
Saturation Ratios: 15.3 % — ABNORMAL LOW (ref 20.0–50.0)
TIBC: 372.4 ug/dL (ref 250.0–450.0)
Transferrin: 266 mg/dL (ref 212.0–360.0)

## 2022-09-24 LAB — FOLATE: Folate: >23.8 ng/mL (ref >5.9)

## 2022-09-24 MED ORDER — CYANOCOBALAMIN 1000 MCG/ML IJ SOLN
1000.0000 ug | Freq: Once | INTRAMUSCULAR | Status: AC
  Administered 2022-09-24: 1000 ug via INTRAMUSCULAR

## 2022-09-24 NOTE — Progress Notes (Signed)
Subjective:  Patient ID: Kaylee Hill, female    DOB: 12/22/1980  Age: 42 y.o. MRN: [id number]  CC: Cough, Diabetes, Anemia, and Rash   HPI NOVAH GREGSTON presents for f/up -  She complains of a 10-day history of nonproductive cough.  She has been treated for right lower lobe pneumonia with a fluoroquinolone.  She has had some shortness of breath but denies fever, chills, night sweats, or hemoptysis.  Outpatient Medications Prior to Visit  Medication Sig Dispense Refill   botulinum toxin Type A (BOTOX) 100 units SOLR injection Inject 50 -100 units every 3 months 1 each 4   Continuous Blood Gluc Receiver (DEXCOM G7 RECEIVER) DEVI use as directed 9 each 1   Continuous Blood Gluc Sensor (DEXCOM G7 SENSOR) MISC Use as directed 9 each 1   Deutetrabenazine (AUSTEDO) 12 MG TABS Take 2 tablets by mouth 2 (two) times daily. 120 tablet 3   Deutetrabenazine (AUSTEDO) 12 MG TABS Take 2 tablets (24 mg) by mouth 2 (two) times daily. 120 tablet 3   Deutetrabenazine (AUSTEDO) 12 MG TABS Take 24 mg by mouth 2 (two) times daily. 120 tablet 3   Deutetrabenazine (AUSTEDO) 12 MG TABS Take 2 tablets (24mg ) by mouth 2 (two) times daily. 120 tablet 3   Dulaglutide (TRULICITY) 1.5 MG/0.5ML SOPN Inject 1.5 mg into the skin once a week. 6 mL 0   Ferric Maltol (ACCRUFER) 30 MG CAPS Take 1 capsule by mouth in the morning and at bedtime. 180 capsule 1   fluticasone (FLONASE) 50 MCG/ACT nasal spray Place 2 sprays into both nostrils daily. 48 g 1   levocetirizine (XYZAL) 5 MG tablet Take 1 tablet (5 mg total) by mouth every evening. 90 tablet 1   lithium carbonate 300 MG capsule Take 3 capsules (900 mg total) by mouth every evening. 90 capsule 3   LORazepam (ATIVAN) 1 MG tablet Take 1 tablet (1 mg total) by mouth 3 (three) times daily. 90 tablet 3   LORazepam (ATIVAN) 1 MG tablet Take 1 tablet (1 mg total) by mouth 2 (two) times daily AND 0.5 tablets (0.5 mg total) daily. 75 tablet 3   metFORMIN  (GLUCOPHAGE-XR) 750 MG 24 hr tablet Take 2 tablets (1,500 mg total) by mouth daily with breakfast. 180 tablet 1   Multiple Vitamin (MULTIVITAMIN) tablet Take by mouth daily. Chew 2 per day     nebivolol (BYSTOLIC) 5 MG tablet Take 1 tablet (5 mg total) by mouth daily. 90 tablet 1   Probiotic Product (PROBIOTIC PO) Take by mouth daily.     QUEtiapine (SEROQUEL) 100 MG tablet Take 0.5 tablets  by mouth at bedtime for 2 days, THEN 1 tablet  at bedtime for 2 days, THEN 1.5 tablets at bedtime for 2 days, THEN 2 tablets at bedtime 60 tablet 3   traZODone (DESYREL) 50 MG tablet take 1/2 - 2 tablet by mouth at night as needed for insomnia 180 tablet 1   zinc gluconate 50 MG tablet Take 1 tablet (50 mg total) by mouth daily. 90 tablet 1   lithium carbonate 300 MG capsule Take 1 capsule (300 mg total) by mouth every evening for 4 days, THEN 2 capsules (600 mg total) every evening for 4 days, THEN 3 capsules (900 mg total) every evening. 90 capsule 3   No facility-administered medications prior to visit.    ROS Review of Systems  Constitutional:  Positive for unexpected weight change (wt loss). Negative for chills, diaphoresis and  fatigue.  HENT: Negative.    Eyes: Negative.   Respiratory:  Positive for cough and shortness of breath. Negative for wheezing.   Cardiovascular:  Negative for chest pain, palpitations and leg swelling.  Gastrointestinal:  Negative for abdominal pain, constipation, diarrhea, nausea and vomiting.  Endocrine: Negative.   Genitourinary: Negative.   Musculoskeletal: Negative.   Skin: Negative.  Negative for color change and pallor.       Right posterior scalp lesion for 4 months  Neurological: Negative.  Negative for dizziness.  Hematological:  Negative for adenopathy. Does not bruise/bleed easily.  Psychiatric/Behavioral: Negative.      Objective:  BP 126/82 (BP Location: Right Arm, Patient Position: Sitting, Cuff Size: Large)   Pulse 85   Temp 98.3 F (36.8 C) (Oral)    Resp 16   Ht 5\' 2"  (1.575 m)   Wt 233 lb (105.7 kg)   LMP 08/29/2022 (Exact Date)   SpO2 97%   BMI 42.62 kg/m   BP Readings from Last 3 Encounters:  09/26/22 (!) 138/109  09/24/22 126/82  09/15/22 (!) 134/92    Wt Readings from Last 3 Encounters:  09/25/22 233 lb (105.7 kg)  09/24/22 233 lb (105.7 kg)  04/15/22 245 lb 4 oz (111.2 kg)    Physical Exam Vitals reviewed.  Constitutional:      General: She is not in acute distress.    Appearance: She is ill-appearing. She is not diaphoretic.  HENT:     Nose: Nose normal.     Mouth/Throat:     Mouth: Mucous membranes are moist.  Eyes:     General: No scleral icterus.    Conjunctiva/sclera: Conjunctivae normal.  Cardiovascular:     Rate and Rhythm: Normal rate and regular rhythm.     Heart sounds: No murmur heard. Pulmonary:     Effort: Pulmonary effort is normal.     Breath sounds: No stridor. No wheezing, rhonchi or rales.  Abdominal:     General: Abdomen is flat.     Palpations: There is no mass.     Tenderness: There is no abdominal tenderness. There is no guarding.     Hernia: No hernia is present.  Musculoskeletal:        General: Normal range of motion.     Cervical back: Neck supple.     Right lower leg: No edema.     Left lower leg: No edema.  Lymphadenopathy:     Cervical: No cervical adenopathy.  Skin:    Findings: Lesion present. No rash.  Neurological:     General: No focal deficit present.     Mental Status: She is alert. Mental status is at baseline.  Psychiatric:        Mood and Affect: Mood normal.        Behavior: Behavior normal.     Lab Results  Component Value Date   WBC 11.5 (H) 09/25/2022   HGB 10.9 (L) 09/25/2022   HCT 33.8 (L) 09/25/2022   PLT 335 09/25/2022   GLUCOSE 123 (H) 09/25/2022   CHOL 194 04/15/2022   TRIG 337.0 (H) 04/15/2022   HDL 29.70 (L) 04/15/2022   LDLDIRECT 77.0 04/15/2022   LDLCALC 121 (H) 04/17/2020   ALT 9 09/24/2022   AST 16 09/24/2022   NA 138  09/25/2022   K 3.8 09/25/2022   CL 99 09/25/2022   CREATININE 0.58 09/25/2022   BUN <5 (L) 09/25/2022   CO2 29 09/25/2022   TSH 1.890 10/23/2021   HGBA1C  7.2 (H) 09/24/2022   MICROALBUR 0.8 04/15/2022    DG Chest 2 View  Result Date: 09/15/2022 CLINICAL DATA:  Cough EXAM: CHEST - 2 VIEW COMPARISON:  Chest radiograph 11/28/2021 FINDINGS: The heart size and mediastinal contours are within normal limits. Possible subtle opacity at the right lung base. Left lung appears clear. No pneumothorax or pleural effusion. No acute finding in the visualized skeleton. IMPRESSION: Possible subtle opacity at the right lung base, could represent atelectasis or mild infection. Electronically Signed   By: Emmaline Kluver M.D.   On: 09/15/2022 18:19    DG Chest 2 View  Result Date: 09/25/2022 CLINICAL DATA:  Shortness of breath. EXAM: CHEST - 2 VIEW COMPARISON:  None Available. FINDINGS: No focal consolidation, pleural effusion, or pneumothorax. The cardiac silhouette is within limits. No acute osseous pathology. IMPRESSION: No active cardiopulmonary disease. Electronically Signed   By: Elgie Collard M.D.   On: 09/25/2022 21:12   DG Chest 2 View  Result Date: 09/24/2022 CLINICAL DATA:  Follow-up pneumonia EXAM: CHEST - 2 VIEW COMPARISON:  09/15/2022 FINDINGS: The heart size and mediastinal contours are within normal limits. Persistent but improving interstitial opacity at the periphery of the right lower lobe. New small subtle interstitial opacity within the right upper lobe. Left lung is clear. No pleural effusion or pneumothorax. The visualized skeletal structures are unremarkable. IMPRESSION: Persistent but improving interstitial opacity at the periphery of the right lower lobe. New small subtle interstitial opacity within the right upper lobe. Electronically Signed   By: Duanne Guess D.O.   On: 09/24/2022 09:37     Assessment & Plan:   Liahona was seen today for cough, diabetes, anemia and  rash.  Diagnoses and all orders for this visit:  Benign essential hypertension- Her blood pressure is adequately well-controlled. -     Hepatic function panel; Future -     Basic metabolic panel; Future -     Basic metabolic panel -     Hepatic function panel  Type II diabetes mellitus with manifestations (HCC)- Her A1c has improved. -     Hemoglobin A1c; Future -     Basic metabolic panel; Future -     Basic metabolic panel -     Hemoglobin A1c  Iron deficiency anemia due to chronic blood loss -     CBC with Differential/Platelet; Future -     IBC + Ferritin; Future -     IBC + Ferritin -     CBC with Differential/Platelet  B12 deficiency -     CBC with Differential/Platelet; Future -     cyanocobalamin (VITAMIN B12) injection 1,000 mcg -     Folate; Future -     Folate -     CBC with Differential/Platelet  Anemia due to zinc deficiency -     CBC with Differential/Platelet; Future -     CBC with Differential/Platelet  Pneumonia of right lower lobe due to infectious organism- The right lower lobe opacity has improved but there is a new opacity in the right upper lobe and she has weight loss.  I recommended a high-resolution CT to evaluate for persistent infection/malignancy, -     DG Chest 2 View; Future -     CT Chest High Resolution; Future  Opacity of lung on imaging study -     CT Chest High Resolution; Future  Skin lesion of scalp -     Ambulatory referral to Dermatology   I am having Sebrina D. Mackert maintain  her multivitamin, traZODone, [place], Probiotic Product (PROBIOTIC PO), [place], levocetirizine, fluticasone, [place], Botox, LORazepam, lithium carbonate, [place], metFORMIN, nebivolol, ACCRUFeR, zinc gluconate, QUEtiapine, lithium carbonate, LORazepam, Trulicity, Dexcom G7 Receiver, and Dexcom G7 Sensor. We administered cyanocobalamin.  Meds ordered this encounter  Medications   cyanocobalamin (VITAMIN B12) injection 1,000 mcg     Follow-up: No  follow-ups on file.  Sanda Linger, MD

## 2022-09-24 NOTE — Patient Instructions (Signed)
Community-Acquired Pneumonia, Adult Pneumonia is a lung infection that causes inflammation and the buildup of mucus and fluids in the lungs. This may cause coughing and difficulty breathing. Community-acquired pneumonia is pneumonia that develops in people who are not, and have not recently been, in a hospital or other health care facility. Usually, pneumonia develops as a result of an illness that is caused by a virus, such as the common cold and the flu (influenza). It can also be caused by bacteria or fungi. While the common cold and influenza can pass from person to person (are contagious), pneumonia itself is not considered contagious. What are the causes? This condition may be caused by: Viruses. Bacteria. Fungi. What increases the risk? The following factors may make you more likely to develop this condition: Being over age 65 or having certain medical conditions, such as: A long-term (chronic) disease, such as: chronic obstructive pulmonary disease (COPD), asthma, heart failure, diabetes, or kidney disease. A condition that increases the risk of breathing in (aspirating) mucus and other fluids from your mouth and nose. A weakened body defense system (immune system). Having had your spleen removed (splenectomy). The spleen is the organ that helps fight germs and infections. Not cleaning your teeth and gums well (poor dental hygiene). Using tobacco products. Traveling to places where germs that cause pneumonia are present or being near certain animals or animal habitats that could have germs that cause pneumonia. What are the signs or symptoms? Symptoms of this condition include: A dry cough or a wet (productive) cough. A fever, sweating, or chills. Chest pain, especially when breathing deeply or coughing. Fast breathing, difficulty breathing, or shortness of breath. Tiredness (fatigue) and muscle aches. How is this diagnosed? This condition may be diagnosed based on your medical  history or a physical exam. You may also have tests, including: Imaging, such as a chest X-ray or lung ultrasound. Tests of: The level of oxygen and other gases in your blood. Mucus from your lungs (sputum). Fluid around your lungs (pleural fluid). Your urine. How is this treated? Treatment for this condition depends on many factors, such as the cause of your pneumonia, your medicines, and other medical conditions that you have. For most adults, pneumonia may be treated at home. In some cases, treatment must happen in a hospital and may include: Medicines that are given by mouth (orally) or through an IV, including: Antibiotic medicines, if bacteria caused the pneumonia. Medicines that kill viruses (antiviral medicines), if a virus caused the pneumonia. Oxygen therapy. Severe pneumonia, although rare, may require the following treatments: Mechanical ventilation.This procedure uses a machine to help you breathe if you cannot breathe well on your own or maintain a safe level of blood oxygen. Thoracentesis. This procedure removes any buildup of pleural fluid to help with breathing. Follow these instructions at home:  Medicines Take over-the-counter and prescription medicines only as told by your health care provider. Take cough medicine only if you have trouble sleeping. Cough medicine can prevent your body from removing mucus from your lungs. If you were prescribed antibiotics, take them as told by your health care provider. Do not stop taking the antibiotic even if you start to feel better. Lifestyle     Do not drink alcohol. Do not use any products that contain nicotine or tobacco. These products include cigarettes, chewing tobacco, and vaping devices, such as e-cigarettes. If you need help quitting, ask your health care provider. Eat a healthy diet. This includes plenty of vegetables, fruits, whole grains, low-fat   dairy products, and lean protein. General instructions Rest a lot and  get at least 8 hours of sleep each night. Sleep in a partly upright position at night. Place a few pillows under your head or sleep in a reclining chair. Return to your normal activities as told by your health care provider. Ask your health care provider what activities are safe for you. Drink enough fluid to keep your urine pale yellow. This helps to thin the mucus in your lungs. If your throat is sore, gargle with a mixture of salt and water 3-4 times a day or as needed. To make salt water, completely dissolve -1 tsp (3-6 g) of salt in 1 cup (237 mL) of warm water. Keep all follow-up visits. How is this prevented? You can lower your risk of developing community-acquired pneumonia by: Getting the pneumonia vaccine. There are different types and schedules of pneumonia vaccines. Ask your health care provider which option is best for you. Consider getting the pneumonia vaccine if: You are older than 44 years of age. You are 19-65 years of age and are receiving cancer treatment, have chronic lung disease, or have other medical conditions that affect your immune system. Ask your health care provider if this applies to you. Getting your influenza vaccine every year. Ask your health care provider which type of vaccine is best for you. Getting regular dental checkups. Washing your hands often with soap and water for at least 20 seconds. If soap and water are not available, use hand sanitizer. Contact a health care provider if: You have a fever. You have trouble sleeping because you cannot control your cough with cough medicine. Get help right away if: Your shortness of breath becomes worse. Your chest pain increases. Your sickness becomes worse, especially if you are an older adult or have a weak immune system. You cough up blood. These symptoms may be an emergency. Get help right away. Call 911. Do not wait to see if the symptoms will go away. Do not drive yourself to the  hospital. Summary Pneumonia is an infection of the lungs. Community-acquired pneumonia develops in people who have not been in the hospital. It can be caused by bacteria, viruses, or fungi. This condition may be treated with antibiotics or antiviral medicines. Severe pneumonia may require a hospital stay and treatment to help with breathing. This information is not intended to replace advice given to you by your health care provider. Make sure you discuss any questions you have with your health care provider. Document Revised: 10/17/2021 Document Reviewed: 10/17/2021 Elsevier Patient Education  2023 [employer]  

## 2022-09-25 ENCOUNTER — Emergency Department (HOSPITAL_COMMUNITY): Payer: 59

## 2022-09-25 ENCOUNTER — Other Ambulatory Visit: Payer: Self-pay

## 2022-09-25 ENCOUNTER — Emergency Department (HOSPITAL_COMMUNITY)
Admission: EM | Admit: 2022-09-25 | Discharge: 2022-09-26 | Disposition: A | Discharge status: Home / Self Care | Payer: 59 | Attending: Emergency Medicine | Admitting: Emergency Medicine

## 2022-09-25 ENCOUNTER — Encounter (HOSPITAL_COMMUNITY): Payer: Self-pay | Admitting: Emergency Medicine

## 2022-09-25 DIAGNOSIS — Z7984 Long term (current) use of oral hypoglycemic drugs: Secondary | ICD-10-CM | POA: Insufficient documentation

## 2022-09-25 DIAGNOSIS — E119 Type 2 diabetes mellitus without complications: Secondary | ICD-10-CM | POA: Diagnosis not present

## 2022-09-25 DIAGNOSIS — R059 Cough, unspecified: Secondary | ICD-10-CM | POA: Diagnosis not present

## 2022-09-25 DIAGNOSIS — D72829 Elevated white blood cell count, unspecified: Secondary | ICD-10-CM | POA: Insufficient documentation

## 2022-09-25 DIAGNOSIS — I10 Essential (primary) hypertension: Secondary | ICD-10-CM | POA: Diagnosis not present

## 2022-09-25 DIAGNOSIS — R0602 Shortness of breath: Secondary | ICD-10-CM | POA: Diagnosis not present

## 2022-09-25 DIAGNOSIS — D649 Anemia, unspecified: Secondary | ICD-10-CM | POA: Diagnosis not present

## 2022-09-25 DIAGNOSIS — Z1152 Encounter for screening for COVID-19: Secondary | ICD-10-CM | POA: Insufficient documentation

## 2022-09-25 DIAGNOSIS — Z79899 Other long term (current) drug therapy: Secondary | ICD-10-CM | POA: Insufficient documentation

## 2022-09-25 DIAGNOSIS — R052 Subacute cough: Secondary | ICD-10-CM | POA: Diagnosis not present

## 2022-09-25 LAB — CBC WITH DIFFERENTIAL/PLATELET
Abs Immature Granulocytes: 0.05 10*3/uL (ref 0.00–0.07)
Basophils Absolute: 0 10*3/uL (ref 0.0–0.1)
Basophils Relative: 0 %
Eosinophils Absolute: 0.3 10*3/uL (ref 0.0–0.5)
Eosinophils Relative: 3 %
HCT: 33.8 % — ABNORMAL LOW (ref 36.0–46.0)
Hemoglobin: 10.9 g/dL — ABNORMAL LOW (ref 12.0–15.0)
Immature Granulocytes: 0 %
Lymphocytes Relative: 32 %
Lymphs Abs: 3.7 10*3/uL (ref 0.7–4.0)
MCH: 23.7 pg — ABNORMAL LOW (ref 26.0–34.0)
MCHC: 32.2 g/dL (ref 30.0–36.0)
MCV: 73.6 fL — ABNORMAL LOW (ref 80.0–100.0)
Monocytes Absolute: 0.8 10*3/uL (ref 0.1–1.0)
Monocytes Relative: 7 %
Neutro Abs: 6.6 10*3/uL (ref 1.7–7.7)
Neutrophils Relative %: 58 %
Platelets: 335 10*3/uL (ref 150–400)
RBC: 4.59 MIL/uL (ref 3.87–5.11)
RDW: 15.9 % — ABNORMAL HIGH (ref 11.5–15.5)
WBC: 11.5 10*3/uL — ABNORMAL HIGH (ref 4.0–10.5)
nRBC: 0 % (ref 0.0–0.2)

## 2022-09-25 LAB — RESP PANEL BY RT-PCR (RSV, FLU A&B, COVID)  RVPGX2
Influenza A by PCR: NEGATIVE
Influenza B by PCR: NEGATIVE
Resp Syncytial Virus by PCR: NEGATIVE
SARS Coronavirus 2 by RT PCR: NEGATIVE

## 2022-09-25 LAB — BASIC METABOLIC PANEL
Anion gap: 10 (ref 5–15)
BUN: <5 mg/dL — ABNORMAL LOW (ref 6–20)
CO2: 29 mmol/L (ref 22–32)
Calcium: 9.6 mg/dL (ref 8.9–10.3)
Chloride: 99 mmol/L (ref 98–111)
Creatinine, Ser: 0.58 mg/dL (ref 0.44–1.00)
GFR, Estimated: >60 mL/min (ref >60)
Glucose, Bld: 123 mg/dL — ABNORMAL HIGH (ref 70–99)
Potassium: 3.8 mmol/L (ref 3.5–5.1)
Sodium: 138 mmol/L (ref 135–145)

## 2022-09-25 NOTE — ED Triage Notes (Signed)
Pt c/o of shortness of breath and inability to take a deep breath without coughing. Endorses scratchy throat and congestion. Pt states she was treated recently for pneumonia at urgent care and was told at follow up appt that one area has cleared up but another area was concerning for pneumonia.

## 2022-09-25 NOTE — ED Provider Triage Note (Signed)
Emergency Medicine Provider Triage Evaluation Note  Kaylee Hill , a 42 y.o. female  was evaluated in triage.  Pt complains of worsening shortness of breath.  Has been following up with PCP, diagnosed recently with likely mild pneumonia and was given antibiotic.  However reports shortness of breath is worsening.  Denies fevers or chills.  Denies chest pain or discomfort.  No Hx of asthma or COPD.  Noted Hx of bipolar type I, depression, anxiety, HTN.  Review of Systems  Positive:  Negative: See above  Physical Exam  BP (!) 144/93 (BP Location: Right Arm)   Pulse 76   Temp 98.8 F (37.1 C) (Oral)   Resp 18   Ht 5\' 2"  (1.575 m)   Wt 105.7 kg   LMP 08/29/2022 (Exact Date)   SpO2 96%   BMI 42.62 kg/m  Gen:   Awake, no distress   Resp:  Normal effort  MSK:   Moves extremities without difficulty  Other:  Equal chest rise.  Chest non-TTP.  Abdomen soft, nontender.  Medical Decision Making  Medically screening exam initiated at 8:37 PM.  Appropriate orders placed.  Kaylee Hill was informed that the remainder of the evaluation will be completed by another provider, this initial triage assessment does not replace that evaluation, and the importance of remaining in the ED until their evaluation is complete.     Prince Rome, PA-C 16/10/96 [id number]

## 2022-09-26 ENCOUNTER — Other Ambulatory Visit (HOSPITAL_COMMUNITY): Payer: Self-pay

## 2022-09-26 LAB — D-DIMER, QUANTITATIVE: D-Dimer, Quant: 0.38 ug/mL-FEU (ref 0.00–0.50)

## 2022-09-26 MED ORDER — BENZONATATE 100 MG PO CAPS
100.0000 mg | ORAL_CAPSULE | Freq: Three times a day (TID) | ORAL | 0 refills | Status: DC
  Filled 2022-09-26: qty 21, 7d supply, fill #0

## 2022-09-26 NOTE — Discharge Instructions (Signed)
You were evaluated today for a cough.  Your workup was reassuring for no signs of pneumonia or pulmonary edema.  I have prescribed a cough medicine that you may take as directed.  Please follow-up with your primary care provider for further evaluation and management as needed.  If you develop any life-threatening symptoms please return to the emergency department

## 2022-09-26 NOTE — ED Provider Notes (Signed)
[facility] Provider Note   CSN: [id number] Arrival date & time: 09/25/22  1848     History  Chief Complaint  Patient presents with   Shortness of Breath    Kaylee Hill is a 42 y.o. female.  HPI   Medical history including hypertension, bipolar, depression, anxiety presents with complaints of URI-like symptoms.  Patient states been going on since Christmas, states that she has been having a slight cough which has not been productive, feels slightly short of breath, shortness of breath is only when she is coughing, she denies any pleuritic chest pain, chest pain itself, she has had no fevers chills general body aches.  She states that she went to the urgent care where they diagnosed her with pneumonia, they put her on Levaquin, she then saw her primary care doctor who did another x-ray and they saw that the pneumonia was clearing up was not fully gone.  She is back here because she wants to be further assessed.  No history of PEs or DVTs not on oral birth control, no recent surgeries no long immobilization.     Home Medications Prior to Admission medications   Medication Sig Start Date End Date Taking? Authorizing Provider  botulinum toxin Type A (BOTOX) 100 units SOLR injection Inject 50 -100 units every 3 months 02/19/22     Continuous Blood Gluc Receiver (DEXCOM G7 RECEIVER) DEVI use as directed 08/19/22   Janith Lima, MD  Continuous Blood Gluc Sensor (DEXCOM G7 SENSOR) MISC Use as directed 08/19/22   Janith Lima, MD  Deutetrabenazine (AUSTEDO) 12 MG TABS Take 2 tablets by mouth 2 (two) times daily. 10/19/21     Deutetrabenazine (AUSTEDO) 12 MG TABS Take 2 tablets (24 mg) by mouth 2 (two) times daily. 12/10/21     Deutetrabenazine (AUSTEDO) 12 MG TABS Take 24 mg by mouth 2 (two) times daily. 01/21/22     Deutetrabenazine (AUSTEDO) 12 MG TABS Take 2 tablets (24mg ) by mouth 2 (two) times daily. 03/11/22     Dulaglutide (TRULICITY)  1.5 TO/6.7TI SOPN Inject 1.5 mg into the skin once a week. 08/16/22   Janith Lima, MD  Ferric Maltol (ACCRUFER) 30 MG CAPS Take 1 capsule by mouth in the morning and at bedtime. 04/20/22   Janith Lima, MD  fluticasone (FLONASE) 50 MCG/ACT nasal spray Place 2 sprays into both nostrils daily. 12/11/21   Janith Lima, MD  levocetirizine (XYZAL) 5 MG tablet Take 1 tablet (5 mg total) by mouth every evening. 12/11/21   Janith Lima, MD  lithium carbonate 300 MG capsule Take 1 capsule (300 mg total) by mouth every evening for 4 days, THEN 2 capsules (600 mg total) every evening for 4 days, THEN 3 capsules (900 mg total) every evening. 03/11/22 08/10/22    lithium carbonate 300 MG capsule Take 3 capsules (900 mg total) by mouth every evening. 07/22/22     LORazepam (ATIVAN) 1 MG tablet Take 1 tablet (1 mg total) by mouth 3 (three) times daily. 03/11/22     LORazepam (ATIVAN) 1 MG tablet Take 1 tablet (1 mg total) by mouth 2 (two) times daily AND 0.5 tablets (0.5 mg total) daily. 07/22/22     metFORMIN (GLUCOPHAGE-XR) 750 MG 24 hr tablet Take 2 tablets (1,500 mg total) by mouth daily with breakfast. 04/15/22   Janith Lima, MD  Multiple Vitamin (MULTIVITAMIN) tablet Take by mouth daily. Chew 2 per day  [provider]  nebivolol (BYSTOLIC) 5 MG tablet Take 1 tablet (5 mg total) by mouth daily. 04/15/22   Janith Lima, MD  Probiotic Product (PROBIOTIC PO) Take by mouth daily.    [provider]  QUEtiapine (SEROQUEL) 100 MG tablet Take 0.5 tablets  by mouth at bedtime for 2 days, THEN 1 tablet  at bedtime for 2 days, THEN 1.5 tablets at bedtime for 2 days, THEN 2 tablets at bedtime 06/12/22 09/26/22    traZODone (DESYREL) 50 MG tablet take 1/2 - 2 tablet by mouth at night as needed for insomnia 12/04/20     zinc gluconate 50 MG tablet Take 1 tablet (50 mg total) by mouth daily. 04/20/22   Janith Lima, MD      Allergies    Patient has no known allergies.    Review of Systems    Review of Systems  Constitutional:  Negative for chills and fever.  Respiratory:  Positive for cough. Negative for shortness of breath.   Cardiovascular:  Negative for chest pain.  Gastrointestinal:  Negative for abdominal pain.  Neurological:  Negative for headaches.    Physical Exam Updated Vital Signs BP (!) 141/101   Pulse 77   Temp 98.9 F (37.2 C)   Resp (!) 25   Ht 5\' 2"  (1.575 m)   Wt 105.7 kg   LMP 08/29/2022 (Exact Date)   SpO2 100%   BMI 42.62 kg/m  Physical Exam Vitals and nursing note reviewed.  Constitutional:      General: She is not in acute distress.    Appearance: She is not ill-appearing.  HENT:     Head: Normocephalic and atraumatic.     Nose: No congestion.  Eyes:     Conjunctiva/sclera: Conjunctivae normal.  Cardiovascular:     Rate and Rhythm: Normal rate and regular rhythm.     Pulses: Normal pulses.     Heart sounds: No murmur heard.    No friction rub. No gallop.  Pulmonary:     Effort: No respiratory distress.     Breath sounds: No wheezing, rhonchi or rales.     Comments: No evidence of respiratory distress nontachypneic nonhypoxic speaking in full sentences, lung sounds are both clear bilaterally. Musculoskeletal:     Right lower leg: No edema.     Left lower leg: No edema.     Comments: No unilateral leg swelling no calf tenderness no palpable cords.  Skin:    General: Skin is warm and dry.  Neurological:     Mental Status: She is alert.  Psychiatric:        Mood and Affect: Mood normal.     ED Results / Procedures / Treatments   Labs (all labs ordered are listed, but only abnormal results are displayed) Labs Reviewed  BASIC METABOLIC PANEL - Abnormal; Notable for the following components:      Result Value   Glucose, Bld 123 (*)    BUN <5 (*)    All other components within normal limits  CBC WITH DIFFERENTIAL/PLATELET - Abnormal; Notable for the following components:   WBC 11.5 (*)    Hemoglobin 10.9 (*)    HCT 33.8  (*)    MCV 73.6 (*)    MCH 23.7 (*)    RDW 15.9 (*)    All other components within normal limits  RESP PANEL BY RT-PCR (RSV, FLU A&B, COVID)  RVPGX2  D-DIMER, QUANTITATIVE    EKG None  Radiology DG Chest 2  View  Result Date: 09/25/2022 CLINICAL DATA:  Shortness of breath. EXAM: CHEST - 2 VIEW COMPARISON:  None Available. FINDINGS: No focal consolidation, pleural effusion, or pneumothorax. The cardiac silhouette is within limits. No acute osseous pathology. IMPRESSION: No active cardiopulmonary disease. Electronically Signed   By: Elgie Collard M.D.   On: 09/25/2022 21:12   DG Chest 2 View  Result Date: 09/24/2022 CLINICAL DATA:  Follow-up pneumonia EXAM: CHEST - 2 VIEW COMPARISON:  09/15/2022 FINDINGS: The heart size and mediastinal contours are within normal limits. Persistent but improving interstitial opacity at the periphery of the right lower lobe. New small subtle interstitial opacity within the right upper lobe. Left lung is clear. No pleural effusion or pneumothorax. The visualized skeletal structures are unremarkable. IMPRESSION: Persistent but improving interstitial opacity at the periphery of the right lower lobe. New small subtle interstitial opacity within the right upper lobe. Electronically Signed   By: Duanne Guess D.O.   On: 09/24/2022 09:37    Procedures Procedures    Medications Ordered in ED Medications - No data to display  ED Course/ Medical Decision Making/ A&P                             Medical Decision Making Amount and/or Complexity of Data Reviewed Labs: ordered.   This patient presents to the ED for concern of cough, this involves an extensive number of treatment options, and is a complaint that carries with it a high risk of complications and morbidity.  The differential diagnosis includes PE, URI, pneumonia, ACS    Additional history obtained:  Additional history obtained from N/A External records from outside source obtained and  reviewed including primary care notes   Co morbidities that complicate the patient evaluation  Diabetes  Social Determinants of Health:  N/A    Lab Tests:  I Ordered, and personally interpreted labs.  The pertinent results include: CBC shows leukocytosis 11.5, noticed anemia hemoglobin 10.9, BMP shows glucose of 123, respiratory panel negative   Imaging Studies ordered:  I ordered imaging studies including chest x-ray  I independently visualized and interpreted imaging which showed unremarkable I agree with the radiologist interpretation   Cardiac Monitoring:  The patient was maintained on a cardiac monitor.  I personally viewed and interpreted the cardiac monitored which showed an underlying rhythm of: Sinus without signs of ischemia   Medicines ordered and prescription drug management:  I ordered medication including N/A I have reviewed the patients home medicines and have made adjustments as needed  Critical Interventions:  N/A   Reevaluation:  Presents with URI-like symptoms, triage obtain basic lab work imaging which I personally reviewed they are unremarkable, on my exam her lung sounds were clear but she was endorsing difficulty with breathin her vital signs are reassuring will add on D-dimer for exclusion possible PE.   Consultations Obtained:  N/A    Test Considered:  N/A    Rule out I have low suspicion for ACS as history is atypical, patient has no cardiac history, EKG was sinus rhythm without signs of ischemia.   Low suspicion for PE as patient denies pleuritic chest pain, patient denies leg pain, no pedal edema noted on exam D-dimer pending.  Suspicion for pneumonia is also low as chest x-ray is negative.  I doubt COPD/asthma exacerbation she has no tight sounding chest, no wheezing present during my examination.    Dispostion and problem list  Due to shift change  patient will be an off to Judeth Cornfield, PA-C  Follow-up D-dimer, negative  patient can be discharged home follow-up with primary care doctor for further evaluation.            Final Clinical Impression(s) / ED Diagnoses Final diagnoses:  Subacute cough    Rx / DC Orders ED Discharge Orders     None         Carroll Sage, PA-C 09/26/22 9935    Shon Baton, MD 09/30/22 [phone]

## 2022-09-26 NOTE — ED Provider Notes (Signed)
Patient care assumed from Kaylee Etienne, PA-C at shift change  In short, 42 year old female patient presented to the emergency department complaining of upper respiratory infection-like symptoms.  Patient states the symptoms been ongoing since Christmas.  She has a mild cough which is not productive, feels slightly short of breath when coughing, denies chest pain, fevers, chills, body aches.  Patient was diagnosed with pneumonia at urgent care and was prescribed Levaquin.  Primary care ordered an additional x-ray which showed an improvement in the pneumonia.  She does the emergency department requesting further assessment.  Patient has no history of PEs, DVTs, not on birth control, no recent surgeries, no long immobilization  Prior to my assumption of care basic labs and chest x-ray were performed.  Chest x-ray was unremarkable.  Negative respiratory panel.  Basic labs unremarkable.  D-dimer ordered just prior to my assumption of care Physical Exam  BP (!) 138/109   Pulse 76   Temp 98.9 F (37.2 C)   Resp 16   Ht 5\' 2"  (1.575 m)   Wt 105.7 kg   LMP 08/29/2022 (Exact Date)   SpO2 100%   BMI 42.62 kg/m   Physical Exam  Procedures  Procedures  ED Course / MDM    Medical Decision Making Amount and/or Complexity of Data Reviewed Labs: ordered.   I reviewed the D-dimer results which were normal.  At this time the patient's cough seems subacute in nature.  Her chest x-ray is improved from previous recent results.  Plan to discharge patient home with prescription for Ladona Ridgel and follow-up with primary care       Ronny Bacon 09/26/22 [id number]    Cristie Hem, MD 09/30/22 [phone]

## 2022-09-30 ENCOUNTER — Other Ambulatory Visit (HOSPITAL_COMMUNITY): Payer: Self-pay

## 2022-10-02 ENCOUNTER — Other Ambulatory Visit: Payer: Self-pay | Admitting: Internal Medicine

## 2022-10-02 ENCOUNTER — Ambulatory Visit
Admission: RE | Admit: 2022-10-02 | Discharge: 2022-10-02 | Disposition: A | Discharge status: Home / Self Care | Payer: 59 | Source: Ambulatory Visit | Attending: Internal Medicine | Admitting: Internal Medicine

## 2022-10-02 ENCOUNTER — Encounter: Payer: Self-pay | Admitting: Internal Medicine

## 2022-10-02 DIAGNOSIS — R918 Other nonspecific abnormal finding of lung field: Secondary | ICD-10-CM | POA: Diagnosis not present

## 2022-10-02 DIAGNOSIS — J984 Other disorders of lung: Secondary | ICD-10-CM | POA: Diagnosis not present

## 2022-10-02 DIAGNOSIS — E041 Nontoxic single thyroid nodule: Secondary | ICD-10-CM

## 2022-10-02 DIAGNOSIS — J189 Pneumonia, unspecified organism: Secondary | ICD-10-CM

## 2022-10-02 DIAGNOSIS — Z8701 Personal history of pneumonia (recurrent): Secondary | ICD-10-CM | POA: Diagnosis not present

## 2022-10-03 ENCOUNTER — Other Ambulatory Visit: Payer: Self-pay | Admitting: Internal Medicine

## 2022-10-03 DIAGNOSIS — E118 Type 2 diabetes mellitus with unspecified complications: Secondary | ICD-10-CM

## 2022-10-04 ENCOUNTER — Other Ambulatory Visit (HOSPITAL_COMMUNITY): Payer: Self-pay

## 2022-10-04 ENCOUNTER — Other Ambulatory Visit: Payer: Self-pay | Admitting: Internal Medicine

## 2022-10-04 DIAGNOSIS — E118 Type 2 diabetes mellitus with unspecified complications: Secondary | ICD-10-CM

## 2022-10-04 MED ORDER — TRULICITY 1.5 MG/0.5ML ~~LOC~~ SOAJ
1.5000 mg | SUBCUTANEOUS | 0 refills | Status: DC
  Filled ????-??-??: fill #0

## 2022-10-07 ENCOUNTER — Other Ambulatory Visit (HOSPITAL_COMMUNITY): Payer: Self-pay

## 2022-10-07 DIAGNOSIS — L218 Other seborrheic dermatitis: Secondary | ICD-10-CM | POA: Diagnosis not present

## 2022-10-07 MED ORDER — BETAMETHASONE DIPROPIONATE AUG 0.05 % EX LOTN
TOPICAL_LOTION | Freq: Every day | CUTANEOUS | 1 refills | Status: AC | PRN
  Filled 2022-10-07: qty 60, 30d supply, fill #0

## 2022-10-08 DIAGNOSIS — G245 Blepharospasm: Secondary | ICD-10-CM | POA: Diagnosis not present

## 2022-10-09 ENCOUNTER — Ambulatory Visit
Admission: RE | Admit: 2022-10-09 | Discharge: 2022-10-09 | Disposition: A | Discharge status: Home / Self Care | Payer: 59 | Source: Ambulatory Visit | Attending: Internal Medicine | Admitting: Internal Medicine

## 2022-10-09 ENCOUNTER — Other Ambulatory Visit: Payer: Self-pay | Admitting: Internal Medicine

## 2022-10-09 ENCOUNTER — Other Ambulatory Visit (HOSPITAL_COMMUNITY): Payer: Self-pay

## 2022-10-09 DIAGNOSIS — E041 Nontoxic single thyroid nodule: Secondary | ICD-10-CM

## 2022-10-09 DIAGNOSIS — R69 Illness, unspecified: Secondary | ICD-10-CM | POA: Diagnosis not present

## 2022-10-09 DIAGNOSIS — F419 Anxiety disorder, unspecified: Secondary | ICD-10-CM | POA: Diagnosis not present

## 2022-10-10 ENCOUNTER — Encounter: Payer: Self-pay | Admitting: Internal Medicine

## 2022-10-14 ENCOUNTER — Other Ambulatory Visit (HOSPITAL_COMMUNITY): Payer: Self-pay

## 2022-10-14 DIAGNOSIS — R69 Illness, unspecified: Secondary | ICD-10-CM | POA: Diagnosis not present

## 2022-10-14 MED ORDER — LORAZEPAM 1 MG PO TABS
1.0000 mg | ORAL_TABLET | Freq: Two times a day (BID) | ORAL | 3 refills | Status: DC
  Filled 2022-12-30: qty 75, 38d supply, fill #0

## 2022-10-16 ENCOUNTER — Other Ambulatory Visit (HOSPITAL_COMMUNITY): Payer: Self-pay

## 2022-10-23 ENCOUNTER — Other Ambulatory Visit (HOSPITAL_COMMUNITY): Payer: Self-pay

## 2022-10-29 ENCOUNTER — Ambulatory Visit: Payer: 59 | Admitting: Internal Medicine

## 2022-10-29 ENCOUNTER — Encounter: Payer: Self-pay | Admitting: Internal Medicine

## 2022-10-29 DIAGNOSIS — H6523 Chronic serous otitis media, bilateral: Secondary | ICD-10-CM | POA: Diagnosis not present

## 2022-10-29 DIAGNOSIS — Z9622 Myringotomy tube(s) status: Secondary | ICD-10-CM | POA: Diagnosis not present

## 2022-10-29 DIAGNOSIS — H6993 Unspecified Eustachian tube disorder, bilateral: Secondary | ICD-10-CM | POA: Diagnosis not present

## 2022-10-29 DIAGNOSIS — E041 Nontoxic single thyroid nodule: Secondary | ICD-10-CM | POA: Diagnosis not present

## 2022-10-30 ENCOUNTER — Other Ambulatory Visit: Payer: Self-pay | Admitting: Otolaryngology

## 2022-10-30 DIAGNOSIS — E041 Nontoxic single thyroid nodule: Secondary | ICD-10-CM

## 2022-11-02 ENCOUNTER — Encounter: Payer: Self-pay | Admitting: Internal Medicine

## 2022-11-04 ENCOUNTER — Other Ambulatory Visit: Payer: Self-pay | Admitting: Internal Medicine

## 2022-11-04 DIAGNOSIS — R053 Chronic cough: Secondary | ICD-10-CM | POA: Insufficient documentation

## 2022-11-05 DIAGNOSIS — G245 Blepharospasm: Secondary | ICD-10-CM | POA: Diagnosis not present

## 2022-11-06 ENCOUNTER — Other Ambulatory Visit (HOSPITAL_COMMUNITY): Payer: Self-pay

## 2022-11-07 ENCOUNTER — Other Ambulatory Visit (HOSPITAL_COMMUNITY)
Admission: RE | Admit: 2022-11-07 | Discharge: 2022-11-07 | Disposition: A | Discharge status: Home / Self Care | Payer: 59 | Source: Ambulatory Visit | Attending: Interventional Radiology | Admitting: Interventional Radiology

## 2022-11-07 ENCOUNTER — Ambulatory Visit
Admission: RE | Admit: 2022-11-07 | Discharge: 2022-11-07 | Disposition: A | Discharge status: Home / Self Care | Payer: 59 | Source: Ambulatory Visit | Attending: Otolaryngology | Admitting: Otolaryngology

## 2022-11-07 ENCOUNTER — Other Ambulatory Visit (HOSPITAL_COMMUNITY): Payer: Self-pay

## 2022-11-07 DIAGNOSIS — E041 Nontoxic single thyroid nodule: Secondary | ICD-10-CM | POA: Insufficient documentation

## 2022-11-07 DIAGNOSIS — F3181 Bipolar II disorder: Secondary | ICD-10-CM | POA: Diagnosis not present

## 2022-11-07 DIAGNOSIS — F419 Anxiety disorder, unspecified: Secondary | ICD-10-CM | POA: Diagnosis not present

## 2022-11-11 ENCOUNTER — Encounter: Payer: Self-pay | Admitting: Internal Medicine

## 2022-11-12 LAB — CYTOLOGY - NON PAP

## 2022-11-14 ENCOUNTER — Other Ambulatory Visit: Payer: Self-pay | Admitting: Internal Medicine

## 2022-11-14 ENCOUNTER — Other Ambulatory Visit (HOSPITAL_COMMUNITY): Payer: Self-pay

## 2022-11-14 DIAGNOSIS — F324 Major depressive disorder, single episode, in partial remission: Secondary | ICD-10-CM

## 2022-11-14 DIAGNOSIS — F319 Bipolar disorder, unspecified: Secondary | ICD-10-CM

## 2022-11-14 MED ORDER — QUETIAPINE FUMARATE 200 MG PO TABS
200.0000 mg | ORAL_TABLET | Freq: Every day | ORAL | 0 refills | Status: AC
  Filled 2022-11-14: qty 30, 30d supply, fill #0

## 2022-11-18 ENCOUNTER — Encounter: Payer: Self-pay | Admitting: Internal Medicine

## 2022-11-19 ENCOUNTER — Other Ambulatory Visit (HOSPITAL_COMMUNITY): Payer: Self-pay

## 2022-11-19 DIAGNOSIS — F419 Anxiety disorder, unspecified: Secondary | ICD-10-CM | POA: Diagnosis not present

## 2022-11-19 DIAGNOSIS — F3181 Bipolar II disorder: Secondary | ICD-10-CM | POA: Diagnosis not present

## 2022-11-20 ENCOUNTER — Other Ambulatory Visit: Payer: Self-pay | Admitting: Internal Medicine

## 2022-11-20 ENCOUNTER — Other Ambulatory Visit: Payer: Self-pay

## 2022-11-20 ENCOUNTER — Other Ambulatory Visit (HOSPITAL_COMMUNITY): Payer: Self-pay

## 2022-11-20 DIAGNOSIS — J301 Allergic rhinitis due to pollen: Secondary | ICD-10-CM

## 2022-11-20 DIAGNOSIS — I10 Essential (primary) hypertension: Secondary | ICD-10-CM

## 2022-11-20 MED ORDER — LEVOCETIRIZINE DIHYDROCHLORIDE 5 MG PO TABS
5.0000 mg | ORAL_TABLET | Freq: Every evening | ORAL | 0 refills | Status: DC
  Filled 2022-11-20: qty 30, 30d supply, fill #0
  Filled 2023-04-14: qty 30, 30d supply, fill #1

## 2022-11-20 MED ORDER — NEBIVOLOL HCL 5 MG PO TABS
5.0000 mg | ORAL_TABLET | Freq: Every day | ORAL | 0 refills | Status: DC
  Filled 2022-11-20: qty 30, 30d supply, fill #0
  Filled 2023-01-01: qty 30, 30d supply, fill #1
  Filled 2023-01-30: qty 30, 30d supply, fill #2

## 2022-11-21 ENCOUNTER — Encounter: Payer: Self-pay | Admitting: Internal Medicine

## 2022-11-21 ENCOUNTER — Ambulatory Visit: Payer: 59 | Admitting: Internal Medicine

## 2022-11-21 ENCOUNTER — Other Ambulatory Visit (HOSPITAL_COMMUNITY): Payer: Self-pay

## 2022-11-21 VITALS — BP 134/82 | HR 78 | Temp 98.1°F | Ht 62.0 in | Wt 239.0 lb

## 2022-11-21 DIAGNOSIS — R0602 Shortness of breath: Secondary | ICD-10-CM | POA: Diagnosis not present

## 2022-11-21 DIAGNOSIS — G245 Blepharospasm: Secondary | ICD-10-CM | POA: Diagnosis not present

## 2022-11-21 DIAGNOSIS — J301 Allergic rhinitis due to pollen: Secondary | ICD-10-CM

## 2022-11-21 DIAGNOSIS — G244 Idiopathic orofacial dystonia: Secondary | ICD-10-CM

## 2022-11-21 DIAGNOSIS — T17908A Unspecified foreign body in respiratory tract, part unspecified causing other injury, initial encounter: Secondary | ICD-10-CM

## 2022-11-21 MED ORDER — FLUTICASONE PROPIONATE 50 MCG/ACT NA SUSP
1.0000 | Freq: Every day | NASAL | 11 refills | Status: AC
  Filled 2022-11-21: qty 16, 30d supply, fill #0
  Filled 2023-04-08: qty 16, 30d supply, fill #1

## 2022-11-21 NOTE — Patient Instructions (Addendum)
I think your coughing related to eating and your shortness of breath symptoms are related to your dystonic movements of the face and jaw. Please follow up with your neurologist regarding these movements.  I do not see any evidence of chronic lung disease on your CT scan, and your symptoms do not suggest this.   Continue xyzal and flonase for your seasonal allergies.  Please call me sooner if symptoms worsen or change.  Flonase - 1 spray on each side of your nose twice a day for first week, then 1 spray on each side.   Instructions for use: If you also use a saline nasal spray or rinse, use that first. Position the head with the chin slightly tucked. Use the right hand to spray into the left nostril and the right hand to spray into the left nostril.   Point the bottle away from the septum of your nose (cartilage that divides the two sides of your nose).  Hold the nostril closed on the opposite side from where you will spray Spray once and gently sniff to pull the medicine into the higher parts of your nose.  Don't sniff too hard as the medicine will drain down the back of your throat instead. Repeat with a second spray on the same side if prescribed. Repeat on the other side of your nose.

## 2022-11-21 NOTE — Progress Notes (Signed)
Kaylee Hill    [id number]    [birth year]  Primary Care Physician:Jones, Arvid Right, MD  Referring Physician: Janith Lima, MD [address] [place],  [place] [address] Reason for Consultation: chronic cough Date of Consultation: 11/21/2022  Chief complaint:   Chief Complaint  Patient presents with   Consult    Chronic cough occurs when eating      HPI: Kaylee Hill is a 42 y.o. woman who presents for new patient evaluation of chronic cough. She was diagnosed in 2023 with blepharospasm  Symptoms started She notes every time she eats now she has episodes of food and drink aspiration accompanied with coughing and shortness of breath. She does not cough outside these episodes.    Dr. Leonard Schwartz at [facility] has been doing botox injections for the orbicularis oculi muscle Dr. Manuella Ghazi at [facility]   She does have dyspnea with exertion with jaw and mouth spasms.   She did have some recurrent bronchitis as a child and was told she had environmental allergies. She does have seasonal allergies and started taking xyzal.   In 2023 she had an episode of pneumonia in 2023. She was given an albuterol inhaler. This did help her walk a little further. She has taken breathing treatments in the past and they have only helped for about ten minutes.     Social history:  Occupation: previously as Tour manager Exposures: lives at home with husband and daughter, two dogs.  Smoking history: remote   Social History   Occupational History   Occupation: Marine scientist: [employer]  Tobacco Use   Smoking status: Former    Packs/day: 0.10    Years: 1.00    Additional pack years: 0.00    Total pack years: 0.10    Types: Cigarettes    Quit date: 2000    Years since quitting: 24.2    Passive exposure: Never   Smokeless tobacco: Never  Vaping Use   Vaping Use: Never used  Substance and Sexual Activity   Alcohol use: No   Drug use: No   Sexual activity: Not  Currently    Partners: Male    Birth control/protection: Condom    Relevant family history:  Family History  Problem Relation Age of Onset   Bipolar disorder Mother    Diabetes Father    Hypertension Father    Schizophrenia Father    Asthma Brother    Healthy Daughter    Diabetes Maternal Grandmother    Cancer Maternal Grandmother        breast, pancreatic cancer   Dementia Maternal Grandfather     Past Medical History:  Diagnosis Date   Anxiety    Bipolar 1 disorder ([place])    Depression    Gestational diabetes    Headache    Hypertension     Past Surgical History:  Procedure Laterality Date   BREAST SURGERY       Physical Exam: Blood pressure 134/82, pulse 78, temperature 98.1 F (36.7 C), temperature source Oral, height 5\' 2"  (1.575 m), weight 239 lb (108.4 kg), SpO2 94 %. Gen:      No acute distress ENT:  near constant blepharospasm, mmm, no polyps Lungs:    No increased respiratory effort, symmetric chest wall excursion, clear to auscultation bilaterally, no wheezes or crackles CV:         Regular rate and rhythm; no murmurs, rubs, or gallops.  No pedal edema Abd:      + bowel sounds; soft, non-tender; no distension MSK: no acute synovitis of DIP or PIP joints, no mechanics hands.  Skin:      Warm and dry; no rashes Neuro: speech normal but is interrupted with her facial dystonia. She has frequent twitching movements of the mouth and jaw. No tongue thrusting. No tremor Psych: alert and oriented x3, normal mood and affect   Data Reviewed/Medical Decision Making:  Independent interpretation of tests: Imaging:  Review of patient's CT Chest Jan 2024 images revealed mild peribronchial thickening which can be seen in asthma, some resolving atelectasis from prior infection. The patient's images have been independently reviewed by me.    PFTs:   Labs:  Lab Results  Component Value Date   WBC 11.5 (H) 09/25/2022   HGB 10.9 (L) 09/25/2022   HCT 33.8 (L)  09/25/2022   MCV 73.6 (L) 09/25/2022   PLT 335 09/25/2022   Lab Results  Component Value Date   NA 138 09/25/2022   K 3.8 09/25/2022   CO2 29 09/25/2022   GLUCOSE 123 (H) 09/25/2022   BUN <5 (L) 09/25/2022   CREATININE 0.58 09/25/2022   CALCIUM 9.6 09/25/2022   EGFR 113 10/23/2021   GFRNONAA >60 09/25/2022      Immunization status:  Immunization History  Administered Date(s) Administered   Hepatitis B, ADULT 07/21/2014   Influenza Whole 08/02/2013, 05/17/2014   Influenza,inj,Quad PF,6+ Mos 05/19/2015, 04/25/2017   Influenza-Unspecified 08/02/2014   Meningococcal polysaccharide vaccine (MPSV4) 07/21/2014   PFIZER(Purple Top)SARS-COV-2 Vaccination 11/01/2019, 12/02/2019   PNEUMOCOCCAL CONJUGATE-20 02/05/2021   Pneumococcal Polysaccharide-23 01/17/2020   Tdap 04/09/2013, 04/18/2015     I reviewed prior external note(s) from neurology, pcp, ENT  I reviewed the result(s) of the labs and imaging as noted above.   I have ordered    Assessment:  Blepharospasm and oro-facial dystonia Cough with eating  Dyspnea Allergic rhinitis  Plan/Recommendations:  Ms. Mcquillan has unfortunate symptoms of dystonia which seem to be affecting her quality of life substantially. The symptoms of coughing while eating seem consistent with aspiration with food and drink. She does not cough when not eating. She has dyspnea which comes at rest and exertion and seems abrupt. It is not improved by albuterol. Her CT scan is reassuring for an absence of chronic lung disease. Based on the degree of dystonia she has around her mouth I do not think she'd be able to perform PFT maneuvers. I recommend follow up with her neurologist to address these symptoms as much as possible. She has had botox injections but it's unclear how helpful they've been so far.   For her rhinitis we can add flonase to her xyzal.  I am happy to see her back if symptoms worse or change, or if they persist despite improvement of her  dystonia. At present I doubt her current symptoms are related to chronic lung disease.   We discussed disease management and progression at length today.   I spent 45 minutes in the care of this patient today including pre-charting, chart review, review of results, face-to-face care, coordination of care and communication with consultants etc.).   Return to Care: Return if symptoms worsen or fail to improve.  Lenice Llamas, MD Pulmonary and [place]  CC: Janith Lima, MD

## 2022-11-22 ENCOUNTER — Other Ambulatory Visit (HOSPITAL_COMMUNITY): Payer: Self-pay

## 2022-11-23 ENCOUNTER — Other Ambulatory Visit (HOSPITAL_COMMUNITY): Payer: Self-pay

## 2022-11-25 ENCOUNTER — Other Ambulatory Visit (HOSPITAL_COMMUNITY): Payer: Self-pay

## 2022-11-28 ENCOUNTER — Encounter (INDEPENDENT_AMBULATORY_CARE_PROVIDER_SITE_OTHER): Payer: No Typology Code available for payment source | Admitting: Ophthalmology

## 2022-12-11 DIAGNOSIS — G245 Blepharospasm: Secondary | ICD-10-CM | POA: Diagnosis not present

## 2022-12-11 DIAGNOSIS — F319 Bipolar disorder, unspecified: Secondary | ICD-10-CM | POA: Diagnosis not present

## 2022-12-12 DIAGNOSIS — F419 Anxiety disorder, unspecified: Secondary | ICD-10-CM | POA: Diagnosis not present

## 2022-12-12 DIAGNOSIS — F3181 Bipolar II disorder: Secondary | ICD-10-CM | POA: Diagnosis not present

## 2022-12-18 ENCOUNTER — Other Ambulatory Visit (HOSPITAL_COMMUNITY): Payer: Self-pay

## 2022-12-18 DIAGNOSIS — F3131 Bipolar disorder, current episode depressed, mild: Secondary | ICD-10-CM | POA: Diagnosis not present

## 2022-12-18 DIAGNOSIS — G2401 Drug induced subacute dyskinesia: Secondary | ICD-10-CM | POA: Diagnosis not present

## 2022-12-18 MED ORDER — CARBAMAZEPINE 200 MG PO TABS
200.0000 mg | ORAL_TABLET | ORAL | 1 refills | Status: DC
  Filled 2022-12-18: qty 120, 30d supply, fill #0
  Filled 2022-12-18: qty 63, 30d supply, fill #0
  Filled 2023-01-30: qty 120, 30d supply, fill #1

## 2022-12-18 MED ORDER — LORAZEPAM 1 MG PO TABS
2.5000 mg | ORAL_TABLET | Freq: Every day | ORAL | 1 refills | Status: DC
  Filled 2022-12-18 – 2022-12-30 (×2): qty 75, 30d supply, fill #0
  Filled 2023-01-30: qty 75, 30d supply, fill #1

## 2022-12-18 MED ORDER — QUETIAPINE FUMARATE 100 MG PO TABS
100.0000 mg | ORAL_TABLET | Freq: Two times a day (BID) | ORAL | 1 refills | Status: DC
  Filled 2022-12-18: qty 30, 15d supply, fill #0
  Filled 2023-01-01: qty 30, 15d supply, fill #1

## 2022-12-18 MED ORDER — AUSTEDO 9 MG PO TABS
9.0000 mg | ORAL_TABLET | Freq: Two times a day (BID) | ORAL | 5 refills | Status: AC
  Filled 2022-12-18: qty 60, 30d supply, fill #0

## 2022-12-19 DIAGNOSIS — G245 Blepharospasm: Secondary | ICD-10-CM | POA: Diagnosis not present

## 2022-12-24 DIAGNOSIS — F3181 Bipolar II disorder: Secondary | ICD-10-CM | POA: Diagnosis not present

## 2022-12-24 DIAGNOSIS — F419 Anxiety disorder, unspecified: Secondary | ICD-10-CM | POA: Diagnosis not present

## 2022-12-30 ENCOUNTER — Other Ambulatory Visit (HOSPITAL_COMMUNITY): Payer: Self-pay

## 2022-12-31 ENCOUNTER — Other Ambulatory Visit: Payer: Self-pay | Admitting: Internal Medicine

## 2022-12-31 DIAGNOSIS — E118 Type 2 diabetes mellitus with unspecified complications: Secondary | ICD-10-CM

## 2022-12-31 MED ORDER — METFORMIN HCL ER 750 MG PO TB24
1500.0000 mg | ORAL_TABLET | Freq: Every day | ORAL | 0 refills | Status: DC
  Filled 2022-12-31: qty 60, 30d supply, fill #0
  Filled 2023-01-30: qty 60, 30d supply, fill #1
  Filled 2023-04-14: qty 60, 30d supply, fill #2

## 2023-01-01 ENCOUNTER — Other Ambulatory Visit (HOSPITAL_COMMUNITY): Payer: Self-pay

## 2023-01-06 ENCOUNTER — Other Ambulatory Visit (HOSPITAL_COMMUNITY): Payer: Self-pay

## 2023-01-06 DIAGNOSIS — L218 Other seborrheic dermatitis: Secondary | ICD-10-CM | POA: Diagnosis not present

## 2023-01-06 MED ORDER — KETOCONAZOLE 2 % EX SHAM
1.0000 | MEDICATED_SHAMPOO | CUTANEOUS | 1 refills | Status: AC
  Filled 2023-01-06: qty 120, 30d supply, fill #0

## 2023-01-06 MED ORDER — KETOCONAZOLE 2 % EX CREA
1.0000 | TOPICAL_CREAM | CUTANEOUS | 3 refills | Status: AC
  Filled 2023-01-06: qty 30, 30d supply, fill #0

## 2023-01-08 DIAGNOSIS — F419 Anxiety disorder, unspecified: Secondary | ICD-10-CM | POA: Diagnosis not present

## 2023-01-08 DIAGNOSIS — F3181 Bipolar II disorder: Secondary | ICD-10-CM | POA: Diagnosis not present

## 2023-01-17 ENCOUNTER — Other Ambulatory Visit (HOSPITAL_COMMUNITY): Payer: Self-pay

## 2023-01-21 ENCOUNTER — Other Ambulatory Visit: Payer: Self-pay

## 2023-01-22 DIAGNOSIS — F419 Anxiety disorder, unspecified: Secondary | ICD-10-CM | POA: Diagnosis not present

## 2023-01-22 DIAGNOSIS — F3181 Bipolar II disorder: Secondary | ICD-10-CM | POA: Diagnosis not present

## 2023-01-24 ENCOUNTER — Other Ambulatory Visit (HOSPITAL_COMMUNITY): Payer: Self-pay

## 2023-01-24 MED ORDER — QUETIAPINE FUMARATE 100 MG PO TABS
100.0000 mg | ORAL_TABLET | Freq: Two times a day (BID) | ORAL | 1 refills | Status: DC
  Filled 2023-01-24: qty 60, 30d supply, fill #0
  Filled 2023-02-27: qty 60, 30d supply, fill #1

## 2023-01-30 ENCOUNTER — Other Ambulatory Visit (HOSPITAL_COMMUNITY): Payer: Self-pay

## 2023-02-05 DIAGNOSIS — F419 Anxiety disorder, unspecified: Secondary | ICD-10-CM | POA: Diagnosis not present

## 2023-02-05 DIAGNOSIS — F3181 Bipolar II disorder: Secondary | ICD-10-CM | POA: Diagnosis not present

## 2023-02-09 DIAGNOSIS — R4789 Other speech disturbances: Secondary | ICD-10-CM | POA: Diagnosis not present

## 2023-02-09 DIAGNOSIS — M62838 Other muscle spasm: Secondary | ICD-10-CM | POA: Diagnosis not present

## 2023-02-09 DIAGNOSIS — R0602 Shortness of breath: Secondary | ICD-10-CM | POA: Diagnosis not present

## 2023-02-10 DIAGNOSIS — I10 Essential (primary) hypertension: Secondary | ICD-10-CM | POA: Diagnosis not present

## 2023-02-10 DIAGNOSIS — R0602 Shortness of breath: Secondary | ICD-10-CM | POA: Diagnosis not present

## 2023-02-10 DIAGNOSIS — J343 Hypertrophy of nasal turbinates: Secondary | ICD-10-CM | POA: Diagnosis not present

## 2023-02-10 DIAGNOSIS — K219 Gastro-esophageal reflux disease without esophagitis: Secondary | ICD-10-CM | POA: Diagnosis not present

## 2023-02-10 DIAGNOSIS — E119 Type 2 diabetes mellitus without complications: Secondary | ICD-10-CM | POA: Diagnosis not present

## 2023-02-10 DIAGNOSIS — R1310 Dysphagia, unspecified: Secondary | ICD-10-CM | POA: Diagnosis not present

## 2023-02-10 DIAGNOSIS — Z91048 Other nonmedicinal substance allergy status: Secondary | ICD-10-CM | POA: Diagnosis not present

## 2023-02-10 DIAGNOSIS — M62838 Other muscle spasm: Secondary | ICD-10-CM | POA: Diagnosis not present

## 2023-02-10 DIAGNOSIS — G244 Idiopathic orofacial dystonia: Secondary | ICD-10-CM | POA: Diagnosis not present

## 2023-02-10 DIAGNOSIS — Z7984 Long term (current) use of oral hypoglycemic drugs: Secondary | ICD-10-CM | POA: Diagnosis not present

## 2023-02-10 DIAGNOSIS — R4789 Other speech disturbances: Secondary | ICD-10-CM | POA: Diagnosis not present

## 2023-02-10 DIAGNOSIS — Z8701 Personal history of pneumonia (recurrent): Secondary | ICD-10-CM | POA: Diagnosis not present

## 2023-02-10 DIAGNOSIS — J383 Other diseases of vocal cords: Secondary | ICD-10-CM | POA: Diagnosis not present

## 2023-02-10 DIAGNOSIS — Z79899 Other long term (current) drug therapy: Secondary | ICD-10-CM | POA: Diagnosis not present

## 2023-02-10 DIAGNOSIS — F250 Schizoaffective disorder, bipolar type: Secondary | ICD-10-CM | POA: Diagnosis not present

## 2023-02-10 DIAGNOSIS — G2401 Drug induced subacute dyskinesia: Secondary | ICD-10-CM | POA: Diagnosis not present

## 2023-02-10 DIAGNOSIS — E7849 Other hyperlipidemia: Secondary | ICD-10-CM | POA: Diagnosis not present

## 2023-02-10 DIAGNOSIS — Q820 Hereditary lymphedema: Secondary | ICD-10-CM | POA: Diagnosis not present

## 2023-02-13 ENCOUNTER — Other Ambulatory Visit (HOSPITAL_COMMUNITY): Payer: Self-pay

## 2023-02-13 DIAGNOSIS — R4789 Other speech disturbances: Secondary | ICD-10-CM | POA: Diagnosis not present

## 2023-02-13 DIAGNOSIS — G4733 Obstructive sleep apnea (adult) (pediatric): Secondary | ICD-10-CM | POA: Diagnosis not present

## 2023-02-14 ENCOUNTER — Other Ambulatory Visit (HOSPITAL_COMMUNITY): Payer: Self-pay

## 2023-02-14 MED ORDER — TRIHEXYPHENIDYL HCL 2 MG PO TABS
1.0000 mg | ORAL_TABLET | Freq: Two times a day (BID) | ORAL | 3 refills | Status: AC
  Filled 2023-02-14: qty 30, 30d supply, fill #0

## 2023-02-14 MED ORDER — AMLODIPINE BESYLATE 5 MG PO TABS
5.0000 mg | ORAL_TABLET | Freq: Every day | ORAL | 3 refills | Status: AC
  Filled 2023-02-14 – 2023-03-14 (×2): qty 30, 30d supply, fill #0
  Filled 2023-04-08: qty 30, 30d supply, fill #1

## 2023-02-15 DIAGNOSIS — G245 Blepharospasm: Secondary | ICD-10-CM | POA: Diagnosis not present

## 2023-02-15 DIAGNOSIS — Z7984 Long term (current) use of oral hypoglycemic drugs: Secondary | ICD-10-CM | POA: Diagnosis not present

## 2023-02-15 DIAGNOSIS — D649 Anemia, unspecified: Secondary | ICD-10-CM | POA: Diagnosis not present

## 2023-02-15 DIAGNOSIS — F319 Bipolar disorder, unspecified: Secondary | ICD-10-CM | POA: Diagnosis not present

## 2023-02-15 DIAGNOSIS — I10 Essential (primary) hypertension: Secondary | ICD-10-CM | POA: Diagnosis not present

## 2023-02-15 DIAGNOSIS — E7849 Other hyperlipidemia: Secondary | ICD-10-CM | POA: Diagnosis not present

## 2023-02-15 DIAGNOSIS — E119 Type 2 diabetes mellitus without complications: Secondary | ICD-10-CM | POA: Diagnosis not present

## 2023-02-15 DIAGNOSIS — E785 Hyperlipidemia, unspecified: Secondary | ICD-10-CM | POA: Diagnosis not present

## 2023-02-15 DIAGNOSIS — G244 Idiopathic orofacial dystonia: Secondary | ICD-10-CM | POA: Diagnosis not present

## 2023-02-15 DIAGNOSIS — F250 Schizoaffective disorder, bipolar type: Secondary | ICD-10-CM | POA: Diagnosis not present

## 2023-02-15 DIAGNOSIS — J392 Other diseases of pharynx: Secondary | ICD-10-CM | POA: Diagnosis not present

## 2023-02-15 DIAGNOSIS — D573 Sickle-cell trait: Secondary | ICD-10-CM | POA: Diagnosis not present

## 2023-02-15 DIAGNOSIS — M62838 Other muscle spasm: Secondary | ICD-10-CM | POA: Diagnosis not present

## 2023-02-17 ENCOUNTER — Other Ambulatory Visit (HOSPITAL_COMMUNITY): Payer: Self-pay

## 2023-02-17 ENCOUNTER — Telehealth: Payer: Self-pay | Admitting: Internal Medicine

## 2023-02-17 NOTE — Telephone Encounter (Signed)
Verbal orders given via VM 

## 2023-02-17 NOTE — Telephone Encounter (Signed)
Caller [employer] What [employer]:  Kaylee Hill with [facility]-   Phone Number:[phone] (confidential)   Needs Verbal orders for what service & frequency:  PHYSICAL THERAPY 2 x a week for 4 weeks, then 1 time a week for 4 weeks

## 2023-02-18 DIAGNOSIS — G245 Blepharospasm: Secondary | ICD-10-CM | POA: Diagnosis not present

## 2023-02-18 DIAGNOSIS — J392 Other diseases of pharynx: Secondary | ICD-10-CM | POA: Diagnosis not present

## 2023-02-18 DIAGNOSIS — E119 Type 2 diabetes mellitus without complications: Secondary | ICD-10-CM | POA: Diagnosis not present

## 2023-02-18 DIAGNOSIS — F319 Bipolar disorder, unspecified: Secondary | ICD-10-CM | POA: Diagnosis not present

## 2023-02-18 DIAGNOSIS — D649 Anemia, unspecified: Secondary | ICD-10-CM | POA: Diagnosis not present

## 2023-02-18 DIAGNOSIS — G244 Idiopathic orofacial dystonia: Secondary | ICD-10-CM | POA: Diagnosis not present

## 2023-02-18 DIAGNOSIS — D573 Sickle-cell trait: Secondary | ICD-10-CM | POA: Diagnosis not present

## 2023-02-18 DIAGNOSIS — Z7984 Long term (current) use of oral hypoglycemic drugs: Secondary | ICD-10-CM | POA: Diagnosis not present

## 2023-02-18 DIAGNOSIS — I10 Essential (primary) hypertension: Secondary | ICD-10-CM | POA: Diagnosis not present

## 2023-02-18 DIAGNOSIS — E785 Hyperlipidemia, unspecified: Secondary | ICD-10-CM | POA: Diagnosis not present

## 2023-02-18 DIAGNOSIS — E7849 Other hyperlipidemia: Secondary | ICD-10-CM | POA: Diagnosis not present

## 2023-02-18 DIAGNOSIS — F250 Schizoaffective disorder, bipolar type: Secondary | ICD-10-CM | POA: Diagnosis not present

## 2023-02-18 DIAGNOSIS — M62838 Other muscle spasm: Secondary | ICD-10-CM | POA: Diagnosis not present

## 2023-02-19 DIAGNOSIS — F419 Anxiety disorder, unspecified: Secondary | ICD-10-CM | POA: Diagnosis not present

## 2023-02-19 DIAGNOSIS — F3181 Bipolar II disorder: Secondary | ICD-10-CM | POA: Diagnosis not present

## 2023-02-20 DIAGNOSIS — D649 Anemia, unspecified: Secondary | ICD-10-CM | POA: Diagnosis not present

## 2023-02-20 DIAGNOSIS — E785 Hyperlipidemia, unspecified: Secondary | ICD-10-CM | POA: Diagnosis not present

## 2023-02-20 DIAGNOSIS — D573 Sickle-cell trait: Secondary | ICD-10-CM | POA: Diagnosis not present

## 2023-02-20 DIAGNOSIS — J392 Other diseases of pharynx: Secondary | ICD-10-CM | POA: Diagnosis not present

## 2023-02-20 DIAGNOSIS — M62838 Other muscle spasm: Secondary | ICD-10-CM | POA: Diagnosis not present

## 2023-02-20 DIAGNOSIS — F250 Schizoaffective disorder, bipolar type: Secondary | ICD-10-CM | POA: Diagnosis not present

## 2023-02-20 DIAGNOSIS — E7849 Other hyperlipidemia: Secondary | ICD-10-CM | POA: Diagnosis not present

## 2023-02-20 DIAGNOSIS — G244 Idiopathic orofacial dystonia: Secondary | ICD-10-CM | POA: Diagnosis not present

## 2023-02-20 DIAGNOSIS — G245 Blepharospasm: Secondary | ICD-10-CM | POA: Diagnosis not present

## 2023-02-20 DIAGNOSIS — I10 Essential (primary) hypertension: Secondary | ICD-10-CM | POA: Diagnosis not present

## 2023-02-20 DIAGNOSIS — E119 Type 2 diabetes mellitus without complications: Secondary | ICD-10-CM | POA: Diagnosis not present

## 2023-02-20 DIAGNOSIS — Z7984 Long term (current) use of oral hypoglycemic drugs: Secondary | ICD-10-CM | POA: Diagnosis not present

## 2023-02-20 DIAGNOSIS — F319 Bipolar disorder, unspecified: Secondary | ICD-10-CM | POA: Diagnosis not present

## 2023-02-21 DIAGNOSIS — R490 Dysphonia: Secondary | ICD-10-CM | POA: Diagnosis not present

## 2023-02-21 DIAGNOSIS — G244 Idiopathic orofacial dystonia: Secondary | ICD-10-CM | POA: Diagnosis not present

## 2023-02-21 DIAGNOSIS — R1314 Dysphagia, pharyngoesophageal phase: Secondary | ICD-10-CM | POA: Diagnosis not present

## 2023-02-25 ENCOUNTER — Ambulatory Visit (INDEPENDENT_AMBULATORY_CARE_PROVIDER_SITE_OTHER): Payer: 59 | Admitting: Internal Medicine

## 2023-02-25 ENCOUNTER — Encounter: Payer: Self-pay | Admitting: Internal Medicine

## 2023-02-25 ENCOUNTER — Other Ambulatory Visit (HOSPITAL_COMMUNITY): Payer: Self-pay

## 2023-02-25 VITALS — BP 136/88 | HR 96 | Temp 98.2°F | Ht 62.0 in | Wt 234.0 lb

## 2023-02-25 DIAGNOSIS — E1169 Type 2 diabetes mellitus with other specified complication: Secondary | ICD-10-CM

## 2023-02-25 DIAGNOSIS — D538 Other specified nutritional anemias: Secondary | ICD-10-CM

## 2023-02-25 DIAGNOSIS — E785 Hyperlipidemia, unspecified: Secondary | ICD-10-CM

## 2023-02-25 DIAGNOSIS — D500 Iron deficiency anemia secondary to blood loss (chronic): Secondary | ICD-10-CM | POA: Diagnosis not present

## 2023-02-25 DIAGNOSIS — D649 Anemia, unspecified: Secondary | ICD-10-CM | POA: Diagnosis not present

## 2023-02-25 DIAGNOSIS — D573 Sickle-cell trait: Secondary | ICD-10-CM | POA: Diagnosis not present

## 2023-02-25 DIAGNOSIS — M6240 Contracture of muscle, unspecified site: Secondary | ICD-10-CM | POA: Diagnosis not present

## 2023-02-25 DIAGNOSIS — M62838 Other muscle spasm: Secondary | ICD-10-CM | POA: Diagnosis not present

## 2023-02-25 DIAGNOSIS — E538 Deficiency of other specified B group vitamins: Secondary | ICD-10-CM

## 2023-02-25 DIAGNOSIS — Z124 Encounter for screening for malignant neoplasm of cervix: Secondary | ICD-10-CM

## 2023-02-25 DIAGNOSIS — E118 Type 2 diabetes mellitus with unspecified complications: Secondary | ICD-10-CM | POA: Diagnosis not present

## 2023-02-25 DIAGNOSIS — J392 Other diseases of pharynx: Secondary | ICD-10-CM | POA: Diagnosis not present

## 2023-02-25 DIAGNOSIS — G244 Idiopathic orofacial dystonia: Secondary | ICD-10-CM | POA: Diagnosis not present

## 2023-02-25 DIAGNOSIS — Z7984 Long term (current) use of oral hypoglycemic drugs: Secondary | ICD-10-CM | POA: Diagnosis not present

## 2023-02-25 DIAGNOSIS — R319 Hematuria, unspecified: Secondary | ICD-10-CM

## 2023-02-25 DIAGNOSIS — I10 Essential (primary) hypertension: Secondary | ICD-10-CM

## 2023-02-25 DIAGNOSIS — F319 Bipolar disorder, unspecified: Secondary | ICD-10-CM | POA: Diagnosis not present

## 2023-02-25 DIAGNOSIS — G245 Blepharospasm: Secondary | ICD-10-CM | POA: Diagnosis not present

## 2023-02-25 DIAGNOSIS — E119 Type 2 diabetes mellitus without complications: Secondary | ICD-10-CM | POA: Diagnosis not present

## 2023-02-25 DIAGNOSIS — F250 Schizoaffective disorder, bipolar type: Secondary | ICD-10-CM | POA: Diagnosis not present

## 2023-02-25 DIAGNOSIS — E66813 Obesity, class 3: Secondary | ICD-10-CM

## 2023-02-25 DIAGNOSIS — E7849 Other hyperlipidemia: Secondary | ICD-10-CM

## 2023-02-25 LAB — BASIC METABOLIC PANEL
BUN: 7 mg/dL (ref 6–23)
CO2: 29 mEq/L (ref 19–32)
Calcium: 9.3 mg/dL (ref 8.4–10.5)
Chloride: 99 mEq/L (ref 96–112)
Creatinine, Ser: 0.54 mg/dL (ref 0.40–1.20)
GFR: 112.28 mL/min (ref >60.00)
Glucose, Bld: 201 mg/dL — ABNORMAL HIGH (ref 70–99)
Potassium: 4.2 mEq/L (ref 3.5–5.1)
Sodium: 136 mEq/L (ref 135–145)

## 2023-02-25 LAB — CBC WITH DIFFERENTIAL/PLATELET
Basophils Absolute: 0 10*3/uL (ref 0.0–0.1)
Basophils Relative: 0.5 % (ref 0.0–3.0)
Eosinophils Absolute: 0.2 10*3/uL (ref 0.0–0.7)
Eosinophils Relative: 2.8 % (ref 0.0–5.0)
HCT: 37.5 % (ref 36.0–46.0)
Hemoglobin: 11.9 g/dL — ABNORMAL LOW (ref 12.0–15.0)
Lymphocytes Relative: 29.8 % (ref 12.0–46.0)
Lymphs Abs: 1.7 10*3/uL (ref 0.7–4.0)
MCHC: 31.7 g/dL (ref 30.0–36.0)
MCV: 74 fl — ABNORMAL LOW (ref 78.0–100.0)
Monocytes Absolute: 0.5 10*3/uL (ref 0.1–1.0)
Monocytes Relative: 9 % (ref 3.0–12.0)
Neutro Abs: 3.3 10*3/uL (ref 1.4–7.7)
Neutrophils Relative %: 57.9 % (ref 43.0–77.0)
Platelets: 262 10*3/uL (ref 150.0–400.0)
RBC: 5.07 Mil/uL (ref 3.87–5.11)
RDW: 16.3 % — ABNORMAL HIGH (ref 11.5–15.5)
WBC: 5.7 10*3/uL (ref 4.0–10.5)

## 2023-02-25 LAB — IBC + FERRITIN
Ferritin: 42.5 ng/mL (ref 10.0–291.0)
Iron: 77 ug/dL (ref 42–145)
Saturation Ratios: 19.6 % — ABNORMAL LOW (ref 20.0–50.0)
TIBC: 393.4 ug/dL (ref 250.0–450.0)
Transferrin: 281 mg/dL (ref 212.0–360.0)

## 2023-02-25 LAB — LDL CHOLESTEROL, DIRECT: Direct LDL: 77 mg/dL

## 2023-02-25 LAB — HEPATIC FUNCTION PANEL
ALT: 17 U/L (ref 0–35)
AST: 18 U/L (ref 0–37)
Albumin: 4 g/dL (ref 3.5–5.2)
Alkaline Phosphatase: 79 U/L (ref 39–117)
Bilirubin, Direct: 0 mg/dL (ref 0.0–0.3)
Total Bilirubin: 0.3 mg/dL (ref 0.2–1.2)
Total Protein: 7.6 g/dL (ref 6.0–8.3)

## 2023-02-25 LAB — MICROALBUMIN / CREATININE URINE RATIO
Creatinine,U: 107.9 mg/dL
Microalb Creat Ratio: 0.6 mg/g (ref 0.0–30.0)
Microalb, Ur: <0.7 mg/dL (ref 0.0–1.9)

## 2023-02-25 LAB — VITAMIN B12: Vitamin B-12: 660 pg/mL (ref 211–911)

## 2023-02-25 LAB — TSH: TSH: 1.13 u[IU]/mL (ref 0.35–5.50)

## 2023-02-25 LAB — LIPID PANEL
Cholesterol: 249 mg/dL — ABNORMAL HIGH (ref 0–200)
HDL: 29.7 mg/dL — ABNORMAL LOW (ref >39.00)
Total CHOL/HDL Ratio: 8
Triglycerides: 406 mg/dL — ABNORMAL HIGH (ref 0.0–149.0)

## 2023-02-25 LAB — HEMOGLOBIN A1C: Hgb A1c MFr Bld: 7.6 % — ABNORMAL HIGH (ref 4.6–6.5)

## 2023-02-25 MED ORDER — ZINC GLUCONATE 50 MG PO TABS
50.0000 mg | ORAL_TABLET | Freq: Every day | ORAL | 1 refills | Status: AC
Start: 2023-02-25 — End: ?
  Filled 2023-02-25: qty 90, 90d supply, fill #0

## 2023-02-25 NOTE — Patient Instructions (Signed)

## 2023-02-25 NOTE — Progress Notes (Unsigned)
Subjective:  Patient ID: Kaylee Hill, female    DOB: [birth year]  Age: 42 y.o. MRN: [id number]  CC: Hypertension, Hyperlipidemia, and Diabetes   HPI AVIANCE LANDRON presents for f/up ---  Discussed the use of AI scribe software for clinical note transcription with the patient, who gave verbal consent to proceed.  History of Present Illness   The patient, with a complex neurological condition, presented to the [facility] two weeks ago due to severe dysphagia, to the point of choking on her own saliva. This was a frightening experience for both the patient and her spouse. Initially, the diagnosis was thought to be dystonia of the jaw and throat.  Upon discharge, the patient consulted with an otolaryngologist who suggested that the issue was not dystonia, but rather a complex speech disorder. The patient was given a mouth distractor device by a previous speech pathologist, which has reportedly helped with breathing and jaw spasms.  During her hospital stay, the patient experienced chest pain and shortness of breath, which prompted an ECG. The results were reported as abnormal, but the tracing was technically bad. The patient was unsure if movement during the EKG could have affected the results. Currently, the patient experiences shortness of breath when moving around, which is believed to be related to her jaw condition.   The patient is a nonsmoker and nondrinker. Her last menstrual cycle started the day before the consultation, and she reports regular cycles. The patient's last eye exam was in 2022.       Outpatient Medications Prior to Visit  Medication Sig Dispense Refill   amLODipine (NORVASC) 5 MG tablet Take 1 tablet (5 mg total) by mouth daily. 90 tablet 3   betamethasone, augmented, (DIPROLENE) 0.05 % lotion Apply topically to the scalp once daily as needed for itching 60 mL 1   botulinum toxin Type A (BOTOX) 100 units SOLR injection Inject 50 -100 units every 3 months 1  each 4   carbamazepine (TEGRETOL) 200 MG tablet Take 1 tablet by mouth twice daily for 3 days, then 1 tablet every morning and 2 tablets every evening for 3 days, then 2 tablets twice a day thereafter. 120 tablet 1   Continuous Blood Gluc Receiver (DEXCOM G7 RECEIVER) DEVI use as directed 9 each 1   Continuous Blood Gluc Sensor (DEXCOM G7 SENSOR) MISC Use as directed 9 each 1   Deutetrabenazine (AUSTEDO) 9 MG TABS Take 9 mg by mouth 2 (two) times daily. 60 tablet 5   Ferric Maltol (ACCRUFER) 30 MG CAPS Take 1 capsule by mouth in the morning and at bedtime. 180 capsule 1   fluticasone (FLONASE) 50 MCG/ACT nasal spray Place 1 spray into both nostrils daily. 16 g 11   ketoconazole (NIZORAL) 2 % cream Apply 1 Application to scalp and wash once a week.  Leave in 5-10min 30 g 3   ketoconazole (NIZORAL) 2 % shampoo Apply 1 Application topically once a week to scalp, leave in 5-10 minutes and rinse. 120 mL 1   levocetirizine (XYZAL) 5 MG tablet Take 1 tablet (5 mg total) by mouth every evening. 90 tablet 0   LORazepam (ATIVAN) 1 MG tablet Take 1 tablet TWICE daily, and 1/2 tablet ONCE a day 75 tablet 1   metFORMIN (GLUCOPHAGE-XR) 750 MG 24 hr tablet Take 2 tablets (1,500 mg total) by mouth daily with breakfast. 180 tablet 0   Multiple Vitamin (MULTIVITAMIN) tablet Take by mouth daily. Chew 2 per day  nebivolol (BYSTOLIC) 5 MG tablet Take 1 tablet (5 mg total) by mouth daily. 90 tablet 0   Probiotic Product (PROBIOTIC PO) Take by mouth daily.     QUEtiapine (SEROQUEL) 100 MG tablet Take 1 tablet (100 mg total) by mouth 2 (two) times daily. 60 tablet 1   QUEtiapine (SEROQUEL) 200 MG tablet Take 1 tablet (200 mg total) by mouth at bedtime. 90 tablet 0   traZODone (DESYREL) 50 MG tablet take 1/2 - 2 tablet by mouth at night as needed for insomnia 180 tablet 1   trihexyphenidyl (ARTANE) 2 MG tablet Take 0.5 tablets (1 mg total) by mouth 2 (two) times daily with meals 90 tablet 3   Deutetrabenazine (AUSTEDO)  12 MG TABS Take 2 tablets by mouth 2 (two) times daily. 120 tablet 3   Deutetrabenazine (AUSTEDO) 12 MG TABS Take 2 tablets (24 mg) by mouth 2 (two) times daily. 120 tablet 3   Deutetrabenazine (AUSTEDO) 12 MG TABS Take 24 mg by mouth 2 (two) times daily. 120 tablet 3   Deutetrabenazine (AUSTEDO) 12 MG TABS Take 2 tablets (24mg ) by mouth 2 (two) times daily. 120 tablet 3   Dulaglutide (TRULICITY) 1.5 MG/0.5ML SOPN Inject 1.5 mg into the skin once a week. 6 mL 0   LORazepam (ATIVAN) 1 MG tablet Take 1 tablet (1 mg total) by mouth 3 (three) times daily. 90 tablet 3   LORazepam (ATIVAN) 1 MG tablet Take 1 tablet (1 mg total) by mouth 2 (two) times daily AND 0.5 tablets (0.5 mg total) daily. 75 tablet 3   LORazepam (ATIVAN) 1 MG tablet Take 1 tablet (1 mg total) by mouth 2 (two) times daily and 1/2 tablet once daily 75 tablet 3   zinc gluconate 50 MG tablet Take 1 tablet (50 mg total) by mouth daily. 90 tablet 1   benzonatate (TESSALON) 100 MG capsule Take 1 capsule (100 mg total) by mouth every 8 (eight) hours. 21 capsule 0   lithium carbonate 300 MG capsule Take 1 capsule (300 mg total) by mouth every evening for 4 days, THEN 2 capsules (600 mg total) every evening for 4 days, THEN 3 capsules (900 mg total) every evening. 90 capsule 3   lithium carbonate 300 MG capsule Take 3 capsules (900 mg total) by mouth every evening. 90 capsule 3   No facility-administered medications prior to visit.    ROS Review of Systems  Constitutional: Negative.  Negative for chills, fatigue and fever.  HENT:  Positive for trouble swallowing. Negative for voice change.   Respiratory: Negative.  Negative for cough, chest tightness, shortness of breath and wheezing.   Cardiovascular:  Negative for chest pain, palpitations and leg swelling.  Gastrointestinal:  Negative for abdominal pain, constipation, diarrhea, nausea and vomiting.  Endocrine: Negative.   Genitourinary: Negative.   Musculoskeletal: Negative.   Negative for arthralgias and myalgias.  Skin: Negative.   Neurological: Negative.  Negative for dizziness, weakness, light-headedness and headaches.  Hematological:  Negative for adenopathy. Does not bruise/bleed easily.  Psychiatric/Behavioral: Negative.      Objective:  BP 136/88 (BP Location: Right Arm, Patient Position: Sitting, Cuff Size: Large)   Pulse 96   Temp 98.2 F (36.8 C) (Oral)   Ht 5\' 2"  (1.575 m)   Wt 234 lb (106.1 kg)   SpO2 96%   BMI 42.80 kg/m   BP Readings from Last 3 Encounters:  02/25/23 136/88  11/21/22 134/82  09/26/22 (!) 138/109    Wt Readings from Last 3 Encounters:  02/25/23 234 lb (106.1 kg)  11/21/22 239 lb (108.4 kg)  09/25/22 233 lb (105.7 kg)    Physical Exam Vitals reviewed.  Constitutional:      General: She is not in acute distress.    Appearance: She is ill-appearing. She is not toxic-appearing or diaphoretic.  HENT:     Mouth/Throat:     Mouth: Mucous membranes are moist.  Eyes:     General: No scleral icterus.    Conjunctiva/sclera: Conjunctivae normal.  Cardiovascular:     Rate and Rhythm: Normal rate and regular rhythm.     Heart sounds: No murmur heard.    No gallop.  Pulmonary:     Effort: Pulmonary effort is normal.     Breath sounds: No stridor. No wheezing, rhonchi or rales.  Abdominal:     General: Abdomen is flat.     Palpations: There is no mass.     Tenderness: There is no abdominal tenderness. There is no guarding.     Hernia: No hernia is present.  Musculoskeletal:        General: Normal range of motion.     Cervical back: Neck supple.     Right lower leg: No edema.     Left lower leg: No edema.  Lymphadenopathy:     Cervical: No cervical adenopathy.  Skin:    General: Skin is warm and dry.  Neurological:     General: No focal deficit present.     Mental Status: She is alert. Mental status is at baseline.  Psychiatric:        Mood and Affect: Mood normal.        Behavior: Behavior normal.      Lab Results  Component Value Date   WBC 5.7 02/25/2023   HGB 11.9 (L) 02/25/2023   HCT 37.5 02/25/2023   PLT 262.0 02/25/2023   GLUCOSE 201 (H) 02/25/2023   CHOL 249 (H) 02/25/2023   TRIG (H) 02/25/2023    406.0 Triglyceride is over 400; calculations on Lipids are invalid.   HDL 29.70 (L) 02/25/2023   LDLDIRECT 77.0 02/25/2023   LDLCALC 121 (H) 04/17/2020   ALT 17 02/25/2023   AST 18 02/25/2023   NA 136 02/25/2023   K 4.2 02/25/2023   CL 99 02/25/2023   CREATININE 0.54 02/25/2023   BUN 7 02/25/2023   CO2 29 02/25/2023   TSH 1.13 02/25/2023   HGBA1C 7.6 (H) 02/25/2023   MICROALBUR <0.7 02/25/2023    [place] FNA BX THYROID 1ST LESION AFIRMA  Result Date: 11/07/2022 INDICATION: Indeterminate thyroid nodule EXAM: ULTRASOUND GUIDED FINE NEEDLE ASPIRATION OF INDETERMINATE THYROID NODULE COMPARISON:  None Available. MEDICATIONS: None COMPLICATIONS: None immediate. TECHNIQUE: Informed written consent was obtained from the patient after a discussion of the risks, benefits and alternatives to treatment. Questions regarding the procedure were encouraged and answered. A timeout was performed prior to the initiation of the procedure. Pre-procedural ultrasound scanning demonstrated unchanged size and appearance of the indeterminate nodule within the thyroid isthmus The procedure was planned. The neck was prepped in the usual sterile fashion, and a sterile drape was applied covering the operative field. A timeout was performed prior to the initiation of the procedure. Local anesthesia was provided with 1% lidocaine. Under direct ultrasound guidance, 5 FNA biopsies were performed of the nodule with a 25 gauge needle. Two samples were reserved for future Afirma testing. Multiple ultrasound images were saved for procedural documentation purposes. The samples were prepared and submitted to pathology. Limited post procedural  scanning was negative for hematoma or additional complication. Dressings were  placed. The patient tolerated the above procedures procedure well without immediate postprocedural complication. FINDINGS: Nodule reference number based on prior diagnostic ultrasound: 1 Maximum size: 2.5 cm Location: Isthmus; Inferior ACR TI-RADS risk category: TR3 (3 points) Reason for biopsy: meets ACR TI-RADS criteria Ultrasound imaging confirms appropriate placement of the needles within the thyroid nodule. IMPRESSION: Technically successful ultrasound guided fine needle aspiration of the isthmic thyroid nodule. Electronically Signed   By: Malachy Moan M.D.   On: 11/07/2022 16:41    Assessment & Plan:  Anemia due to zinc deficiency -     CBC with Differential/Platelet; Future -     Zinc; Future -     Zinc Gluconate; Take 1 tablet (50 mg total) by mouth daily.  Dispense: 90 tablet; Refill: 1  B12 deficiency -     CBC with Differential/Platelet; Future -     Vitamin B12; Future  Benign essential hypertension -     TSH; Future -     Urinalysis, Routine w reflex microscopic; Future -     Basic metabolic panel; Future  Class 3 obesity (HCC) -     TSH; Future  Familial multiple lipoprotein-type hyperlipidemia -     Lipid panel; Future -     Lipoprotein A (LPA); Future  Hyperlipidemia associated with type 2 diabetes mellitus (HCC) -     Lipid panel; Future -     Lipoprotein A (LPA); Future -     TSH; Future -     Hepatic function panel; Future  Iron deficiency anemia due to chronic blood loss -     IBC + Ferritin; Future -     CBC with Differential/Platelet; Future  Sickle cell trait (HCC) -     CBC with Differential/Platelet; Future  Type II diabetes mellitus with manifestations (HCC) -     Hemoglobin A1c; Future -     Microalbumin / creatinine urine ratio; Future -     Basic metabolic panel; Future -     HM Diabetes Foot Exam -     Ambulatory referral to Ophthalmology  Cervical cancer screening -     Ambulatory referral to Gynecology  Involuntary muscle  contractions -     Myoglobin, serum; Future -     CK; Future  Hematuria, unspecified type -     Myoglobin, serum; Future -     CK; Future  Other orders -     LDL cholesterol, direct     Follow-up: Return in about 4 months (around 06/27/2023).  Sanda Linger, MD

## 2023-02-26 ENCOUNTER — Other Ambulatory Visit (HOSPITAL_COMMUNITY): Payer: Self-pay

## 2023-02-26 ENCOUNTER — Encounter: Payer: Self-pay | Admitting: Internal Medicine

## 2023-02-26 ENCOUNTER — Other Ambulatory Visit (INDEPENDENT_AMBULATORY_CARE_PROVIDER_SITE_OTHER): Payer: 59

## 2023-02-26 DIAGNOSIS — M6240 Contracture of muscle, unspecified site: Secondary | ICD-10-CM | POA: Diagnosis not present

## 2023-02-26 DIAGNOSIS — R319 Hematuria, unspecified: Secondary | ICD-10-CM | POA: Diagnosis not present

## 2023-02-26 DIAGNOSIS — Z124 Encounter for screening for malignant neoplasm of cervix: Secondary | ICD-10-CM | POA: Insufficient documentation

## 2023-02-26 LAB — URINALYSIS, ROUTINE W REFLEX MICROSCOPIC
Bilirubin Urine: NEGATIVE
Ketones, ur: NEGATIVE
Leukocytes,Ua: NEGATIVE
Nitrite: NEGATIVE
Specific Gravity, Urine: 1.01 (ref 1.000–1.030)
Total Protein, Urine: NEGATIVE
Urine Glucose: NEGATIVE
Urobilinogen, UA: 0.2 (ref 0.0–1.0)
pH: 6 (ref 5.0–8.0)

## 2023-02-26 LAB — CK: Total CK: 79 U/L (ref 7–177)

## 2023-02-27 DIAGNOSIS — I10 Essential (primary) hypertension: Secondary | ICD-10-CM | POA: Diagnosis not present

## 2023-02-27 DIAGNOSIS — F250 Schizoaffective disorder, bipolar type: Secondary | ICD-10-CM | POA: Diagnosis not present

## 2023-02-27 DIAGNOSIS — D573 Sickle-cell trait: Secondary | ICD-10-CM | POA: Diagnosis not present

## 2023-02-27 DIAGNOSIS — J392 Other diseases of pharynx: Secondary | ICD-10-CM | POA: Diagnosis not present

## 2023-02-27 DIAGNOSIS — Z7984 Long term (current) use of oral hypoglycemic drugs: Secondary | ICD-10-CM | POA: Diagnosis not present

## 2023-02-27 DIAGNOSIS — M62838 Other muscle spasm: Secondary | ICD-10-CM | POA: Diagnosis not present

## 2023-02-27 DIAGNOSIS — G244 Idiopathic orofacial dystonia: Secondary | ICD-10-CM | POA: Diagnosis not present

## 2023-02-27 DIAGNOSIS — E785 Hyperlipidemia, unspecified: Secondary | ICD-10-CM | POA: Diagnosis not present

## 2023-02-27 DIAGNOSIS — E7849 Other hyperlipidemia: Secondary | ICD-10-CM | POA: Diagnosis not present

## 2023-02-27 DIAGNOSIS — F319 Bipolar disorder, unspecified: Secondary | ICD-10-CM | POA: Diagnosis not present

## 2023-02-27 DIAGNOSIS — G245 Blepharospasm: Secondary | ICD-10-CM | POA: Diagnosis not present

## 2023-02-27 DIAGNOSIS — R4789 Other speech disturbances: Secondary | ICD-10-CM | POA: Diagnosis not present

## 2023-02-27 DIAGNOSIS — E119 Type 2 diabetes mellitus without complications: Secondary | ICD-10-CM | POA: Diagnosis not present

## 2023-02-27 DIAGNOSIS — D649 Anemia, unspecified: Secondary | ICD-10-CM | POA: Diagnosis not present

## 2023-03-01 LAB — MYOGLOBIN, SERUM: Myoglobin: <30 mcg/L (ref <=66)

## 2023-03-01 LAB — ZINC: Zinc: 66 ug/dL (ref 60–130)

## 2023-03-01 LAB — LIPOPROTEIN A (LPA): Lipoprotein (a): 52 nmol/L (ref <75)

## 2023-03-04 DIAGNOSIS — G245 Blepharospasm: Secondary | ICD-10-CM | POA: Diagnosis not present

## 2023-03-04 DIAGNOSIS — J392 Other diseases of pharynx: Secondary | ICD-10-CM | POA: Diagnosis not present

## 2023-03-04 DIAGNOSIS — E785 Hyperlipidemia, unspecified: Secondary | ICD-10-CM | POA: Diagnosis not present

## 2023-03-04 DIAGNOSIS — G244 Idiopathic orofacial dystonia: Secondary | ICD-10-CM | POA: Diagnosis not present

## 2023-03-04 DIAGNOSIS — F250 Schizoaffective disorder, bipolar type: Secondary | ICD-10-CM | POA: Diagnosis not present

## 2023-03-04 DIAGNOSIS — I10 Essential (primary) hypertension: Secondary | ICD-10-CM | POA: Diagnosis not present

## 2023-03-04 DIAGNOSIS — F319 Bipolar disorder, unspecified: Secondary | ICD-10-CM | POA: Diagnosis not present

## 2023-03-04 DIAGNOSIS — E119 Type 2 diabetes mellitus without complications: Secondary | ICD-10-CM | POA: Diagnosis not present

## 2023-03-04 DIAGNOSIS — M62838 Other muscle spasm: Secondary | ICD-10-CM | POA: Diagnosis not present

## 2023-03-04 DIAGNOSIS — D573 Sickle-cell trait: Secondary | ICD-10-CM | POA: Diagnosis not present

## 2023-03-06 DIAGNOSIS — E119 Type 2 diabetes mellitus without complications: Secondary | ICD-10-CM | POA: Diagnosis not present

## 2023-03-06 DIAGNOSIS — F250 Schizoaffective disorder, bipolar type: Secondary | ICD-10-CM | POA: Diagnosis not present

## 2023-03-06 DIAGNOSIS — G245 Blepharospasm: Secondary | ICD-10-CM | POA: Diagnosis not present

## 2023-03-06 DIAGNOSIS — D573 Sickle-cell trait: Secondary | ICD-10-CM | POA: Diagnosis not present

## 2023-03-06 DIAGNOSIS — J392 Other diseases of pharynx: Secondary | ICD-10-CM | POA: Diagnosis not present

## 2023-03-06 DIAGNOSIS — M62838 Other muscle spasm: Secondary | ICD-10-CM | POA: Diagnosis not present

## 2023-03-06 DIAGNOSIS — I10 Essential (primary) hypertension: Secondary | ICD-10-CM | POA: Diagnosis not present

## 2023-03-06 DIAGNOSIS — E785 Hyperlipidemia, unspecified: Secondary | ICD-10-CM | POA: Diagnosis not present

## 2023-03-06 DIAGNOSIS — G244 Idiopathic orofacial dystonia: Secondary | ICD-10-CM | POA: Diagnosis not present

## 2023-03-06 DIAGNOSIS — F319 Bipolar disorder, unspecified: Secondary | ICD-10-CM | POA: Diagnosis not present

## 2023-03-07 ENCOUNTER — Other Ambulatory Visit (HOSPITAL_COMMUNITY): Payer: Self-pay

## 2023-03-07 ENCOUNTER — Other Ambulatory Visit: Payer: Self-pay | Admitting: Internal Medicine

## 2023-03-07 DIAGNOSIS — D649 Anemia, unspecified: Secondary | ICD-10-CM | POA: Diagnosis not present

## 2023-03-07 DIAGNOSIS — F250 Schizoaffective disorder, bipolar type: Secondary | ICD-10-CM | POA: Diagnosis not present

## 2023-03-07 DIAGNOSIS — I10 Essential (primary) hypertension: Secondary | ICD-10-CM

## 2023-03-07 DIAGNOSIS — D573 Sickle-cell trait: Secondary | ICD-10-CM | POA: Diagnosis not present

## 2023-03-07 DIAGNOSIS — G245 Blepharospasm: Secondary | ICD-10-CM | POA: Diagnosis not present

## 2023-03-07 DIAGNOSIS — E785 Hyperlipidemia, unspecified: Secondary | ICD-10-CM | POA: Diagnosis not present

## 2023-03-07 DIAGNOSIS — E7849 Other hyperlipidemia: Secondary | ICD-10-CM | POA: Diagnosis not present

## 2023-03-07 DIAGNOSIS — J392 Other diseases of pharynx: Secondary | ICD-10-CM | POA: Diagnosis not present

## 2023-03-07 DIAGNOSIS — G244 Idiopathic orofacial dystonia: Secondary | ICD-10-CM | POA: Diagnosis not present

## 2023-03-07 DIAGNOSIS — F319 Bipolar disorder, unspecified: Secondary | ICD-10-CM | POA: Diagnosis not present

## 2023-03-07 DIAGNOSIS — E119 Type 2 diabetes mellitus without complications: Secondary | ICD-10-CM | POA: Diagnosis not present

## 2023-03-07 DIAGNOSIS — Z7984 Long term (current) use of oral hypoglycemic drugs: Secondary | ICD-10-CM

## 2023-03-07 DIAGNOSIS — M62838 Other muscle spasm: Secondary | ICD-10-CM | POA: Diagnosis not present

## 2023-03-07 MED ORDER — NEBIVOLOL HCL 5 MG PO TABS
5.0000 mg | ORAL_TABLET | Freq: Every day | ORAL | 0 refills | Status: DC
Start: 2023-03-07 — End: 2023-07-29
  Filled 2023-03-07: qty 30, 30d supply, fill #0
  Filled 2023-04-14: qty 30, 30d supply, fill #1

## 2023-03-10 ENCOUNTER — Other Ambulatory Visit (HOSPITAL_COMMUNITY): Payer: Self-pay

## 2023-03-10 MED ORDER — LORAZEPAM 1 MG PO TABS
1.0000 mg | ORAL_TABLET | ORAL | 0 refills | Status: DC
  Filled 2023-03-10: qty 75, 30d supply, fill #0

## 2023-03-10 MED ORDER — OXCARBAZEPINE 300 MG PO TABS
300.0000 mg | ORAL_TABLET | Freq: Two times a day (BID) | ORAL | 0 refills | Status: DC
  Filled 2023-03-10: qty 60, 30d supply, fill #0

## 2023-03-11 ENCOUNTER — Ambulatory Visit: Payer: 59 | Admitting: Internal Medicine

## 2023-03-13 DIAGNOSIS — G245 Blepharospasm: Secondary | ICD-10-CM | POA: Diagnosis not present

## 2023-03-14 ENCOUNTER — Other Ambulatory Visit: Payer: Self-pay

## 2023-03-14 ENCOUNTER — Other Ambulatory Visit (HOSPITAL_COMMUNITY): Payer: Self-pay

## 2023-03-14 DIAGNOSIS — J392 Other diseases of pharynx: Secondary | ICD-10-CM | POA: Diagnosis not present

## 2023-03-14 DIAGNOSIS — G245 Blepharospasm: Secondary | ICD-10-CM | POA: Diagnosis not present

## 2023-03-14 DIAGNOSIS — D573 Sickle-cell trait: Secondary | ICD-10-CM | POA: Diagnosis not present

## 2023-03-14 DIAGNOSIS — F250 Schizoaffective disorder, bipolar type: Secondary | ICD-10-CM | POA: Diagnosis not present

## 2023-03-14 DIAGNOSIS — E785 Hyperlipidemia, unspecified: Secondary | ICD-10-CM | POA: Diagnosis not present

## 2023-03-14 DIAGNOSIS — E119 Type 2 diabetes mellitus without complications: Secondary | ICD-10-CM | POA: Diagnosis not present

## 2023-03-14 DIAGNOSIS — I10 Essential (primary) hypertension: Secondary | ICD-10-CM | POA: Diagnosis not present

## 2023-03-14 DIAGNOSIS — M62838 Other muscle spasm: Secondary | ICD-10-CM | POA: Diagnosis not present

## 2023-03-14 DIAGNOSIS — F319 Bipolar disorder, unspecified: Secondary | ICD-10-CM | POA: Diagnosis not present

## 2023-03-14 DIAGNOSIS — G244 Idiopathic orofacial dystonia: Secondary | ICD-10-CM | POA: Diagnosis not present

## 2023-03-15 DIAGNOSIS — G4733 Obstructive sleep apnea (adult) (pediatric): Secondary | ICD-10-CM | POA: Diagnosis not present

## 2023-03-18 ENCOUNTER — Encounter: Payer: Self-pay | Admitting: Internal Medicine

## 2023-03-18 DIAGNOSIS — I10 Essential (primary) hypertension: Secondary | ICD-10-CM | POA: Diagnosis not present

## 2023-03-18 DIAGNOSIS — G244 Idiopathic orofacial dystonia: Secondary | ICD-10-CM | POA: Diagnosis not present

## 2023-03-18 DIAGNOSIS — G245 Blepharospasm: Secondary | ICD-10-CM | POA: Diagnosis not present

## 2023-03-18 DIAGNOSIS — E119 Type 2 diabetes mellitus without complications: Secondary | ICD-10-CM | POA: Diagnosis not present

## 2023-03-18 DIAGNOSIS — J392 Other diseases of pharynx: Secondary | ICD-10-CM | POA: Diagnosis not present

## 2023-03-18 DIAGNOSIS — F319 Bipolar disorder, unspecified: Secondary | ICD-10-CM | POA: Diagnosis not present

## 2023-03-18 DIAGNOSIS — F250 Schizoaffective disorder, bipolar type: Secondary | ICD-10-CM | POA: Diagnosis not present

## 2023-03-18 DIAGNOSIS — M62838 Other muscle spasm: Secondary | ICD-10-CM | POA: Diagnosis not present

## 2023-03-18 DIAGNOSIS — E785 Hyperlipidemia, unspecified: Secondary | ICD-10-CM | POA: Diagnosis not present

## 2023-03-18 DIAGNOSIS — D573 Sickle-cell trait: Secondary | ICD-10-CM | POA: Diagnosis not present

## 2023-03-19 ENCOUNTER — Other Ambulatory Visit (HOSPITAL_COMMUNITY): Payer: Self-pay

## 2023-03-19 ENCOUNTER — Other Ambulatory Visit: Payer: Self-pay | Admitting: Internal Medicine

## 2023-03-19 DIAGNOSIS — E785 Hyperlipidemia, unspecified: Secondary | ICD-10-CM | POA: Diagnosis not present

## 2023-03-19 DIAGNOSIS — G245 Blepharospasm: Secondary | ICD-10-CM | POA: Diagnosis not present

## 2023-03-19 DIAGNOSIS — M62838 Other muscle spasm: Secondary | ICD-10-CM | POA: Diagnosis not present

## 2023-03-19 DIAGNOSIS — I10 Essential (primary) hypertension: Secondary | ICD-10-CM | POA: Diagnosis not present

## 2023-03-19 DIAGNOSIS — G244 Idiopathic orofacial dystonia: Secondary | ICD-10-CM | POA: Diagnosis not present

## 2023-03-19 DIAGNOSIS — F250 Schizoaffective disorder, bipolar type: Secondary | ICD-10-CM | POA: Diagnosis not present

## 2023-03-19 DIAGNOSIS — Z1231 Encounter for screening mammogram for malignant neoplasm of breast: Secondary | ICD-10-CM

## 2023-03-19 DIAGNOSIS — E119 Type 2 diabetes mellitus without complications: Secondary | ICD-10-CM | POA: Diagnosis not present

## 2023-03-19 DIAGNOSIS — F319 Bipolar disorder, unspecified: Secondary | ICD-10-CM | POA: Diagnosis not present

## 2023-03-19 DIAGNOSIS — J392 Other diseases of pharynx: Secondary | ICD-10-CM | POA: Diagnosis not present

## 2023-03-19 DIAGNOSIS — D573 Sickle-cell trait: Secondary | ICD-10-CM | POA: Diagnosis not present

## 2023-03-25 DIAGNOSIS — F319 Bipolar disorder, unspecified: Secondary | ICD-10-CM | POA: Diagnosis not present

## 2023-03-25 DIAGNOSIS — J392 Other diseases of pharynx: Secondary | ICD-10-CM | POA: Diagnosis not present

## 2023-03-25 DIAGNOSIS — G245 Blepharospasm: Secondary | ICD-10-CM | POA: Diagnosis not present

## 2023-03-25 DIAGNOSIS — F250 Schizoaffective disorder, bipolar type: Secondary | ICD-10-CM | POA: Diagnosis not present

## 2023-03-25 DIAGNOSIS — D573 Sickle-cell trait: Secondary | ICD-10-CM | POA: Diagnosis not present

## 2023-03-25 DIAGNOSIS — G244 Idiopathic orofacial dystonia: Secondary | ICD-10-CM | POA: Diagnosis not present

## 2023-03-25 DIAGNOSIS — E785 Hyperlipidemia, unspecified: Secondary | ICD-10-CM | POA: Diagnosis not present

## 2023-03-25 DIAGNOSIS — E119 Type 2 diabetes mellitus without complications: Secondary | ICD-10-CM | POA: Diagnosis not present

## 2023-03-25 DIAGNOSIS — M62838 Other muscle spasm: Secondary | ICD-10-CM | POA: Diagnosis not present

## 2023-03-25 DIAGNOSIS — I10 Essential (primary) hypertension: Secondary | ICD-10-CM | POA: Diagnosis not present

## 2023-03-26 DIAGNOSIS — J392 Other diseases of pharynx: Secondary | ICD-10-CM | POA: Diagnosis not present

## 2023-03-26 DIAGNOSIS — E785 Hyperlipidemia, unspecified: Secondary | ICD-10-CM | POA: Diagnosis not present

## 2023-03-26 DIAGNOSIS — G244 Idiopathic orofacial dystonia: Secondary | ICD-10-CM | POA: Diagnosis not present

## 2023-03-26 DIAGNOSIS — E119 Type 2 diabetes mellitus without complications: Secondary | ICD-10-CM | POA: Diagnosis not present

## 2023-03-26 DIAGNOSIS — G245 Blepharospasm: Secondary | ICD-10-CM | POA: Diagnosis not present

## 2023-03-26 DIAGNOSIS — F319 Bipolar disorder, unspecified: Secondary | ICD-10-CM | POA: Diagnosis not present

## 2023-03-26 DIAGNOSIS — I10 Essential (primary) hypertension: Secondary | ICD-10-CM | POA: Diagnosis not present

## 2023-03-26 DIAGNOSIS — F250 Schizoaffective disorder, bipolar type: Secondary | ICD-10-CM | POA: Diagnosis not present

## 2023-03-26 DIAGNOSIS — D573 Sickle-cell trait: Secondary | ICD-10-CM | POA: Diagnosis not present

## 2023-03-26 DIAGNOSIS — M62838 Other muscle spasm: Secondary | ICD-10-CM | POA: Diagnosis not present

## 2023-03-31 DIAGNOSIS — H43392 Other vitreous opacities, left eye: Secondary | ICD-10-CM | POA: Diagnosis not present

## 2023-03-31 DIAGNOSIS — H53142 Visual discomfort, left eye: Secondary | ICD-10-CM | POA: Diagnosis not present

## 2023-03-31 DIAGNOSIS — E119 Type 2 diabetes mellitus without complications: Secondary | ICD-10-CM | POA: Diagnosis not present

## 2023-03-31 DIAGNOSIS — H25813 Combined forms of age-related cataract, bilateral: Secondary | ICD-10-CM | POA: Diagnosis not present

## 2023-03-31 DIAGNOSIS — H35033 Hypertensive retinopathy, bilateral: Secondary | ICD-10-CM | POA: Diagnosis not present

## 2023-04-01 ENCOUNTER — Other Ambulatory Visit (HOSPITAL_COMMUNITY): Payer: Self-pay

## 2023-04-01 ENCOUNTER — Other Ambulatory Visit: Payer: Self-pay

## 2023-04-01 DIAGNOSIS — G245 Blepharospasm: Secondary | ICD-10-CM | POA: Diagnosis not present

## 2023-04-01 DIAGNOSIS — D649 Anemia, unspecified: Secondary | ICD-10-CM | POA: Diagnosis not present

## 2023-04-01 DIAGNOSIS — E785 Hyperlipidemia, unspecified: Secondary | ICD-10-CM | POA: Diagnosis not present

## 2023-04-01 DIAGNOSIS — M62838 Other muscle spasm: Secondary | ICD-10-CM | POA: Diagnosis not present

## 2023-04-01 DIAGNOSIS — G244 Idiopathic orofacial dystonia: Secondary | ICD-10-CM | POA: Diagnosis not present

## 2023-04-01 DIAGNOSIS — I10 Essential (primary) hypertension: Secondary | ICD-10-CM | POA: Diagnosis not present

## 2023-04-01 DIAGNOSIS — Z7984 Long term (current) use of oral hypoglycemic drugs: Secondary | ICD-10-CM | POA: Diagnosis not present

## 2023-04-01 DIAGNOSIS — E119 Type 2 diabetes mellitus without complications: Secondary | ICD-10-CM | POA: Diagnosis not present

## 2023-04-01 DIAGNOSIS — F250 Schizoaffective disorder, bipolar type: Secondary | ICD-10-CM | POA: Diagnosis not present

## 2023-04-01 DIAGNOSIS — J392 Other diseases of pharynx: Secondary | ICD-10-CM | POA: Diagnosis not present

## 2023-04-01 DIAGNOSIS — E7849 Other hyperlipidemia: Secondary | ICD-10-CM | POA: Diagnosis not present

## 2023-04-01 DIAGNOSIS — D573 Sickle-cell trait: Secondary | ICD-10-CM | POA: Diagnosis not present

## 2023-04-01 DIAGNOSIS — F319 Bipolar disorder, unspecified: Secondary | ICD-10-CM | POA: Diagnosis not present

## 2023-04-01 MED ORDER — QUETIAPINE FUMARATE 100 MG PO TABS
100.0000 mg | ORAL_TABLET | Freq: Two times a day (BID) | ORAL | 1 refills | Status: DC
  Filled 2023-04-01: qty 60, 30d supply, fill #0

## 2023-04-06 DIAGNOSIS — F250 Schizoaffective disorder, bipolar type: Secondary | ICD-10-CM | POA: Diagnosis not present

## 2023-04-06 DIAGNOSIS — D649 Anemia, unspecified: Secondary | ICD-10-CM | POA: Diagnosis not present

## 2023-04-06 DIAGNOSIS — J392 Other diseases of pharynx: Secondary | ICD-10-CM | POA: Diagnosis not present

## 2023-04-06 DIAGNOSIS — I10 Essential (primary) hypertension: Secondary | ICD-10-CM | POA: Diagnosis not present

## 2023-04-06 DIAGNOSIS — E785 Hyperlipidemia, unspecified: Secondary | ICD-10-CM | POA: Diagnosis not present

## 2023-04-06 DIAGNOSIS — E7849 Other hyperlipidemia: Secondary | ICD-10-CM | POA: Diagnosis not present

## 2023-04-06 DIAGNOSIS — Z7984 Long term (current) use of oral hypoglycemic drugs: Secondary | ICD-10-CM | POA: Diagnosis not present

## 2023-04-06 DIAGNOSIS — M62838 Other muscle spasm: Secondary | ICD-10-CM | POA: Diagnosis not present

## 2023-04-06 DIAGNOSIS — G245 Blepharospasm: Secondary | ICD-10-CM | POA: Diagnosis not present

## 2023-04-06 DIAGNOSIS — G244 Idiopathic orofacial dystonia: Secondary | ICD-10-CM | POA: Diagnosis not present

## 2023-04-06 DIAGNOSIS — D573 Sickle-cell trait: Secondary | ICD-10-CM | POA: Diagnosis not present

## 2023-04-06 DIAGNOSIS — E119 Type 2 diabetes mellitus without complications: Secondary | ICD-10-CM | POA: Diagnosis not present

## 2023-04-06 DIAGNOSIS — F319 Bipolar disorder, unspecified: Secondary | ICD-10-CM | POA: Diagnosis not present

## 2023-04-07 ENCOUNTER — Other Ambulatory Visit (HOSPITAL_COMMUNITY): Payer: Self-pay

## 2023-04-07 DIAGNOSIS — M62838 Other muscle spasm: Secondary | ICD-10-CM | POA: Diagnosis not present

## 2023-04-07 DIAGNOSIS — F250 Schizoaffective disorder, bipolar type: Secondary | ICD-10-CM | POA: Diagnosis not present

## 2023-04-07 DIAGNOSIS — Z7984 Long term (current) use of oral hypoglycemic drugs: Secondary | ICD-10-CM | POA: Diagnosis not present

## 2023-04-07 DIAGNOSIS — G244 Idiopathic orofacial dystonia: Secondary | ICD-10-CM | POA: Diagnosis not present

## 2023-04-07 DIAGNOSIS — D573 Sickle-cell trait: Secondary | ICD-10-CM | POA: Diagnosis not present

## 2023-04-07 DIAGNOSIS — E785 Hyperlipidemia, unspecified: Secondary | ICD-10-CM | POA: Diagnosis not present

## 2023-04-07 DIAGNOSIS — D649 Anemia, unspecified: Secondary | ICD-10-CM | POA: Diagnosis not present

## 2023-04-07 DIAGNOSIS — J392 Other diseases of pharynx: Secondary | ICD-10-CM | POA: Diagnosis not present

## 2023-04-07 DIAGNOSIS — G245 Blepharospasm: Secondary | ICD-10-CM | POA: Diagnosis not present

## 2023-04-07 DIAGNOSIS — F319 Bipolar disorder, unspecified: Secondary | ICD-10-CM | POA: Diagnosis not present

## 2023-04-07 DIAGNOSIS — E119 Type 2 diabetes mellitus without complications: Secondary | ICD-10-CM | POA: Diagnosis not present

## 2023-04-07 DIAGNOSIS — E7849 Other hyperlipidemia: Secondary | ICD-10-CM | POA: Diagnosis not present

## 2023-04-07 DIAGNOSIS — I10 Essential (primary) hypertension: Secondary | ICD-10-CM | POA: Diagnosis not present

## 2023-04-08 ENCOUNTER — Other Ambulatory Visit (HOSPITAL_COMMUNITY): Payer: Self-pay

## 2023-04-08 MED ORDER — LORAZEPAM 1 MG PO TABS
ORAL_TABLET | ORAL | 0 refills | Status: DC
  Filled 2023-04-08: qty 75, 30d supply, fill #0

## 2023-04-09 ENCOUNTER — Other Ambulatory Visit (HOSPITAL_COMMUNITY): Payer: Self-pay

## 2023-04-10 ENCOUNTER — Encounter: Payer: 59 | Admitting: Family Medicine

## 2023-04-10 DIAGNOSIS — I10 Essential (primary) hypertension: Secondary | ICD-10-CM | POA: Diagnosis not present

## 2023-04-10 DIAGNOSIS — G244 Idiopathic orofacial dystonia: Secondary | ICD-10-CM | POA: Diagnosis not present

## 2023-04-11 ENCOUNTER — Other Ambulatory Visit: Payer: Self-pay

## 2023-04-11 ENCOUNTER — Other Ambulatory Visit (HOSPITAL_COMMUNITY): Payer: Self-pay

## 2023-04-11 DIAGNOSIS — F3131 Bipolar disorder, current episode depressed, mild: Secondary | ICD-10-CM | POA: Diagnosis not present

## 2023-04-11 DIAGNOSIS — G2401 Drug induced subacute dyskinesia: Secondary | ICD-10-CM | POA: Diagnosis not present

## 2023-04-11 MED ORDER — LORAZEPAM 1 MG PO TABS
ORAL_TABLET | ORAL | 3 refills | Status: AC
  Filled 2023-04-11: qty 75, 30d supply, fill #0

## 2023-04-11 MED ORDER — OXCARBAZEPINE 300 MG PO TABS
ORAL_TABLET | ORAL | 3 refills | Status: AC
  Filled 2023-04-11: qty 90, 30d supply, fill #0

## 2023-04-11 MED ORDER — QUETIAPINE FUMARATE 100 MG PO TABS
100.0000 mg | ORAL_TABLET | Freq: Every day | ORAL | 3 refills | Status: AC
  Filled 2023-04-11: qty 30, 30d supply, fill #0

## 2023-04-14 ENCOUNTER — Other Ambulatory Visit (HOSPITAL_COMMUNITY): Payer: Self-pay

## 2023-04-14 DIAGNOSIS — E119 Type 2 diabetes mellitus without complications: Secondary | ICD-10-CM | POA: Diagnosis not present

## 2023-04-14 DIAGNOSIS — E785 Hyperlipidemia, unspecified: Secondary | ICD-10-CM | POA: Diagnosis not present

## 2023-04-14 DIAGNOSIS — G244 Idiopathic orofacial dystonia: Secondary | ICD-10-CM | POA: Diagnosis not present

## 2023-04-14 DIAGNOSIS — F250 Schizoaffective disorder, bipolar type: Secondary | ICD-10-CM | POA: Diagnosis not present

## 2023-04-14 DIAGNOSIS — D649 Anemia, unspecified: Secondary | ICD-10-CM | POA: Diagnosis not present

## 2023-04-14 DIAGNOSIS — G245 Blepharospasm: Secondary | ICD-10-CM | POA: Diagnosis not present

## 2023-04-14 DIAGNOSIS — M62838 Other muscle spasm: Secondary | ICD-10-CM | POA: Diagnosis not present

## 2023-04-14 DIAGNOSIS — D573 Sickle-cell trait: Secondary | ICD-10-CM | POA: Diagnosis not present

## 2023-04-14 DIAGNOSIS — E7849 Other hyperlipidemia: Secondary | ICD-10-CM | POA: Diagnosis not present

## 2023-04-14 DIAGNOSIS — J392 Other diseases of pharynx: Secondary | ICD-10-CM | POA: Diagnosis not present

## 2023-04-14 DIAGNOSIS — Z7984 Long term (current) use of oral hypoglycemic drugs: Secondary | ICD-10-CM | POA: Diagnosis not present

## 2023-04-14 DIAGNOSIS — I10 Essential (primary) hypertension: Secondary | ICD-10-CM | POA: Diagnosis not present

## 2023-04-14 DIAGNOSIS — F319 Bipolar disorder, unspecified: Secondary | ICD-10-CM | POA: Diagnosis not present

## 2023-04-15 DIAGNOSIS — G4733 Obstructive sleep apnea (adult) (pediatric): Secondary | ICD-10-CM | POA: Diagnosis not present

## 2023-04-16 ENCOUNTER — Other Ambulatory Visit (HOSPITAL_COMMUNITY): Payer: Self-pay

## 2023-04-18 ENCOUNTER — Other Ambulatory Visit (HOSPITAL_COMMUNITY): Payer: Self-pay

## 2023-04-27 ENCOUNTER — Encounter: Payer: Self-pay | Admitting: Internal Medicine

## 2023-05-06 ENCOUNTER — Ambulatory Visit
Admission: RE | Admit: 2023-05-06 | Discharge: 2023-05-06 | Disposition: A | Discharge status: Home / Self Care | Payer: 59 | Source: Ambulatory Visit | Attending: Internal Medicine | Admitting: Internal Medicine

## 2023-05-06 DIAGNOSIS — Z1231 Encounter for screening mammogram for malignant neoplasm of breast: Secondary | ICD-10-CM

## 2023-05-07 ENCOUNTER — Ambulatory Visit (INDEPENDENT_AMBULATORY_CARE_PROVIDER_SITE_OTHER): Payer: 59 | Admitting: Internal Medicine

## 2023-05-07 ENCOUNTER — Other Ambulatory Visit: Payer: Self-pay | Admitting: Internal Medicine

## 2023-05-07 ENCOUNTER — Other Ambulatory Visit (HOSPITAL_COMMUNITY): Payer: Self-pay

## 2023-05-07 ENCOUNTER — Encounter: Payer: Self-pay | Admitting: Internal Medicine

## 2023-05-07 VITALS — BP 122/78 | HR 85 | Temp 98.3°F | Resp 16 | Ht 62.0 in | Wt 242.0 lb

## 2023-05-07 DIAGNOSIS — Z7984 Long term (current) use of oral hypoglycemic drugs: Secondary | ICD-10-CM

## 2023-05-07 DIAGNOSIS — Z7985 Long-term (current) use of injectable non-insulin antidiabetic drugs: Secondary | ICD-10-CM

## 2023-05-07 DIAGNOSIS — N63 Unspecified lump in unspecified breast: Secondary | ICD-10-CM

## 2023-05-07 DIAGNOSIS — E538 Deficiency of other specified B group vitamins: Secondary | ICD-10-CM

## 2023-05-07 DIAGNOSIS — E118 Type 2 diabetes mellitus with unspecified complications: Secondary | ICD-10-CM

## 2023-05-07 DIAGNOSIS — E781 Pure hyperglyceridemia: Secondary | ICD-10-CM

## 2023-05-07 LAB — CBC WITH DIFFERENTIAL/PLATELET
Basophils Absolute: 0 10*3/uL (ref 0.0–0.1)
Basophils Relative: 0.5 % (ref 0.0–3.0)
Eosinophils Absolute: 0.2 10*3/uL (ref 0.0–0.7)
Eosinophils Relative: 1.8 % (ref 0.0–5.0)
HCT: 37.6 % (ref 36.0–46.0)
Hemoglobin: 12.1 g/dL (ref 12.0–15.0)
Lymphocytes Relative: 29.5 % (ref 12.0–46.0)
Lymphs Abs: 2.8 10*3/uL (ref 0.7–4.0)
MCHC: 32.1 g/dL (ref 30.0–36.0)
MCV: 74 fl — ABNORMAL LOW (ref 78.0–100.0)
Monocytes Absolute: 0.7 10*3/uL (ref 0.1–1.0)
Monocytes Relative: 7.9 % (ref 3.0–12.0)
Neutro Abs: 5.7 10*3/uL (ref 1.4–7.7)
Neutrophils Relative %: 60.3 % (ref 43.0–77.0)
Platelets: 309 10*3/uL (ref 150.0–400.0)
RBC: 5.08 Mil/uL (ref 3.87–5.11)
RDW: 16 % — ABNORMAL HIGH (ref 11.5–15.5)
WBC: 9.4 10*3/uL (ref 4.0–10.5)

## 2023-05-07 LAB — BASIC METABOLIC PANEL
BUN: 10 mg/dL (ref 6–23)
CO2: 32 meq/L (ref 19–32)
Calcium: 9.6 mg/dL (ref 8.4–10.5)
Chloride: 100 meq/L (ref 96–112)
Creatinine, Ser: 0.58 mg/dL (ref 0.40–1.20)
GFR: 110.21 mL/min (ref >60.00)
Glucose, Bld: 209 mg/dL — ABNORMAL HIGH (ref 70–99)
Potassium: 3.7 meq/L (ref 3.5–5.1)
Sodium: 139 meq/L (ref 135–145)

## 2023-05-07 LAB — TRIGLYCERIDES: Triglycerides: 426 mg/dL — ABNORMAL HIGH (ref 0.0–149.0)

## 2023-05-07 LAB — POCT GLYCOSYLATED HEMOGLOBIN (HGB A1C): Hemoglobin A1C: 7.7 % — AB (ref 4.0–5.6)

## 2023-05-07 LAB — GLUCOSE, POCT (MANUAL RESULT ENTRY): POC Glucose: 233 mg/dL — AB (ref 70–99)

## 2023-05-07 MED ORDER — CYANOCOBALAMIN 1000 MCG/ML IJ SOLN
1000.0000 ug | Freq: Once | INTRAMUSCULAR | Status: AC
Start: 2023-05-07 — End: 2023-05-07
  Administered 2023-05-07: 1000 ug via INTRAMUSCULAR

## 2023-05-07 MED ORDER — TRULICITY 0.75 MG/0.5ML ~~LOC~~ SOAJ: 0.7500 mg | SUBCUTANEOUS | 0 refills | Status: DC

## 2023-05-07 NOTE — Progress Notes (Unsigned)
Subjective:  Patient ID: [name], female    DOB: Oct 11, 1980  Age: 42 y.o. MRN: [id number]  CC: Anemia and Diabetes   HPI Kaylee Hill presents for f/up ----  Discussed the use of AI scribe software for clinical note transcription with the patient, who gave verbal consent to proceed.  History of Present Illness   The patient, with a history of diabetes, has been experiencing elevated blood glucose levels over the past few weeks, with the highest recorded value being in the 300s. Despite adherence to metformin, the patient reports a significant increase in blood sugar levels post meals. This hyperglycemia has been accompanied by polyuria and increasing thirst, but no changes in vision. The patient had a diabetic eye exam last month, which was reported as normal.  Previously, the patient was treated with Trulicity but has discontinued its use.       Outpatient Medications Prior to Visit  Medication Sig Dispense Refill   amLODipine (NORVASC) 5 MG tablet Take 1 tablet (5 mg total) by mouth daily. 90 tablet 3   betamethasone, augmented, (DIPROLENE) 0.05 % lotion Apply topically to the scalp once daily as needed for itching 60 mL 1   botulinum toxin Type A (BOTOX) 100 units SOLR injection Inject 50 -100 units every 3 months 1 each 4   Continuous Blood Gluc Receiver (DEXCOM G7 RECEIVER) DEVI use as directed 9 each 1   Continuous Glucose Sensor (DEXCOM G7 SENSOR) MISC Use as directed 9 each 1   Deutetrabenazine (AUSTEDO) 9 MG TABS Take 9 mg by mouth 2 (two) times daily. 60 tablet 5   Ferric Maltol (ACCRUFER) 30 MG CAPS Take 1 capsule by mouth in the morning and at bedtime. 180 capsule 1   fluticasone (FLONASE) 50 MCG/ACT nasal spray Place 1 spray into both nostrils daily. 16 g 11   ketoconazole (NIZORAL) 2 % cream Apply 1 Application to scalp and wash once a week.  Leave in 5-10min 30 g 3   ketoconazole (NIZORAL) 2 % shampoo Apply 1 Application topically once a week to scalp, leave in  5-10 minutes and rinse. 120 mL 1   levocetirizine (XYZAL) 5 MG tablet Take 1 tablet (5 mg total) by mouth every evening. 90 tablet 0   LORazepam (ATIVAN) 1 MG tablet Take 1 tablet (1 mg total) by mouth 2 (two) times daily AND 0.5 tablets (0.5 mg total) daily. 75 tablet 3   metFORMIN (GLUCOPHAGE-XR) 750 MG 24 hr tablet Take 2 tablets (1,500 mg total) by mouth daily with breakfast. 180 tablet 0   Multiple Vitamin (MULTIVITAMIN) tablet Take by mouth daily. Chew 2 per day     nebivolol (BYSTOLIC) 5 MG tablet Take 1 tablet (5 mg total) by mouth daily. 90 tablet 0   Oxcarbazepine (TRILEPTAL) 300 MG tablet Take 1 tablet (300 mg total) by mouth in the morning AND 2 tablets (600 mg total) every evening. 90 tablet 3   Probiotic Product (PROBIOTIC PO) Take by mouth daily.     QUEtiapine (SEROQUEL) 100 MG tablet Take 1 tablet (100 mg total) by mouth at bedtime. 30 tablet 3   QUEtiapine (SEROQUEL) 200 MG tablet Take 1 tablet (200 mg total) by mouth at bedtime. 90 tablet 0   trihexyphenidyl (ARTANE) 2 MG tablet Take 0.5 tablets (1 mg total) by mouth 2 (two) times daily with meals 90 tablet 3   zinc gluconate 50 MG tablet Take 1 tablet (50 mg total) by mouth daily. 90 tablet 1  QUEtiapine (SEROQUEL) 100 MG tablet Take 1 tablet (100 mg total) by mouth 2 (two) times daily. 60 tablet 1   traZODone (DESYREL) 50 MG tablet take 1/2 - 2 tablet by mouth at night as needed for insomnia 180 tablet 1   No facility-administered medications prior to visit.    ROS Review of Systems  Constitutional: Negative.  Negative for diaphoresis and fatigue.  HENT: Negative.    Respiratory:  Negative for cough, chest tightness, shortness of breath and wheezing.   Cardiovascular:  Negative for chest pain, palpitations and leg swelling.  Gastrointestinal: Negative.  Negative for abdominal pain.  Endocrine: Positive for polydipsia, polyphagia and polyuria.  Musculoskeletal: Negative.   Neurological: Negative.   Hematological:  Negative.   Psychiatric/Behavioral: Negative.      Objective:  BP 122/78 (BP Location: Left Arm, Patient Position: Sitting, Cuff Size: Large)   Pulse 85   Temp 98.3 F (36.8 C) (Oral)   Resp 16   Ht 5\' 2"  (1.575 m)   Wt 242 lb (109.8 kg)   LMP 05/05/2023 (Exact Date)   SpO2 97%   BMI 44.26 kg/m   BP Readings from Last 3 Encounters:  05/07/23 122/78  02/25/23 136/88  11/21/22 134/82    Wt Readings from Last 3 Encounters:  05/07/23 242 lb (109.8 kg)  02/25/23 234 lb (106.1 kg)  11/21/22 239 lb (108.4 kg)    Physical Exam Vitals reviewed.  Constitutional:      General: She is not in acute distress.    Appearance: She is obese. She is not toxic-appearing or diaphoretic.  HENT:     Nose: Nose normal.     Mouth/Throat:     Mouth: Mucous membranes are moist.  Eyes:     General: No scleral icterus.    Conjunctiva/sclera: Conjunctivae normal.  Cardiovascular:     Rate and Rhythm: Normal rate and regular rhythm.     Heart sounds: No murmur heard. Pulmonary:     Effort: Pulmonary effort is normal.     Breath sounds: No stridor. No wheezing, rhonchi or rales.  Abdominal:     General: Abdomen is flat.     Palpations: There is no mass.     Tenderness: There is no abdominal tenderness. There is no guarding.     Hernia: No hernia is present.  Musculoskeletal:        General: Normal range of motion.     Cervical back: Neck supple.     Right lower leg: No edema.     Left lower leg: No edema.  Lymphadenopathy:     Cervical: No cervical adenopathy.  Skin:    General: Skin is warm and dry.  Neurological:     General: No focal deficit present.     Mental Status: She is alert. Mental status is at baseline.  Psychiatric:        Mood and Affect: Mood normal.        Behavior: Behavior normal.     Lab Results  Component Value Date   WBC 9.4 05/07/2023   HGB 12.1 05/07/2023   HCT 37.6 05/07/2023   PLT 309.0 05/07/2023   GLUCOSE 209 (H) 05/07/2023   CHOL 249 (H)  02/25/2023   TRIG (H) 05/07/2023    426.0 Triglyceride is over 400; calculations on Lipids are invalid.   HDL 29.70 (L) 02/25/2023   LDLDIRECT 77.0 02/25/2023   LDLCALC 121 (H) 04/17/2020   ALT 17 02/25/2023   AST 18 02/25/2023   NA 139  05/07/2023   K 3.7 05/07/2023   CL 100 05/07/2023   CREATININE 0.58 05/07/2023   BUN 10 05/07/2023   CO2 32 05/07/2023   TSH 1.13 02/25/2023   HGBA1C 7.7 (A) 05/07/2023   MICROALBUR <0.7 02/25/2023    No results found.  Assessment & Plan:   B12 deficiency -     CBC with Differential/Platelet; Future -     Cyanocobalamin  Type II diabetes mellitus with manifestations (HCC)- Her A1c is up to 7.7%.  Will restart the GLP-1 agonist. -     Basic metabolic panel; Future -     POCT glucose (manual entry) -     POCT glycosylated hemoglobin (Hb A1C) -     Trulicity; Inject 0.75 mg into the skin once a week.  Dispense: 4 mL; Refill: 0  Hypertriglyceridemia- She was encouraged to improve her lifestyle modifications. -     Triglycerides; Future     Follow-up: Return in about 4 months (around 09/06/2023).  Sanda Linger, MD

## 2023-05-07 NOTE — Patient Instructions (Signed)

## 2023-05-15 ENCOUNTER — Ambulatory Visit (INDEPENDENT_AMBULATORY_CARE_PROVIDER_SITE_OTHER): Payer: 59 | Admitting: Student

## 2023-05-15 ENCOUNTER — Other Ambulatory Visit (HOSPITAL_COMMUNITY)
Admission: RE | Admit: 2023-05-15 | Discharge: 2023-05-15 | Disposition: A | Discharge status: Home / Self Care | Payer: 59 | Source: Ambulatory Visit | Attending: Student | Admitting: Student

## 2023-05-15 ENCOUNTER — Encounter: Payer: Self-pay | Admitting: Student

## 2023-05-15 VITALS — BP 128/91 | HR 78 | Ht 62.0 in | Wt 241.0 lb

## 2023-05-15 DIAGNOSIS — Z01419 Encounter for gynecological examination (general) (routine) without abnormal findings: Secondary | ICD-10-CM | POA: Diagnosis not present

## 2023-05-15 DIAGNOSIS — R102 Pelvic and perineal pain unspecified side: Secondary | ICD-10-CM

## 2023-05-15 DIAGNOSIS — N644 Mastodynia: Secondary | ICD-10-CM

## 2023-05-15 DIAGNOSIS — N3941 Urge incontinence: Secondary | ICD-10-CM | POA: Diagnosis not present

## 2023-05-15 DIAGNOSIS — N951 Menopausal and female climacteric states: Secondary | ICD-10-CM

## 2023-05-15 DIAGNOSIS — L83 Acanthosis nigricans: Secondary | ICD-10-CM

## 2023-05-15 DIAGNOSIS — N898 Other specified noninflammatory disorders of vagina: Secondary | ICD-10-CM

## 2023-05-15 DIAGNOSIS — N941 Unspecified dyspareunia: Secondary | ICD-10-CM

## 2023-05-15 MED ORDER — TRETINOIN 0.05 % EX CREA: TOPICAL_CREAM | Freq: Every day | CUTANEOUS | 0 refills | Status: AC

## 2023-05-15 MED ORDER — AMMONIUM LACTATE 12 % EX CREA: 1.0000 | TOPICAL_CREAM | Freq: Two times a day (BID) | CUTANEOUS | 0 refills | Status: AC

## 2023-05-15 NOTE — Progress Notes (Signed)
Pt is new to office needing routine GYN exam. Pt states has been several years since last exam. Pt has mammo scheduled on 10/14.  Pt states week before/after her cycle she gets a "chalky" smell to vaginal area.   Pt would like to discuss perimenopause and symptoms  - hot flashes / sweats.

## 2023-05-15 NOTE — Progress Notes (Signed)
ANNUAL EXAM Patient name: Kaylee Hill MRN [id number]  Date of birth: 1980/10/23 Chief Complaint:   New Patient (Initial Visit) and Annual Exam  History of Present Illness:   Kaylee Hill is a 42 y.o. G62P1001 African-American female being seen today for a routine annual exam.  Current complaints:   Patient presents with concerns and questions regarding menopause. Patient has noticed increased irritability with family members and more frequent hot flashes. Patient also is experiencing changes in the amount of bleeding with cycles and states it varies from light to heavy each month.   Patient is also concerned for recent pain in breasts that is more localized to right side and lump that was self palpitated. Patient reports that she shared this with another provider and that they have appropriately changed her yearly mammogram to a diagnostic test.   Pt states week before/after her cycle she gets a "chalky" smell to vaginal area that concerns her, and is not associated with any other symptoms.   Also has noticed hyperpigmentation in groin area. Describes the area as if it looks "dirty" and the skin is more easily able to chaff the skin off in that area. Denies any pain or visible discharge associated with this finding.   Upon asking, patient additionally disclosed urinary incontinence and pain with penetrative sexual intercourse. Is not currently active due to severity of discomfort.     Patient's last menstrual period was 05/05/2023 (exact date).   Upstream - 05/15/23 1434       Pregnancy Intention Screening   Does the patient want to become pregnant in the next year? No    Does the patient's partner want to become pregnant in the next year? No    Would the patient like to discuss contraceptive options today? No      Contraception Wrap Up   Current Method No Method - Other Reason             Last pap reported in 2020. Results were: negative per patient report. H/O  abnormal pap: no Last mammogram: 2023. Results were: normal. Family h/o breast cancer: yes maternal grandmother Last colonoscopy: n/a. Results were: N/A. Family h/o colorectal cancer: no     02/25/2023    9:51 AM 09/24/2022    9:02 AM 04/15/2022    2:01 PM 01/17/2020    9:45 AM 09/17/2019    4:08 PM  Depression screen PHQ 2/9  Decreased Interest 0 0 2 0 0  Down, Depressed, Hopeless 1 0 2 0 0  PHQ - 2 Score 1 0 4 0 0  Altered sleeping 0 0 3  0  Tired, decreased energy 0 0 3  0  Change in appetite 0 0 2  0  Feeling bad or failure about yourself  0 0 1  0  Trouble concentrating 0 0 1  0  Moving slowly or fidgety/restless 0 0 0  0  Suicidal thoughts 0 0 0  0  PHQ-9 Score 1 0 14  0  Difficult doing work/chores   Somewhat difficult  Not difficult at all         No data to display           Review of Systems:   Pertinent items are noted in HPI Denies any headaches, blurred vision, fatigue, shortness of breath, chest pain, abdominal pain, abnormal vaginal discharge/itching/odor/irritation, problems with periods, bowel movements, urination, or intercourse unless otherwise stated above. Pertinent History Reviewed:  Reviewed past medical,surgical, social and family  history.  Reviewed problem list, medications and allergies. Physical Assessment:   Vitals:   05/15/23 1422 05/15/23 1436  BP: (!) 131/90 (!) 128/91  Pulse: 76 78  Weight: 241 lb (109.3 kg)   Height: 5\' 2"  (1.575 m)   Body mass index is 44.08 kg/m.        Physical Examination:   General appearance - well appearing, and in no distress  Mental status - alert, oriented to person, place, and time  Psych:  She has a normal mood and affect  Skin - warm and dry, normal color, no suspicious lesions noted  Chest - effort normal, all lung fields clear to auscultation bilaterally  Heart - normal rate and regular rhythm  Neck:  midline trachea, no thyromegaly or nodules  Breasts - breasts appear normal, no suspicious masses,  no skin or nipple changes or axillary nodes, +tenderness bilaterally behind nipples  Abdomen - soft, nontender, nondistended, no masses or organomegaly  Pelvic - VULVA: normal appearing vulva with no masses, tenderness or lesions  VAGINA: normal appearing vagina with normal color and white, milky discharge, no lesions  CERVIX: normal appearing cervix without discharge or lesions, no CMT, + mild atrophy noted  Thin prep pap is done with HR HPV cotesting  UTERUS: difficult to assess due to habitus and discomfort s/p pelvic exam  ADNEXA: No adnexal masses or tenderness noted.  Chaperone present for exam  No results found for this or any previous visit (from the past 24 hour(s)).  Assessment & Plan:   1. Women's annual routine gynecological examination - Diagnostic MM next month - Recommend colonoscopy in 2025 - Cytology - PAP( [facility]) - Cervicovaginal ancillary only  2. Perimenopausal - Anticipatory guidance provided for progression of perimenopausal symptoms to menopause - Discussed some symptoms such as irregular periods or changes to period characteristics, vasomotor symptoms, breast pain, mood changes, insomnia, and urogenital skin integrity  - Plan for monitoring progression - Follicle stimulating hormone - Estradiol  3. Pelvic pain - Severe discomfort and cramping during pelvic exam - May consider Pelvic Floor Therapy if patient is interested - US PELVIC COMPLETE WITH TRANSVAGINAL; Future - Ambulatory referral to Urogynecology  4. Vaginal discharge - Thick, white discharge noted on exam - Cervicovaginal ancillary only  5. Urge incontinence of urine - Ambulatory referral to Urogynecology - May consider Pelvic Floor Therapy if patient is interested  6. Dyspareunia, female - May consider Pelvic Floor Therapy if patient is interested - Ambulatory referral to Urogynecology  7. Focal non-cyclical breast pain - follow-up with diagnostic mammogram due to patient reported  breast lump and pain present today on exam - Counseled that this also may be a benign finding related to perimenopausal period  8. Acanthosis nigricans - Skin changes related to ongoing T2DM - tretinoin (RETIN-A) 0.05 % cream; Apply topically at bedtime. To groin and affected areas. Do not place internally  Dispense: 45 g; Refill: 0 - ammonium lactate (AMLACTIN) 12 % cream; Apply 1 Application topically 2 (two) times daily. Switch to once per day if skin irritation occurs. Apply to affected area of groin. Do not insert internally  Dispense: 385 g; Refill: 0   Labs/procedures today:   Mammogram: diagnostic mammo and breast u/s ordered and planned for 10/14, or sooner if problems Colonoscopy: @ 45yo, or sooner if problems  Orders Placed This Encounter  Procedures   US PELVIC COMPLETE WITH TRANSVAGINAL   Follicle stimulating hormone   Estradiol   Ambulatory referral to Urogynecology  Meds:  Meds ordered this encounter  Medications   tretinoin (RETIN-A) 0.05 % cream    Sig: Apply topically at bedtime. To groin and affected areas. Do not place internally    Dispense:  45 g    Refill:  0    Order Specific Question:   Supervising Provider    Answer:   Reva Bores [[id number]]   ammonium lactate (AMLACTIN) 12 % cream    Sig: Apply 1 Application topically 2 (two) times daily. Switch to once per day if skin irritation occurs. Apply to affected area of groin. Do not insert internally    Dispense:  385 g    Refill:  0    Order Specific Question:   Supervising Provider    Answer:   Reva Bores [[id number]]    Follow-up: No follow-ups on file.  Corlis Hove, NP 05/15/2023 6:14 PM

## 2023-05-16 ENCOUNTER — Encounter: Payer: Self-pay | Admitting: Internal Medicine

## 2023-05-16 DIAGNOSIS — G4733 Obstructive sleep apnea (adult) (pediatric): Secondary | ICD-10-CM | POA: Diagnosis not present

## 2023-05-16 LAB — CERVICOVAGINAL ANCILLARY ONLY
Bacterial Vaginitis (gardnerella): NEGATIVE
Candida Glabrata: NEGATIVE
Candida Vaginitis: NEGATIVE
Chlamydia: NEGATIVE
Comment: NEGATIVE
Comment: NEGATIVE
Comment: NEGATIVE
Comment: NEGATIVE
Comment: NEGATIVE
Comment: NORMAL
Neisseria Gonorrhea: NEGATIVE
Trichomonas: NEGATIVE

## 2023-05-16 LAB — ESTRADIOL: Estradiol: 57 pg/mL

## 2023-05-16 LAB — CYTOLOGY - PAP
Comment: NEGATIVE
Diagnosis: NEGATIVE
High risk HPV: NEGATIVE

## 2023-05-16 LAB — FOLLICLE STIMULATING HORMONE: FSH: 5.8 m[IU]/mL

## 2023-05-27 ENCOUNTER — Other Ambulatory Visit: Payer: Self-pay | Admitting: Internal Medicine

## 2023-05-27 ENCOUNTER — Encounter: Payer: Self-pay | Admitting: Internal Medicine

## 2023-05-27 DIAGNOSIS — E118 Type 2 diabetes mellitus with unspecified complications: Secondary | ICD-10-CM

## 2023-05-30 ENCOUNTER — Ambulatory Visit (HOSPITAL_COMMUNITY)
Admission: RE | Admit: 2023-05-30 | Discharge: 2023-05-30 | Disposition: A | Discharge status: Home / Self Care | Payer: 59 | Source: Ambulatory Visit | Attending: Student | Admitting: Student

## 2023-05-30 DIAGNOSIS — R102 Pelvic and perineal pain: Secondary | ICD-10-CM | POA: Diagnosis not present

## 2023-06-02 ENCOUNTER — Ambulatory Visit: Payer: 59 | Admitting: Internal Medicine

## 2023-06-04 ENCOUNTER — Other Ambulatory Visit (HOSPITAL_COMMUNITY): Payer: Self-pay

## 2023-06-06 ENCOUNTER — Encounter: Payer: Self-pay | Admitting: Internal Medicine

## 2023-06-12 DIAGNOSIS — G245 Blepharospasm: Secondary | ICD-10-CM | POA: Diagnosis not present

## 2023-06-15 DIAGNOSIS — G4733 Obstructive sleep apnea (adult) (pediatric): Secondary | ICD-10-CM | POA: Diagnosis not present

## 2023-06-16 ENCOUNTER — Other Ambulatory Visit: Payer: Self-pay | Admitting: Internal Medicine

## 2023-06-16 ENCOUNTER — Ambulatory Visit
Admission: RE | Admit: 2023-06-16 | Discharge: 2023-06-16 | Disposition: A | Discharge status: Home / Self Care | Payer: 59 | Source: Ambulatory Visit | Attending: Internal Medicine | Admitting: Internal Medicine

## 2023-06-16 DIAGNOSIS — N63 Unspecified lump in unspecified breast: Secondary | ICD-10-CM

## 2023-06-16 DIAGNOSIS — N644 Mastodynia: Secondary | ICD-10-CM

## 2023-06-16 DIAGNOSIS — N6341 Unspecified lump in right breast, subareolar: Secondary | ICD-10-CM | POA: Diagnosis not present

## 2023-06-26 ENCOUNTER — Other Ambulatory Visit: Payer: Self-pay | Admitting: Internal Medicine

## 2023-06-26 DIAGNOSIS — E118 Type 2 diabetes mellitus with unspecified complications: Secondary | ICD-10-CM

## 2023-06-26 MED ORDER — TRULICITY 1.5 MG/0.5ML ~~LOC~~ SOAJ
1.5000 mg | SUBCUTANEOUS | 0 refills | Status: AC
Start: 2023-06-26 — End: ?

## 2023-07-04 ENCOUNTER — Other Ambulatory Visit: Payer: Self-pay | Admitting: Medical Genetics

## 2023-07-04 DIAGNOSIS — Z006 Encounter for examination for normal comparison and control in clinical research program: Secondary | ICD-10-CM

## 2023-07-10 DIAGNOSIS — G244 Idiopathic orofacial dystonia: Secondary | ICD-10-CM | POA: Diagnosis not present

## 2023-07-14 ENCOUNTER — Other Ambulatory Visit (HOSPITAL_COMMUNITY): Payer: Self-pay

## 2023-07-14 ENCOUNTER — Other Ambulatory Visit: Payer: Self-pay

## 2023-07-14 DIAGNOSIS — G2401 Drug induced subacute dyskinesia: Secondary | ICD-10-CM | POA: Diagnosis not present

## 2023-07-14 DIAGNOSIS — F3131 Bipolar disorder, current episode depressed, mild: Secondary | ICD-10-CM | POA: Diagnosis not present

## 2023-07-14 MED ORDER — OXCARBAZEPINE 300 MG PO TABS
ORAL_TABLET | ORAL | 3 refills | Status: AC
  Filled 2023-07-14 – 2023-08-21 (×2): qty 90, 30d supply, fill #0

## 2023-07-14 MED ORDER — LITHIUM CARBONATE 300 MG PO CAPS
300.0000 mg | ORAL_CAPSULE | Freq: Every day | ORAL | 1 refills | Status: AC
Start: 2023-07-14 — End: ?
  Filled 2023-07-14: qty 30, 30d supply, fill #0

## 2023-07-14 MED ORDER — LORAZEPAM 1 MG PO TABS
ORAL_TABLET | ORAL | 3 refills | Status: AC
Start: 2023-07-14 — End: ?
  Filled 2023-07-14 – 2023-09-04 (×2): qty 75, 30d supply, fill #0
  Filled 2023-10-02: qty 75, 30d supply, fill #1
  Filled 2023-11-04: qty 75, 30d supply, fill #2
  Filled 2023-12-12: qty 75, 30d supply, fill #3

## 2023-07-14 MED ORDER — QUETIAPINE FUMARATE 100 MG PO TABS
100.0000 mg | ORAL_TABLET | Freq: Every day | ORAL | 3 refills | Status: AC
  Filled 2023-07-14 – 2023-08-21 (×2): qty 30, 30d supply, fill #0

## 2023-07-16 ENCOUNTER — Other Ambulatory Visit (HOSPITAL_COMMUNITY): Payer: Self-pay

## 2023-07-16 DIAGNOSIS — G4733 Obstructive sleep apnea (adult) (pediatric): Secondary | ICD-10-CM | POA: Diagnosis not present

## 2023-07-28 DIAGNOSIS — Z6841 Body Mass Index (BMI) 40.0 and over, adult: Secondary | ICD-10-CM | POA: Diagnosis not present

## 2023-07-28 DIAGNOSIS — E1165 Type 2 diabetes mellitus with hyperglycemia: Secondary | ICD-10-CM | POA: Diagnosis not present

## 2023-07-28 DIAGNOSIS — M25562 Pain in left knee: Secondary | ICD-10-CM | POA: Diagnosis not present

## 2023-07-28 DIAGNOSIS — I10 Essential (primary) hypertension: Secondary | ICD-10-CM | POA: Diagnosis not present

## 2023-07-29 ENCOUNTER — Other Ambulatory Visit: Payer: Self-pay | Admitting: Internal Medicine

## 2023-07-29 DIAGNOSIS — I10 Essential (primary) hypertension: Secondary | ICD-10-CM

## 2023-07-30 ENCOUNTER — Other Ambulatory Visit (HOSPITAL_COMMUNITY): Payer: 59

## 2023-08-06 ENCOUNTER — Other Ambulatory Visit: Payer: Self-pay | Admitting: Internal Medicine

## 2023-08-06 DIAGNOSIS — E118 Type 2 diabetes mellitus with unspecified complications: Secondary | ICD-10-CM

## 2023-08-08 ENCOUNTER — Other Ambulatory Visit: Payer: Self-pay | Admitting: Internal Medicine

## 2023-08-08 DIAGNOSIS — J301 Allergic rhinitis due to pollen: Secondary | ICD-10-CM

## 2023-08-09 ENCOUNTER — Other Ambulatory Visit: Payer: 59

## 2023-08-13 ENCOUNTER — Other Ambulatory Visit: Payer: Self-pay

## 2023-08-21 ENCOUNTER — Encounter: Payer: Self-pay | Admitting: Internal Medicine

## 2023-08-21 ENCOUNTER — Other Ambulatory Visit (HOSPITAL_COMMUNITY): Payer: Self-pay

## 2023-08-21 NOTE — Telephone Encounter (Signed)
 Care team updated and letter sent for eye exam notes.

## 2023-08-29 ENCOUNTER — Other Ambulatory Visit (HOSPITAL_COMMUNITY): Payer: Medicaid Other

## 2023-09-04 ENCOUNTER — Other Ambulatory Visit (HOSPITAL_COMMUNITY): Payer: Self-pay

## 2023-09-04 ENCOUNTER — Other Ambulatory Visit: Payer: Self-pay | Admitting: Internal Medicine

## 2023-09-04 DIAGNOSIS — E118 Type 2 diabetes mellitus with unspecified complications: Secondary | ICD-10-CM

## 2023-10-02 ENCOUNTER — Other Ambulatory Visit: Payer: Self-pay

## 2023-10-03 ENCOUNTER — Other Ambulatory Visit (HOSPITAL_COMMUNITY): Payer: Self-pay

## 2023-11-04 ENCOUNTER — Other Ambulatory Visit: Payer: Self-pay

## 2023-12-01 ENCOUNTER — Encounter: Payer: Self-pay | Admitting: *Deleted

## 2023-12-11 ENCOUNTER — Other Ambulatory Visit (HOSPITAL_COMMUNITY)
Admission: RE | Admit: 2023-12-11 | Discharge: 2023-12-11 | Disposition: A | Discharge status: Home / Self Care | Payer: Self-pay | Source: Ambulatory Visit | Attending: Medical Genetics | Admitting: Medical Genetics

## 2023-12-11 DIAGNOSIS — Z006 Encounter for examination for normal comparison and control in clinical research program: Secondary | ICD-10-CM | POA: Insufficient documentation

## 2023-12-12 ENCOUNTER — Other Ambulatory Visit: Payer: Self-pay

## 2023-12-15 ENCOUNTER — Other Ambulatory Visit (HOSPITAL_COMMUNITY): Payer: Self-pay

## 2023-12-21 LAB — GENECONNECT MOLECULAR SCREEN: Genetic Analysis Overall Interpretation: NEGATIVE

## 2024-02-04 IMAGING — DX DG CHEST 2V
2 series · 2 of 2 positions shown · non-contrast
Comparison: October 11, 2021

CLINICAL DATA: Shortness of breath and cough x2 weeks

EXAM:
CHEST - 2 VIEW

[chest pa]
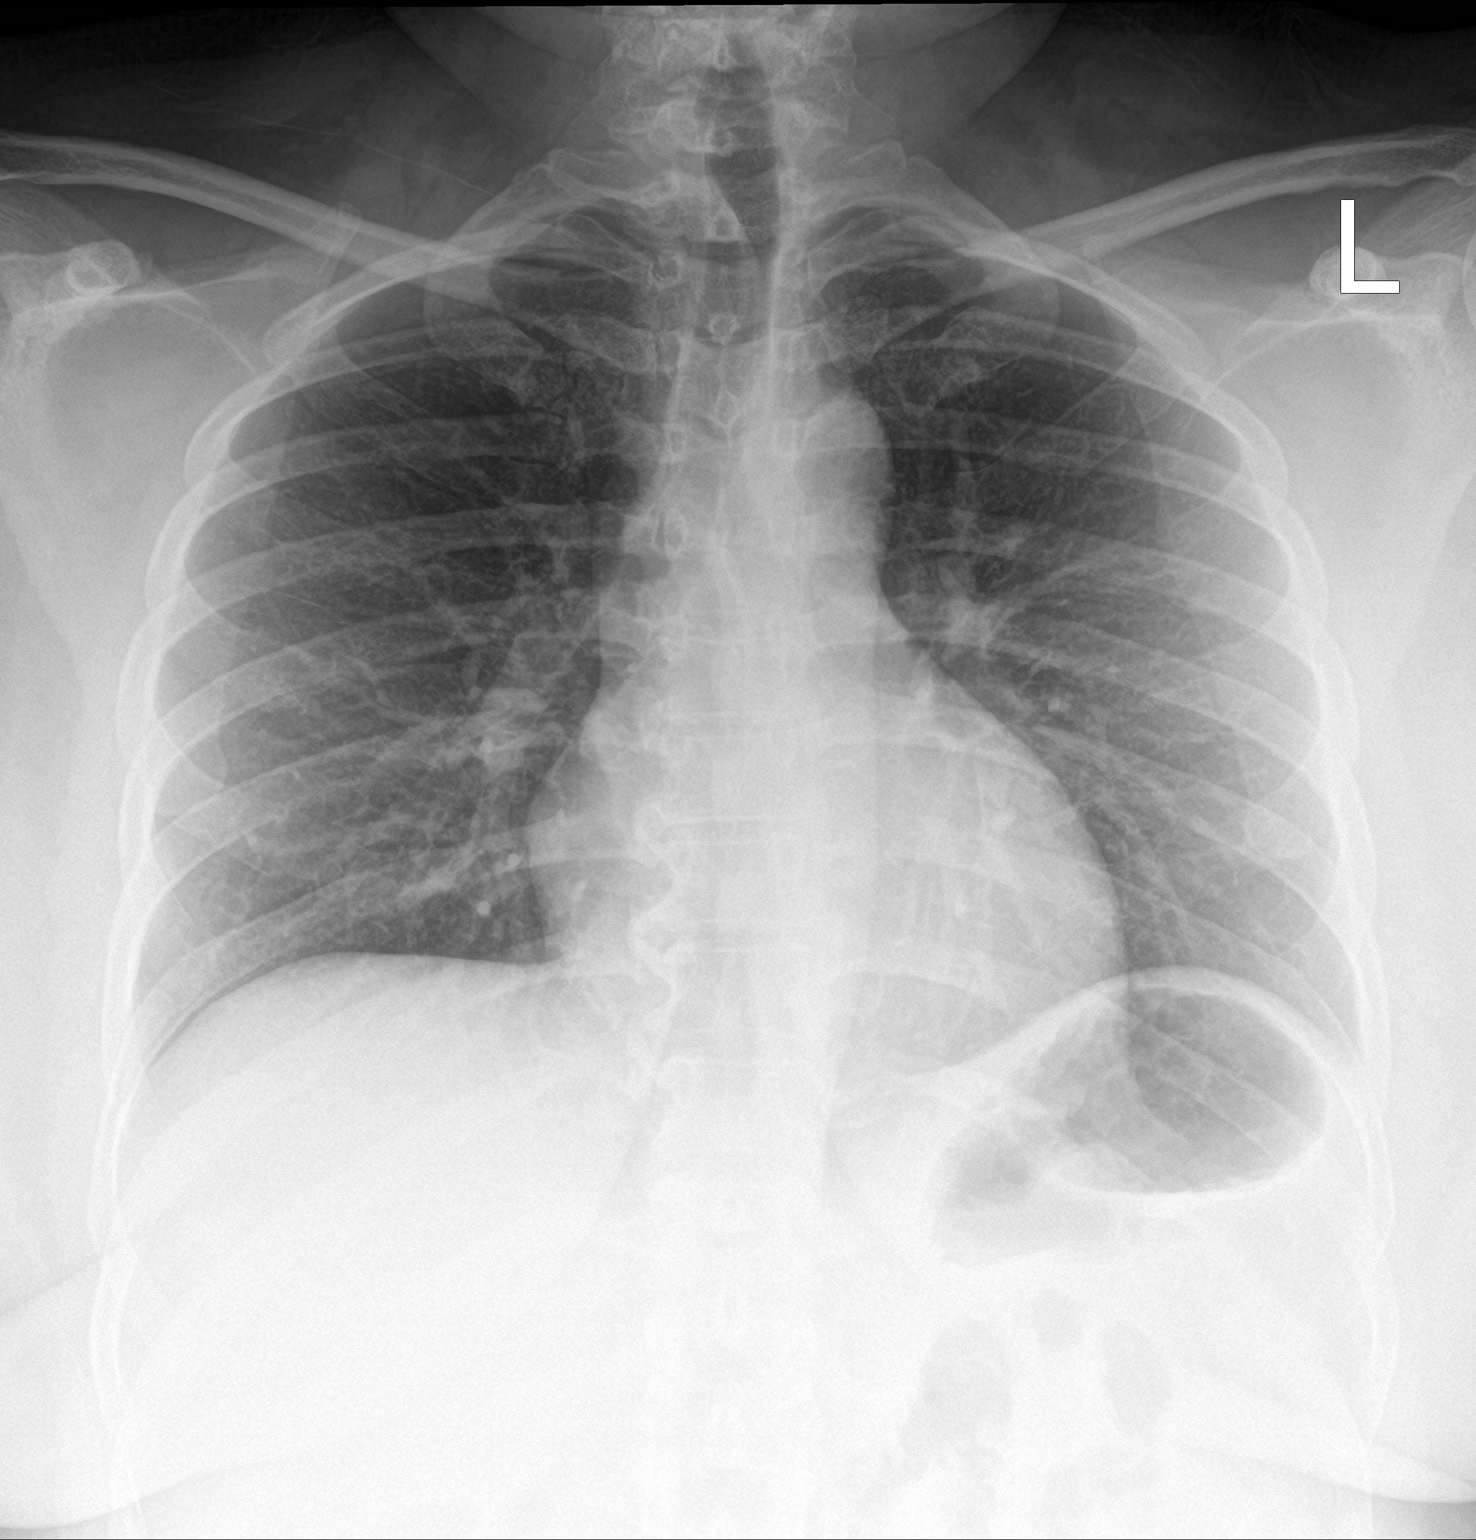

[chest lat]
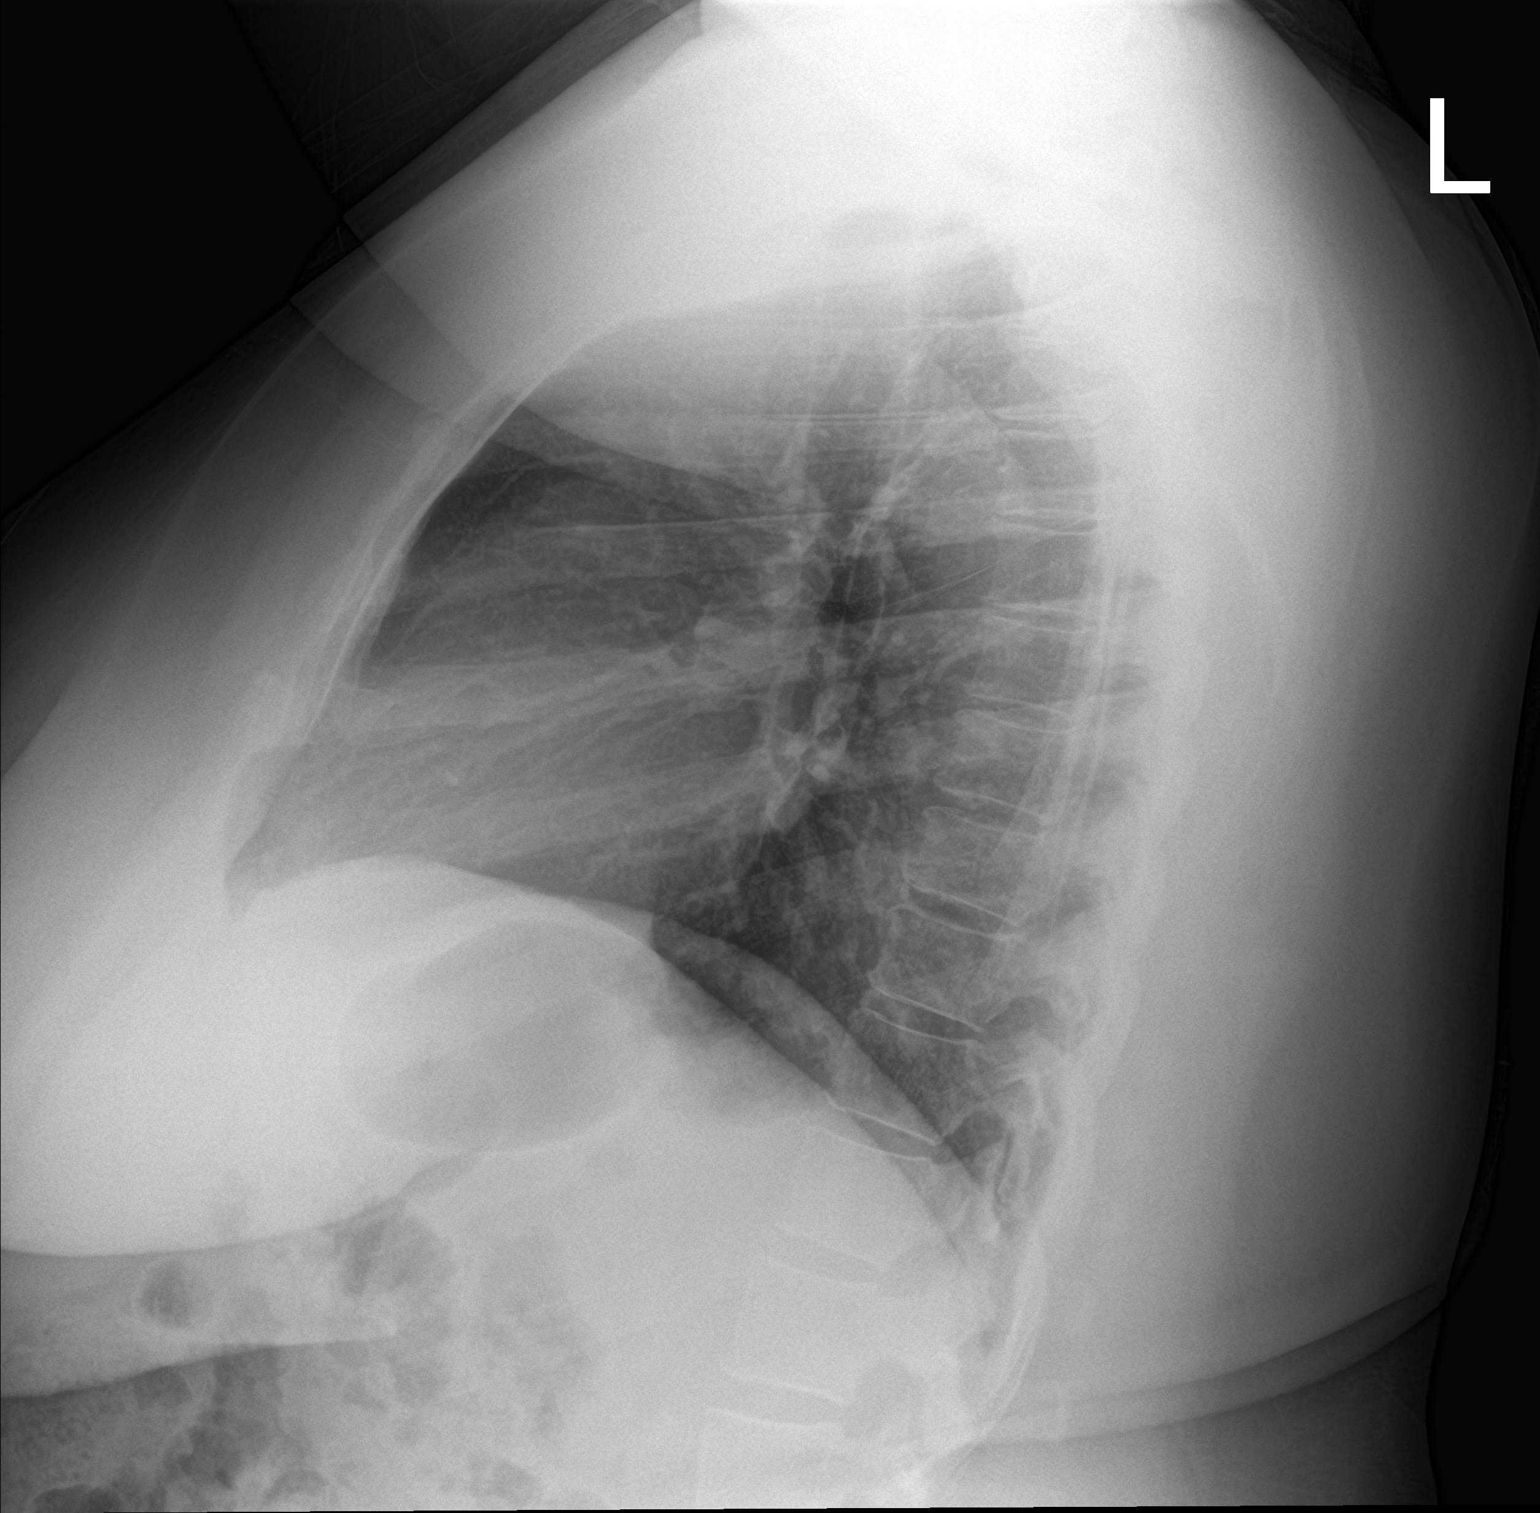

[2 of 2 positions shown; findings below may reference images not displayed]

FINDINGS: The heart size and mediastinal contours are within normal limits. No
focal consolidation. No pleural effusion. No pneumothorax. The
visualized skeletal structures are unremarkable.
IMPRESSION: No active cardiopulmonary disease.

## 2024-09-05 ENCOUNTER — Encounter (HOSPITAL_COMMUNITY): Payer: Self-pay | Admitting: Pharmacy Technician

## 2024-09-05 ENCOUNTER — Emergency Department (HOSPITAL_COMMUNITY)

## 2024-09-05 ENCOUNTER — Emergency Department (HOSPITAL_COMMUNITY)
Admission: EM | Admit: 2024-09-05 | Discharge: 2024-09-05 | Disposition: A | Discharge status: Home / Self Care | Attending: Emergency Medicine | Admitting: Emergency Medicine

## 2024-09-05 ENCOUNTER — Other Ambulatory Visit: Payer: Self-pay

## 2024-09-05 DIAGNOSIS — E119 Type 2 diabetes mellitus without complications: Secondary | ICD-10-CM | POA: Insufficient documentation

## 2024-09-05 DIAGNOSIS — Z7984 Long term (current) use of oral hypoglycemic drugs: Secondary | ICD-10-CM | POA: Insufficient documentation

## 2024-09-05 DIAGNOSIS — M7712 Lateral epicondylitis, left elbow: Secondary | ICD-10-CM | POA: Diagnosis not present

## 2024-09-05 DIAGNOSIS — Z79899 Other long term (current) drug therapy: Secondary | ICD-10-CM | POA: Diagnosis not present

## 2024-09-05 DIAGNOSIS — I10 Essential (primary) hypertension: Secondary | ICD-10-CM | POA: Diagnosis not present

## 2024-09-05 DIAGNOSIS — M25522 Pain in left elbow: Secondary | ICD-10-CM | POA: Diagnosis present

## 2024-09-05 LAB — COMPREHENSIVE METABOLIC PANEL WITH GFR
ALT: 23 U/L (ref 0–44)
AST: 26 U/L (ref 15–41)
Albumin: 4.2 g/dL (ref 3.5–5.0)
Alkaline Phosphatase: 108 U/L (ref 38–126)
Anion gap: 10 (ref 5–15)
BUN: 7 mg/dL (ref 6–20)
CO2: 28 mmol/L (ref 22–32)
Calcium: 9.7 mg/dL (ref 8.9–10.3)
Chloride: 101 mmol/L (ref 98–111)
Creatinine, Ser: 0.65 mg/dL (ref 0.44–1.00)
GFR, Estimated: >60 mL/min
Glucose, Bld: 226 mg/dL — ABNORMAL HIGH (ref 70–99)
Potassium: 4.1 mmol/L (ref 3.5–5.1)
Sodium: 138 mmol/L (ref 135–145)
Total Bilirubin: 0.3 mg/dL (ref 0.0–1.2)
Total Protein: 7.8 g/dL (ref 6.5–8.1)

## 2024-09-05 LAB — CBC WITH DIFFERENTIAL/PLATELET
Abs Immature Granulocytes: 0.03 K/uL (ref 0.00–0.07)
Basophils Absolute: 0 K/uL (ref 0.0–0.1)
Basophils Relative: 0 %
Eosinophils Absolute: 0.1 K/uL (ref 0.0–0.5)
Eosinophils Relative: 2 %
HCT: 40.8 % (ref 36.0–46.0)
Hemoglobin: 13.4 g/dL (ref 12.0–15.0)
Immature Granulocytes: 0 %
Lymphocytes Relative: 30 %
Lymphs Abs: 2.1 K/uL (ref 0.7–4.0)
MCH: 25.1 pg — ABNORMAL LOW (ref 26.0–34.0)
MCHC: 32.8 g/dL (ref 30.0–36.0)
MCV: 76.5 fL — ABNORMAL LOW (ref 80.0–100.0)
Monocytes Absolute: 0.6 K/uL (ref 0.1–1.0)
Monocytes Relative: 9 %
Neutro Abs: 4.1 K/uL (ref 1.7–7.7)
Neutrophils Relative %: 59 %
Platelets: 256 K/uL (ref 150–400)
RBC: 5.33 MIL/uL — ABNORMAL HIGH (ref 3.87–5.11)
RDW: 13.6 % (ref 11.5–15.5)
WBC: 7 K/uL (ref 4.0–10.5)
nRBC: 0 % (ref 0.0–0.2)

## 2024-09-05 NOTE — Discharge Instructions (Addendum)
 It was a pleasure meeting with you today.  As we discussed your lab work and imaging showed no acute findings.  Continue with scheduled follow-up with your primary care physician on Monday and talk to her about today's elevated blood pressure.  I have included information for a local orthopedic office, if you continue to have elbow pain I would recommend making an appointment with orthopedics for ongoing management.

## 2024-09-05 NOTE — ED Provider Notes (Signed)
 " [place] EMERGENCY DEPARTMENT AT [facility] Provider Note   CSN: [id number] Arrival date & time: 09/05/24  9173     Patient presents with: Arm Pain   Kaylee Hill is a 44 y.o. female.  With pertinent medical history including bipolar, hypertension, sickle cell trait, iron deficiency anemia, OSA, type 2 diabetes, osteoarthritis, anxiety, major depressive disorder.   Patient is here for evaluation of left elbow pain and swelling.  She first experienced left elbow pain upon waking yesterday morning.  She first noticed swelling this morning.  She has never had issues with this joint in the past.  She denies any known injuries or falls.  She does not have a history of seizures.  She reports intermittent numbness/tingling in the ring and pinky finger of the left hand which is abnormal for her.  Pain is localized to the lateral aspect of the elbow and is worsened by movement and palpation. She states she will forget about the pain then open a door and it'll hurt again. She denies recent fevers, cough, congestion, N/V/D, chest pain, or shortness of breath.  Initial blood pressure noted to be elevated.  Patient has a history of hypertension.  She denies any recent missed doses of her medication and took her medication this morning as directed.  She is scheduled to follow-up with her primary care physician on Monday regarding her blood pressure.  She goes on to say her blood pressure is always elevated at doctors offices and has been normal at home. Denies concerning symptoms.  When speaking with the patient about normal x-ray findings, she notes the other day while walking her dog she may have injured the elbow by overusing it.   The history is provided by the patient.  Arm Pain This is a new problem. The current episode started 2 days ago.       Prior to Admission medications  Medication Sig Start Date End Date Taking? Authorizing Provider  amLODipine  (NORVASC ) 5 MG tablet Take 1  tablet (5 mg total) by mouth daily. 02/13/23     ammonium lactate  (AMLACTIN) 12 % cream Apply 1 Application topically 2 (two) times daily. Switch to once per day if skin irritation occurs. Apply to affected area of groin. Do not insert internally 05/15/23   Hoyle Garre, NP  betamethasone , augmented, (DIPROLENE ) 0.05 % lotion Apply topically to the scalp once daily as needed for itching 10/07/22     botulinum toxin Type A  (BOTOX ) 100 units SOLR injection Inject 50 -100 units every 3 months 02/19/22     Continuous Blood Gluc Receiver (DEXCOM G7 RECEIVER) DEVI use as directed 08/19/22   Joshua Debby CROME, MD  Continuous Glucose Sensor (DEXCOM G7 SENSOR) MISC Use as directed 08/19/22   Joshua Debby CROME, MD  Deutetrabenazine  (AUSTEDO ) 9 MG TABS Take 9 mg by mouth 2 (two) times daily. 12/18/22     Dulaglutide  (TRULICITY ) 1.5 MG/0.5ML SOAJ Inject 1.5 mg into the skin once a week. 06/26/23   Joshua Debby CROME, MD  Ferric Maltol  (ACCRUFER ) 30 MG CAPS Take 1 capsule by mouth in the morning and at bedtime. 04/20/22   Joshua Debby CROME, MD  fluticasone  (FLONASE ) 50 MCG/ACT nasal spray Place 1 spray into both nostrils daily. 11/21/22   Meade Verdon RAMAN, MD  ketoconazole  (NIZORAL ) 2 % cream Apply 1 Application to scalp and wash once a week.  Leave in 5-10min 01/06/23     ketoconazole  (NIZORAL ) 2 % shampoo Apply 1 Application topically once a  week to scalp, leave in 5-10 minutes and rinse. 01/06/23     levocetirizine (XYZAL ) 5 MG tablet TAKE 1 TABLET EVERY EVENING 08/08/23   Joshua Debby CROME, MD  lithium  carbonate 300 MG capsule Take 1 capsule (300 mg total) by mouth daily. 07/14/23     LORazepam  (ATIVAN ) 1 MG tablet Take 1 tablet (1 mg total) by mouth 2 (two) times daily AND 0.5 tablets (0.5 mg total) daily. 04/11/23     LORazepam  (ATIVAN ) 1 MG tablet Take 1 tablet (1 mg total) by mouth 2 (two) times daily AND 0.5 tablets (0.5 mg total) daily. 07/14/23     metFORMIN  (GLUCOPHAGE -XR) 750 MG 24 hr tablet TAKE 2 TABLETS DAILY WITH  BREAKFAST 09/05/23   Joshua Debby CROME, MD  Multiple Vitamin (MULTIVITAMIN) tablet Take by mouth daily. Chew 2 per day    [provider]  nebivolol  (BYSTOLIC ) 5 MG tablet TAKE 1 TABLET BY MOUTH EVERY DAY 07/29/23   Joshua Debby CROME, MD  Oxcarbazepine  (TRILEPTAL ) 300 MG tablet Take 1 tablet (300 mg total) by mouth in the morning AND 2 tablets (600 mg total) every evening. 04/11/23     Oxcarbazepine  (TRILEPTAL ) 300 MG tablet Take 1 tablet (300 mg total) by mouth in the morning AND 2 tablets (600 mg total) every evening. 07/14/23     Probiotic Product (PROBIOTIC PO) Take by mouth daily.    [provider]  QUEtiapine  (SEROQUEL ) 100 MG tablet Take 1 tablet (100 mg total) by mouth at bedtime. 04/11/23     QUEtiapine  (SEROQUEL ) 100 MG tablet Take 1 tablet (100 mg total) by mouth at bedtime. 07/14/23     QUEtiapine  (SEROQUEL ) 200 MG tablet Take 1 tablet (200 mg total) by mouth at bedtime. 11/14/22   Joshua Debby CROME, MD  tretinoin  (RETIN-A ) 0.05 % cream Apply topically at bedtime. To groin and affected areas. Do not place internally 05/15/23   Forlan, Nicole, NP  trihexyphenidyl  (ARTANE ) 2 MG tablet Take 0.5 tablets (1 mg total) by mouth 2 (two) times daily with meals 02/13/23     zinc  gluconate 50 MG tablet Take 1 tablet (50 mg total) by mouth daily. 02/25/23   Joshua Debby CROME, MD  carbamazepine  (TEGRETOL ) 200 MG tablet Take 1 tablet by mouth twice daily for 3 days, then 1 tablet every morning and 2 tablets every evening for 3 days, then 2 tablets twice a day thereafter. 12/18/22 03/10/23      Allergies: Patient has no known allergies.    Review of Systems  Musculoskeletal:        Left elbow pain    Updated Vital Signs BP (!) 145/112   Pulse 90   Temp 99.3 F (37.4 C)   Resp 20   SpO2 98%   Physical Exam Vitals and nursing note reviewed.  Constitutional:      General: She is not in acute distress.    Appearance: Normal appearance. She is not ill-appearing, toxic-appearing or  diaphoretic.  HENT:     Head: Normocephalic and atraumatic.     Nose: Nose normal.  Eyes:     General: No scleral icterus.    Extraocular Movements: Extraocular movements intact.     Conjunctiva/sclera: Conjunctivae normal.  Cardiovascular:     Rate and Rhythm: Normal rate and regular rhythm.     Pulses: Normal pulses.  Pulmonary:     Effort: Pulmonary effort is normal. No respiratory distress.  Musculoskeletal:        General: Swelling and tenderness present. No  deformity. Normal range of motion.     Cervical back: Normal range of motion.     Comments: Swelling and tenderness with palpation over the lateral aspect of the elbow. Pain with extension of wrist.  Skin:    General: Skin is warm and dry.     Coloration: Skin is not jaundiced or pale.  Neurological:     Mental Status: She is alert and oriented to person, place, and time.     (all labs ordered are listed, but only abnormal results are displayed) Labs Reviewed  CBC WITH DIFFERENTIAL/PLATELET - Abnormal; Notable for the following components:      Result Value   RBC 5.33 (*)    MCV 76.5 (*)    MCH 25.1 (*)    All other components within normal limits  COMPREHENSIVE METABOLIC PANEL WITH GFR - Abnormal; Notable for the following components:   Glucose, Bld 226 (*)    All other components within normal limits    EKG: None  Radiology: DG Elbow Complete Left Result Date: 09/05/2024 EXAM: 3 VIEW(S) XRAY OF THE LEFT ELBOW COMPARISON: None available. CLINICAL HISTORY: Left elbow pain. Pain began yesterday upon awakening. No injury. FINDINGS: BONES AND JOINTS: No acute fracture. Degenerative change over the left elbow. No malalignment. SOFT TISSUES: The soft tissues are unremarkable. IMPRESSION: 1. No acute fracture. 2. Degenerative changes of the left elbow. Electronically signed by: Toribio Agreste MD 09/05/2024 11:05 AM EST RP Workstation: [id number]     Procedures   Medications Ordered in the ED - No data to  display   Patient presents to the ED for concern of left elbow pain and swelling, this involves an extensive number of treatment options, and is a complaint that carries with it a high risk of complications and morbidity.  The differential diagnosis includes fracture, dislocation, septic arthritis, osteoarthritis, olecranon bursitis, lateral epicondylitis.  HPI, physical exam, and vitals lower suspicion for septic arthritis. Physical exam is not indicative of olecranon bursitis. Imaging shows no fracture or dislocation.  Co morbidities that complicate the patient evaluation  Osteoarthritis Hypertension   Lab Tests:  I Ordered, and personally interpreted labs.  The pertinent results include:  No leukocytosis.   Imaging Studies ordered:  I ordered imaging studies including left elbow x ray  I independently visualized and interpreted imaging which showed no acute fractures. Degenerative change of the left elbow. I agree with the radiologist interpretation   Problem List / ED Course:     Lateral epicondylitis. Physical exam correlates with lateral epicondylitis. Cannot exclude osteoarthritis. Encouraged RICE therapy and OTC pain medications. Hypertension. Denies acute symptoms. Patient is not concerned with this finding as It is always high in doctor's offices. She has a scheduled appointment with primary care tomorrow. Encouraged to discuss elevated BP with PCP. Return precautions discussed and patient verbalized understanding. Stable for discharge.   Reevaluation:  After the interventions noted above, I reevaluated the patient and found that they have :improved   Dispostion:  After consideration of the diagnostic results and the patients response to treatment, I feel that the patent would benefit from supportive care in the home setting and close follow up with primary care as already scheduled on Monday. Encouraged to discuss today's HTN with PCP. Given local orthopedic  office information if pain continues. Return precautions given.  Medical Decision Making Amount and/or Complexity of Data Reviewed Labs: ordered. Radiology: ordered.   This note was produced using Electronics Engineer. While the provider has reviewed and verified all clinical information, transcription errors may remain.    Final diagnoses:  Lateral epicondylitis of left elbow    ED Discharge Orders     None          Rosina Almarie LABOR, PA-C 09/05/24 1603  "

## 2024-09-05 NOTE — ED Triage Notes (Signed)
 Pt here POV with reports of L elbow pain onset yesterday when she woke up. States she thought she slept on It wrong. Since then pain has worsened.
